# Patient Record
Sex: Female | Born: 1970
Health system: Southern US, Community
[De-identification: ages and names within clinical notes are randomized; demographics above are authoritative.]

## PROBLEM LIST (undated history)

## (undated) DIAGNOSIS — J45909 Unspecified asthma, uncomplicated: Secondary | ICD-10-CM

## (undated) DIAGNOSIS — E782 Mixed hyperlipidemia: Secondary | ICD-10-CM

## (undated) DIAGNOSIS — J309 Allergic rhinitis, unspecified: Secondary | ICD-10-CM

## (undated) DIAGNOSIS — T7840XA Allergy, unspecified, initial encounter: Secondary | ICD-10-CM

## (undated) DIAGNOSIS — R7611 Nonspecific reaction to tuberculin skin test without active tuberculosis: Secondary | ICD-10-CM

## (undated) DIAGNOSIS — R011 Cardiac murmur, unspecified: Secondary | ICD-10-CM

## (undated) DIAGNOSIS — I10 Essential (primary) hypertension: Secondary | ICD-10-CM

## (undated) DIAGNOSIS — M1990 Unspecified osteoarthritis, unspecified site: Secondary | ICD-10-CM

## (undated) DIAGNOSIS — E559 Vitamin D deficiency, unspecified: Secondary | ICD-10-CM

## (undated) HISTORY — DX: Unspecified asthma, uncomplicated: J45.909

## (undated) HISTORY — DX: Vitamin D deficiency, unspecified: E55.9

## (undated) HISTORY — DX: Mixed hyperlipidemia: E78.2

## (undated) HISTORY — DX: Nonspecific reaction to tuberculin skin test without active tuberculosis: R76.11

## (undated) HISTORY — DX: Cardiac murmur, unspecified: R01.1

## (undated) HISTORY — DX: Essential (primary) hypertension: I10

## (undated) HISTORY — DX: Allergic rhinitis, unspecified: J30.9

## (undated) HISTORY — PX: WISDOM TOOTH EXTRACTION: SHX21

## (undated) HISTORY — PX: TUBAL LIGATION: SHX77

## (undated) HISTORY — DX: Allergy, unspecified, initial encounter: T78.40XA

---

## 2003-03-28 ENCOUNTER — Other Ambulatory Visit: Admission: RE | Admit: 2003-03-28 | Discharge: 2003-03-28 | Payer: Self-pay | Admitting: Obstetrics and Gynecology

## 2003-08-30 ENCOUNTER — Encounter: Admission: RE | Admit: 2003-08-30 | Discharge: 2003-08-30 | Payer: Self-pay | Admitting: Family Medicine

## 2004-04-22 ENCOUNTER — Other Ambulatory Visit: Admission: RE | Admit: 2004-04-22 | Discharge: 2004-04-22 | Payer: Self-pay | Admitting: Obstetrics and Gynecology

## 2005-02-16 HISTORY — PX: ENDOMETRIAL ABLATION: SHX621

## 2005-04-21 ENCOUNTER — Ambulatory Visit (HOSPITAL_COMMUNITY): Admission: RE | Admit: 2005-04-21 | Discharge: 2005-04-21 | Payer: Self-pay | Admitting: Obstetrics and Gynecology

## 2005-10-03 ENCOUNTER — Emergency Department (HOSPITAL_COMMUNITY): Admission: EM | Admit: 2005-10-03 | Discharge: 2005-10-03 | Payer: Self-pay | Admitting: Emergency Medicine

## 2007-03-22 ENCOUNTER — Emergency Department (HOSPITAL_COMMUNITY): Admission: EM | Admit: 2007-03-22 | Discharge: 2007-03-22 | Payer: Self-pay | Admitting: Emergency Medicine

## 2007-06-18 ENCOUNTER — Emergency Department (HOSPITAL_COMMUNITY): Admission: EM | Admit: 2007-06-18 | Discharge: 2007-06-18 | Payer: Self-pay | Admitting: Emergency Medicine

## 2008-03-04 ENCOUNTER — Inpatient Hospital Stay (HOSPITAL_COMMUNITY): Admission: EM | Admit: 2008-03-04 | Discharge: 2008-03-06 | Payer: Self-pay | Admitting: Emergency Medicine

## 2008-03-04 ENCOUNTER — Ambulatory Visit: Payer: Self-pay | Admitting: Cardiology

## 2008-03-05 ENCOUNTER — Encounter (INDEPENDENT_AMBULATORY_CARE_PROVIDER_SITE_OTHER): Payer: Self-pay | Admitting: Obstetrics and Gynecology

## 2008-04-05 ENCOUNTER — Emergency Department (HOSPITAL_COMMUNITY): Admission: EM | Admit: 2008-04-05 | Discharge: 2008-04-05 | Payer: Self-pay | Admitting: Emergency Medicine

## 2009-07-23 ENCOUNTER — Emergency Department (HOSPITAL_COMMUNITY): Admission: EM | Admit: 2009-07-23 | Discharge: 2009-07-23 | Payer: Self-pay | Admitting: Emergency Medicine

## 2009-11-04 ENCOUNTER — Emergency Department (HOSPITAL_COMMUNITY)
Admission: EM | Admit: 2009-11-04 | Discharge: 2009-11-04 | Payer: Self-pay | Source: Home / Self Care | Admitting: Emergency Medicine

## 2010-02-20 ENCOUNTER — Ambulatory Visit (HOSPITAL_COMMUNITY)
Admission: RE | Admit: 2010-02-20 | Discharge: 2010-02-20 | Payer: Self-pay | Source: Home / Self Care | Attending: Sports Medicine | Admitting: Sports Medicine

## 2010-03-29 ENCOUNTER — Inpatient Hospital Stay (INDEPENDENT_AMBULATORY_CARE_PROVIDER_SITE_OTHER)
Admission: RE | Admit: 2010-03-29 | Discharge: 2010-03-29 | Disposition: A | Discharge status: Home / Self Care | Payer: 59 | Source: Ambulatory Visit | Attending: Emergency Medicine | Admitting: Emergency Medicine

## 2010-03-29 DIAGNOSIS — J019 Acute sinusitis, unspecified: Secondary | ICD-10-CM

## 2010-03-29 LAB — POCT RAPID STREP A (OFFICE): Streptococcus, Group A Screen (Direct): NEGATIVE

## 2010-05-04 ENCOUNTER — Emergency Department (HOSPITAL_COMMUNITY): Payer: 59

## 2010-05-04 ENCOUNTER — Emergency Department (HOSPITAL_COMMUNITY)
Admission: EM | Admit: 2010-05-04 | Discharge: 2010-05-04 | Disposition: A | Discharge status: Home / Self Care | Payer: 59 | Attending: Emergency Medicine | Admitting: Emergency Medicine

## 2010-05-04 DIAGNOSIS — R110 Nausea: Secondary | ICD-10-CM | POA: Insufficient documentation

## 2010-05-04 DIAGNOSIS — K7689 Other specified diseases of liver: Secondary | ICD-10-CM | POA: Insufficient documentation

## 2010-05-04 DIAGNOSIS — R1032 Left lower quadrant pain: Secondary | ICD-10-CM | POA: Insufficient documentation

## 2010-05-04 DIAGNOSIS — I10 Essential (primary) hypertension: Secondary | ICD-10-CM | POA: Insufficient documentation

## 2010-05-04 LAB — BASIC METABOLIC PANEL
BUN: 13 mg/dL (ref 6–23)
Creatinine, Ser: 0.97 mg/dL (ref 0.4–1.2)
GFR calc Af Amer: >60 mL/min (ref >60)

## 2010-05-04 LAB — URINALYSIS, ROUTINE W REFLEX MICROSCOPIC
Bilirubin Urine: NEGATIVE
Glucose, UA: NEGATIVE mg/dL
Specific Gravity, Urine: 1.016 (ref 1.005–1.030)
Urobilinogen, UA: 0.2 mg/dL (ref 0.0–1.0)

## 2010-05-04 LAB — CBC
Hemoglobin: 12.3 g/dL (ref 12.0–15.0)
MCV: 87.4 fL (ref 78.0–100.0)
RBC: 4.12 MIL/uL (ref 3.87–5.11)
WBC: 5.5 10*3/uL (ref 4.0–10.5)

## 2010-05-04 LAB — DIFFERENTIAL
Eosinophils Absolute: 0 10*3/uL (ref 0.0–0.7)
Lymphocytes Relative: 37 % (ref 12–46)
Lymphs Abs: 2 10*3/uL (ref 0.7–4.0)
Monocytes Absolute: 0.4 10*3/uL (ref 0.1–1.0)
Neutro Abs: 3 10*3/uL (ref 1.7–7.7)

## 2010-05-04 LAB — POCT PREGNANCY, URINE: Preg Test, Ur: NEGATIVE

## 2010-05-05 LAB — POCT PREGNANCY, URINE: Preg Test, Ur: NEGATIVE

## 2010-05-05 LAB — POCT URINALYSIS DIP (DEVICE)
Glucose, UA: NEGATIVE mg/dL
Urobilinogen, UA: 0.2 mg/dL (ref 0.0–1.0)
pH: 5 (ref 5.0–8.0)

## 2010-06-02 LAB — CARDIAC PANEL(CRET KIN+CKTOT+MB+TROPI)
CK, MB: 1.1 ng/mL (ref 0.3–4.0)
Relative Index: 0.8 (ref 0.0–2.5)
Total CK: 134 U/L (ref 7–177)

## 2010-06-02 LAB — BASIC METABOLIC PANEL
Calcium: 8.7 mg/dL (ref 8.4–10.5)
GFR calc non Af Amer: >60 mL/min (ref >60)
Potassium: 3.9 mEq/L (ref 3.5–5.1)
Sodium: 134 mEq/L — ABNORMAL LOW (ref 135–145)

## 2010-06-02 LAB — POCT I-STAT, CHEM 8
BUN: 12 mg/dL (ref 6–23)
HCT: 42 % (ref 36.0–46.0)
Hemoglobin: 14.3 g/dL (ref 12.0–15.0)
Sodium: 139 mEq/L (ref 135–145)
TCO2: 19 mmol/L (ref 0–100)

## 2010-06-02 LAB — CBC
HCT: 38.8 % (ref 36.0–46.0)
Hemoglobin: 12.9 g/dL (ref 12.0–15.0)
Platelets: 131 10*3/uL — ABNORMAL LOW (ref 150–400)
RDW: 12.9 % (ref 11.5–15.5)
WBC: 3.8 10*3/uL — ABNORMAL LOW (ref 4.0–10.5)

## 2010-06-02 LAB — DIFFERENTIAL
Basophils Relative: 0 % (ref 0–1)
Eosinophils Relative: 0 % (ref 0–5)
Lymphs Abs: 1.7 10*3/uL (ref 0.7–4.0)
Monocytes Relative: 6 % (ref 3–12)

## 2010-06-02 LAB — POCT CARDIAC MARKERS
CKMB, poc: 1.2 ng/mL (ref 1.0–8.0)
Myoglobin, poc: 46.9 ng/mL (ref 12–200)
Troponin i, poc: <0.05 ng/mL (ref 0.00–0.09)

## 2010-06-02 LAB — D-DIMER, QUANTITATIVE: D-Dimer, Quant: 0.31 ug/mL-FEU (ref 0.00–0.48)

## 2010-06-02 LAB — TROPONIN I: Troponin I: 0.01 ng/mL (ref 0.00–0.06)

## 2010-06-02 LAB — PREGNANCY, URINE: Preg Test, Ur: NEGATIVE

## 2010-06-03 LAB — COMPREHENSIVE METABOLIC PANEL
AST: 40 U/L — ABNORMAL HIGH (ref 0–37)
Albumin: 3.9 g/dL (ref 3.5–5.2)
BUN: 12 mg/dL (ref 6–23)
Calcium: 8.8 mg/dL (ref 8.4–10.5)
Chloride: 104 mEq/L (ref 96–112)
Creatinine, Ser: 0.93 mg/dL (ref 0.4–1.2)
GFR calc Af Amer: >60 mL/min (ref >60)
Total Bilirubin: 1 mg/dL (ref 0.3–1.2)
Total Protein: 8 g/dL (ref 6.0–8.3)

## 2010-06-03 LAB — CBC
HCT: 41.5 % (ref 36.0–46.0)
MCHC: 33.5 g/dL (ref 30.0–36.0)
MCV: 88.8 fL (ref 78.0–100.0)
Platelets: 131 10*3/uL — ABNORMAL LOW (ref 150–400)
RDW: 13 % (ref 11.5–15.5)
WBC: 3.5 10*3/uL — ABNORMAL LOW (ref 4.0–10.5)

## 2010-06-03 LAB — URINALYSIS, ROUTINE W REFLEX MICROSCOPIC
Bilirubin Urine: NEGATIVE
Nitrite: NEGATIVE
Specific Gravity, Urine: 1.023 (ref 1.005–1.030)
Urobilinogen, UA: 0.2 mg/dL (ref 0.0–1.0)
pH: 6 (ref 5.0–8.0)

## 2010-06-03 LAB — DIFFERENTIAL
Basophils Absolute: 0 10*3/uL (ref 0.0–0.1)
Eosinophils Relative: 0 % (ref 0–5)
Lymphocytes Relative: 41 % (ref 12–46)
Lymphs Abs: 1.4 10*3/uL (ref 0.7–4.0)
Monocytes Absolute: 0.2 10*3/uL (ref 0.1–1.0)
Monocytes Relative: 5 % (ref 3–12)
Neutro Abs: 1.9 10*3/uL (ref 1.7–7.7)

## 2010-06-03 LAB — URINE MICROSCOPIC-ADD ON

## 2010-07-01 NOTE — Discharge Summary (Signed)
Candace Walker, Candace Walker           ACCOUNT NO.:  [id number]   MEDICAL RECORD NO.:  [id number]          PATIENT TYPE:  INP   LOCATION:  2006                         FACILITY:  [facility]   PHYSICIAN:  Altha Harm, MDDATE OF BIRTH:  [birth year]   DATE OF ADMISSION:  03/04/2008  DATE OF DISCHARGE:  03/06/2008                               DISCHARGE SUMMARY   DISCHARGE DISPOSITION:  Home.   FINAL DISCHARGE DIAGNOSES:  1. Chest pain, noncardiac.  2. Osteopenia.   DISCHARGE MEDICATIONS:  Calcium 1 tab p.o. daily, vitamin D 1 tab p.o.  daily.   PROCEDURES:  None.   DIAGNOSTIC STUDIES:  1. Stress Myoview performed by [facility] was negative for any      ischemia.  2. Two-view chest x-ray which shows no acute chest disease.   CONSULTANTS:  [facility].   CODE STATUS:  Full code.   ALLERGIES:  PENICILLIN.   CHIEF COMPLAINT:  Chest pain.   HISTORY OF PRESENT ILLNESS:  Please see the H and P dictated by Dr.  Tamsen Roers for details of the HPI.   HOSPITAL COURSE:  The patient was admitted with complaints of chest  pain.  She was ruled out for resting ischemia with serial cardiac  enzymes which were all negative.  The patient then underwent a stress  test for further risk stratification and was found to be negative.  The  patient had no further chest pain while hospitalized.  The patient is  being discharged home.  She can return to work without restriction  immediately.  The patient is to follow up with her primary care  physician, Dr. Dorothyann Peng, on a p.r.n. basis.   DIETARY RESTRICTIONS:  None.   Total time spent 27 mins      Altha Harm, MD  Electronically Signed     MAM/MEDQ  D:  03/06/2008  T:  03/06/2008  Job:  [id number]   cc:   Candyce Churn. Allyne Gee, M.D.

## 2010-07-01 NOTE — H&P (Signed)
NAMEDOLORAS, TELLADO           ACCOUNT NO.:  [id number]   MEDICAL RECORD NO.:  [id number]          PATIENT TYPE:  EMS   LOCATION:  MAJO                         FACILITY:  [facility]   PHYSICIAN:  Beckey Rutter, MD  DATE OF BIRTH:  05-30-1970   DATE OF ADMISSION:  03/04/2008  DATE OF DISCHARGE:                              HISTORY & PHYSICAL   PRIMARY CARE PHYSICIAN:  Robyn N. Allyne Gee, MD   CHIEF COMPLAINT:  Chest pain.   HISTORY OF PRESENT ILLNESS:  This is a very pleasant 40 year old African  American female with no significant past history who came in today for  chest pain.  The patient is working here in the [facility] and  she came to work at 7 o'clock this morning.  She started working by  doing activity which is involved picking up some linens when she started  to feel chest pain.  The chest pain is all over her chest with radiation  to the neck and to the back.  The chest pain lasted for about 10 minutes  and associated with shortness of breath.  The patient also noticed heart  racing during the episode.  The chest pain is dull to squeezing in  nature and it is 6-7/10.  The patient was approached by the registered  nurse in [id number], who asked her to sit down and took her blood pressure.  After sitting down, she started to feel better and the chest pain  started to ease up, although she continued to feel her heart was racing.  Blood pressure as per the history at that time was found to have  systolic 163.  The patient was advised to come to the emergency room  which she did.  The patient was seen and evaluated in the emergency room  and the hospitalist service was called to further manage the patient's  condition.   PAST MEDICAL HISTORY:  The patient has a past medical history  significant for hypertension managed with diet at this time.   MEDICATION ALLERGIES:  Not known to have medication allergy.   MEDICATIONS:  None.   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   FAMILY HISTORY:  Significant for coronary artery disease on the father's  side.  Her father had myocardial infarction at age of 100 and he is now  status post CABG.  From the mother's side, the family history is  significant for diabetes and hyperlipidemia.   REVIEW OF SYSTEMS:  A 12-point review of systems is as per HPI,  otherwise review of system is unremarkable.   PHYSICAL EXAMINATION:  HEAD:  Atraumatic and normocephalic.  EYES:  PERRL.  MOUTH:  Moist.  No ulcer.  NECK:  Supple.  No JVD.  PRECORDIUM:  First and second heart sounds audible.  No added sound  appreciated.  LUNGS:  Significant for normal breath sounds bilaterally.  No  adventitious sounds, wheezes, or rhonchi appreciated.  ABDOMEN:  Soft and nontender.  Bowel sounds present.  EXTREMITIES:  No lower extremity edema.  NEUROLOGIC:  Alert and  oriented x3 moving all her extremities  spontaneously.  GENITOURINARY:  No CVA tenderness.   LABORATORY DATA:  HCG for pregnancy is negative.  D-dimer is 0.31, which  is normal.  White blood count is 4.7, hemoglobin 13.6, hematocrit is  40.3, and platelet count is 126.  Cardiac markers showing troponin less  than 0.05, myoglobin is 44.6, and CK-MB is 1.1.  Sodium is 139,  potassium 3.4, chloride 109, glucose is 111, BUN is 12, and creatinine  is 1.0.  Chest x-ray is essentially normal.  No evidence of acute  cardiopulmonary disease and heart size is normal.   ASSESSMENT AND PLAN:  A very pleasant 40 year old female with chest  pain.   PLAN:  1. The patient will be admitted for further assessment and management.  2. The patient will have cardiac enzyme every 8 hours and EKG tracings      every 8 hours.  3. I will go ahead and obtain a 2-D echo during this hospital stay.  4. The patient will be given aspirin today and we will put the patient      on Lovenox for DVT prophylaxis.  5. For GI prophylaxis, I will  start the patient on Protonix.  6. The patient will be admitted to telemetry monitor.      Beckey Rutter, MD  Electronically Signed     EME/MEDQ  D:  03/04/2008  T:  03/04/2008  Job:  [id number]   cc:   Candyce Churn. Allyne Gee, M.D.

## 2010-07-04 NOTE — Op Note (Signed)
NAMEMARYLAND, Candace Walker           ACCOUNT NO.:  [id number]   MEDICAL RECORD NO.:  [id number]          PATIENT TYPE:  AMB   LOCATION:  SDC                           FACILITY:  [facility]   PHYSICIAN:  Dineen Kid. Rana Snare, M.D.    DATE OF BIRTH:  07/02/1970   DATE OF PROCEDURE:  04/21/2005  DATE OF DISCHARGE:                                 OPERATIVE REPORT   PREOPERATIVE DIAGNOSES:  1.  Desires sterility.  2.  Abnormal uterine bleeding with menorrhagia and endometrial mass on      saline infusion ultrasound.   POSTOPERATIVE DIAGNOSES:  1.  Desires sterility.  2.  Abnormal uterine bleeding with menorrhagia and endometrial mass on      saline infusion ultrasound.   PROCEDURE:  Laparoscopic bilateral tubal ligation, hysteroscopy D&C with  NovaSure endometrial ablation.   SURGEON:  Dr. Candice Camp   ASSISTANT:  None.   INDICATIONS:  Candace Walker is a 40 year old, G2, P1, A1 with desired  sterility.  She also has been on hormonal contraception for menorrhagia.  She underwent saline infusion ultrasound which shows multiple intramural  fibroids and a small endometrial mass consistent with either a fibroid or  early polyp.  She presents for definitive surgical intervention and desires  stress D&C with NovaSure endometrial ablation, possible resection of  endometrial mass, and laparoscopic bilateral tubal ligation.  The risks and  benefits of these procedures were discussed at length which include but not  limited to risk of infection; bleeding; damage to uterus, tubes, ovaries,  bowel, bladder, possible risk this may not alleviate, the bleeding may recur  or worsen, tubal ligation failure quoted as 5:1000.  She does give her  informed consent and wishes to proceed.Marland Kitchen   FINDINGS AT THE TIME OF SURGERY:  Irregular endometrial surface, no obvious  polyp or submucosal fibroid was noted, normal-appearing ostia.  After the  NovaSure endometrial ablation, D&C, normal-appearing endometrial cavity with  no evidence of submucosal fibroid noted.  On laparoscopy, had normal-  appearing liver.  The appendix was retrocecal, normal-appearing uterus,  tubes, and ovaries.   DESCRIPTION OF PROCEDURE:  After adequate analgesia, the patient was placed  in the dorsal lithotomy position.  She is sterilely prepped and draped, the  bladder sterilely drained.  Weighted speculum was placed, tenaculum placed  on the anterior lip of the cervix.  The uterus was sounded to 9 cm.  The  cervical length sounded to 4 cm.  The cervix was dilated to a #27 Pratt  dilator.  Hysteroscope was inserted; the above findings were noted.  Gentle  curettage was performed, retrieving small fragments of endometrium.  No  submucosal fibroids were noted.  The NovaSure instrument was inserted  according to manufacturer's recommendations.  The cervical width was 3.8 cm  and after proper seating and test, NovaSure was carried out for 65 seconds  with power of 105.  The instrument was removed, and reexamination with  hysteroscope revealed good thermal burn throughout the entire cavity, normal-  appearing ostia.  A Cowen tenaculum was then placed; legs were repositioned.  Infraumbilical skin incision was made; a Veress needle  was inserted, and the  abdomen was insufflated to dullness to percussion.  An 11 mm trocar was then  inserted, and the above findings were noted by laparoscope.  The left  fallopian tube was identified by the fimbriated end, mid portion of the tube  grasped; cauterization was carried out over approximately 2-3 cm section of  the tube with good thermal burn noted visually and loss of resistance by the  Oh meter.  The right fallopian tube was identified by the fimbriated end,  mid portion of the tube grasped; cauterization was carried out over a 2-3 cm  section of the tube with good thermal burn noted and loss of resistance on  the Oh meter after careful examination of the pelvis and fallopian tubes  after good  technique was noted to be applied.  The abdomen was then  desufflated, the trocar removed.  The infraumbilical skin incision was  closed with a 0 Vicryl in an interrupted suture into the fascia and a 3-0  Vicryl Rapide subcuticular suture with good approximation, good hemostasis.  The incision was injected with 0.25% Marcaine, 10 mL total used.  The  tenaculum was removed from the cervix, noted to be hemostatic.  The patient  was stable on transfer to the recovery room.  Sponge, needle, and instrument  count was normal x3.  Estimated blood loss was minimal.  The sorbitol  deficit was 125.   DISPOSITION:  The patient will be discharged home with follow up in the  office in 2-3 weeks, sent home with emergency instruction sheet for [facility] and  laparoscopy, sent home with a prescription for Darvocet #20, told to return  for increased pain, fever, or bleeding.  To continue her current method of  contraception until her next menstrual cycle.      Dineen Kid Rana Snare, M.D.  Electronically Signed     DCL/MEDQ  D:  04/21/2005  T:  04/21/2005  Job:  [id number]

## 2010-10-29 ENCOUNTER — Emergency Department (HOSPITAL_COMMUNITY)
Admission: EM | Admit: 2010-10-29 | Discharge: 2010-10-29 | Disposition: A | Discharge status: Home / Self Care | Payer: 59 | Attending: Emergency Medicine | Admitting: Emergency Medicine

## 2010-10-29 ENCOUNTER — Emergency Department (HOSPITAL_COMMUNITY): Payer: 59

## 2010-10-29 DIAGNOSIS — Z79899 Other long term (current) drug therapy: Secondary | ICD-10-CM | POA: Insufficient documentation

## 2010-10-29 DIAGNOSIS — R002 Palpitations: Secondary | ICD-10-CM | POA: Insufficient documentation

## 2010-10-29 DIAGNOSIS — I10 Essential (primary) hypertension: Secondary | ICD-10-CM | POA: Insufficient documentation

## 2010-10-29 LAB — MAGNESIUM: Magnesium: 2.1 mg/dL (ref 1.5–2.5)

## 2010-10-29 LAB — TSH: TSH: 1.13 u[IU]/mL (ref 0.350–4.500)

## 2010-10-29 LAB — CBC
HCT: 37.9 % (ref 36.0–46.0)
Hemoglobin: 13.5 g/dL (ref 12.0–15.0)
MCH: 30.7 pg (ref 26.0–34.0)
MCHC: 35.6 g/dL (ref 30.0–36.0)
MCV: 86.1 fL (ref 78.0–100.0)
Platelets: 154 10*3/uL (ref 150–400)
RBC: 4.4 MIL/uL (ref 3.87–5.11)
RDW: 12.6 % (ref 11.5–15.5)
WBC: 5.8 10*3/uL (ref 4.0–10.5)

## 2010-10-29 LAB — BASIC METABOLIC PANEL
BUN: 11 mg/dL (ref 6–23)
CO2: 27 mEq/L (ref 19–32)
Calcium: 9.9 mg/dL (ref 8.4–10.5)
Chloride: 99 mEq/L (ref 96–112)
Creatinine, Ser: 0.91 mg/dL (ref 0.50–1.10)
GFR calc Af Amer: >60 mL/min (ref >60)
GFR calc non Af Amer: >60 mL/min (ref >60)
Glucose, Bld: 93 mg/dL (ref 70–99)
Potassium: 3.5 mEq/L (ref 3.5–5.1)
Sodium: 136 mEq/L (ref 135–145)

## 2010-10-29 LAB — DIFFERENTIAL
Basophils Absolute: 0 10*3/uL (ref 0.0–0.1)
Basophils Relative: 0 % (ref 0–1)
Eosinophils Absolute: 0 10*3/uL (ref 0.0–0.7)
Eosinophils Relative: 0 % (ref 0–5)
Lymphocytes Relative: 36 % (ref 12–46)
Lymphs Abs: 2.1 10*3/uL (ref 0.7–4.0)
Monocytes Absolute: 0.3 10*3/uL (ref 0.1–1.0)
Monocytes Relative: 6 % (ref 3–12)
Neutro Abs: 3.4 10*3/uL (ref 1.7–7.7)
Neutrophils Relative %: 58 % (ref 43–77)

## 2010-10-29 LAB — POCT I-STAT TROPONIN I: Troponin i, poc: 0 ng/mL (ref 0.00–0.08)

## 2010-12-01 ENCOUNTER — Encounter: Payer: Self-pay | Admitting: Family Medicine

## 2010-12-01 ENCOUNTER — Ambulatory Visit (INDEPENDENT_AMBULATORY_CARE_PROVIDER_SITE_OTHER): Payer: 59 | Admitting: Family Medicine

## 2010-12-01 DIAGNOSIS — J309 Allergic rhinitis, unspecified: Secondary | ICD-10-CM

## 2010-12-01 DIAGNOSIS — J45909 Unspecified asthma, uncomplicated: Secondary | ICD-10-CM

## 2010-12-01 DIAGNOSIS — Z0000 Encounter for general adult medical examination without abnormal findings: Secondary | ICD-10-CM

## 2010-12-01 DIAGNOSIS — E782 Mixed hyperlipidemia: Secondary | ICD-10-CM

## 2010-12-01 DIAGNOSIS — I10 Essential (primary) hypertension: Secondary | ICD-10-CM | POA: Insufficient documentation

## 2010-12-01 DIAGNOSIS — E559 Vitamin D deficiency, unspecified: Secondary | ICD-10-CM

## 2010-12-01 LAB — POCT URINALYSIS DIPSTICK
Glucose, UA: NEGATIVE
Nitrite, UA: NEGATIVE
Protein, UA: NEGATIVE
Urobilinogen, UA: NEGATIVE

## 2010-12-01 MED ORDER — ALBUTEROL SULFATE HFA 108 (90 BASE) MCG/ACT IN AERS: 2.0000 | INHALATION_SPRAY | Freq: Four times a day (QID) | RESPIRATORY_TRACT | Status: DC | PRN

## 2010-12-01 MED ORDER — LISINOPRIL-HYDROCHLOROTHIAZIDE 20-12.5 MG PO TABS: 1.0000 | ORAL_TABLET | Freq: Every day | ORAL | Status: DC

## 2010-12-01 NOTE — Progress Notes (Signed)
Candace Walker is a 40 y.o. female who presents for a complete physical.  She has appt with her GYN in November. She has the following concerns: Sometimes has allergic reaction to flowers (that are in patient's room at work), sometimes requiring her to go to Urgent Care for nebulizer treatments.  This occurs despite her taking Zyrtec daily, and having an inhaler to use as needed.  Has reactions at least 3 times a year, but not always requiring UC treatment.  She would like new FMLA form on file (has previously gotten).  Follow up on HTN:  Lately, BP's have been running 130-145/mid-80's.  Denies headaches, dizziness, chest pain, edema She had labs done through [facility], and recalls that her cholesterol was high.  She is trying to limit cholesterol and sodium in her diet.  Went on a "diet" last week to try and lose weight.    There is no immunization history on file for this patient. She states immunizations are UTD.  Got flu shot at [facility].  Got tetanus at [facility], but can't recall date or type Last Pap smear: 01/2010 Last mammogram: 01/2010 Last colonoscopy: never Last DEXA: never Ophtho: yearly Dentist: every 6 months Exercise: 3 times/week goes to gym (elliptical, weights, crunches)  Past Medical History  Diagnosis Date  . Allergic rhinitis, cause unspecified   . Asthma with allergic rhinitis   . Essential hypertension, benign   . Mixed hyperlipidemia   . Unspecified vitamin D deficiency   . Positive PPD     h/o BCG vaccine    Past Surgical History  Procedure Date  . Tubal ligation   . Endometrial ablation     Dr. Rana Snare    History   Social History  . Marital Status: Legally Separated    Spouse Name: N/A    Number of Children: 2  . Years of Education: N/A   Occupational History  . Nurse [employer]   Social History Main Topics  . Smoking status: Never Smoker   . Smokeless tobacco: Never Used  . Alcohol Use: Yes     occasional (once a month)  .  Drug Use: No  . Sexually Active: Not Currently   Other Topics Concern  . Not on file   Social History Narrative   Lives with her son; no pets. Separated from her husband.  Daughter is in the army, living in [place]    Family History  Problem Relation Age of Onset  . Diabetes Mother   . Hypertension Mother   . Allergies Mother   . Heart disease Father 82    MI  . Hyperlipidemia Father   . Hypertension Father   . Cancer Paternal Grandmother     breast cancer in 70's   Current outpatient prescriptions:acetaminophen (TYLENOL) 500 MG tablet, Take 500 mg by mouth every 6 (six) hours as needed.  , Disp: , Rfl: ;  cetirizine (ZYRTEC) 10 MG tablet, Take 10 mg by mouth daily.  , Disp: , Rfl: ;  cholecalciferol (VITAMIN D) 1000 UNITS tablet, Take 1,000 Units by mouth daily.  , Disp: , Rfl: ;  fish oil-omega-3 fatty acids 1000 MG capsule, Take 1 g by mouth daily.  , Disp: , Rfl:  Multiple Vitamins-Minerals (MULTIVITAMIN WITH MINERALS) tablet, Take 1 tablet by mouth daily.  , Disp: , Rfl: ;  albuterol (PROVENTIL HFA;VENTOLIN HFA) 108 (90 BASE) MCG/ACT inhaler, Inhale 2 puffs into the lungs every 6 (six) hours as needed for wheezing., Disp: 18  g, Rfl: 1;  lisinopril-hydrochlorothiazide (PRINZIDE,ZESTORETIC) 20-12.5 MG per tablet, Take 1 tablet by mouth daily., Disp: 90 tablet, Rfl: 1  Allergies  Allergen Reactions  . Penicillins Hives  . Percocet (Oxycodone-Acetaminophen) Itching   ROS: The patient denies anorexia, fever, weight changes, headaches,  vision changes, decreased hearing, ear pain, sore throat, breast concerns, chest pain,  dizziness, syncope, dyspnea on exertion, cough, swelling, vomiting, diarrhea, constipation, abdominal pain, melena, hematochezia, indigestion/heartburn, hematuria, incontinence, dysuria, irregular menstrual cycles (they are very light), vaginal discharge, odor or itch, genital lesions, joint pains, numbness, tingling, weakness, tremor, suspicious skin lesions, depression,  anxiety, abnormal bleeding/bruising, or enlarged lymph nodes. +palpitations last month--went to ER and was told everything was fine Recalls slightly low iron last year--chart reviewed:  Had slightly low WBC in 2012, normal CBC in March and September of 2012; Hg always normal. Sometimes feels nauseated with eating (infrequent, sudden onset)  PHYSICAL EXAM: BP 132/98  Pulse 80  Temp(Src) 98.3 F (36.8 C) (Oral)  Ht 5\' 9"  (1.753 m)  Wt 236 lb (107.049 kg)  BMI 34.85 kg/m2  General Appearance:    Alert, cooperative, no distress, appears stated age  Head:    Normocephalic, without obvious abnormality, atraumatic  Eyes:    PERRL, conjunctiva/corneas clear, EOM's intact, fundi    benign  Ears:    Normal TM's and external ear canals  Nose:   Nares normal, mucosa normal, no drainage or sinus   tenderness  Throat:   Lips, mucosa, and tongue normal; teeth and gums normal  Neck:   Supple, no lymphadenopathy;  thyroid:  no   enlargement/tenderness/nodules; no carotid   bruit or JVD  Back:    Spine nontender, no curvature, ROM normal, no CVA     tenderness  Lungs:     Clear to auscultation bilaterally without wheezes, rales or     ronchi; respirations unlabored  Chest Wall:    No tenderness or deformity   Heart:    Regular rate and rhythm, S1 and S2 normal, no murmur, rub   or gallop  Breast Exam:    Deferred to GYN  Abdomen:     Soft, non-tender, nondistended, normoactive bowel sounds,    no masses, no hepatosplenomegaly  Genitalia:    Deferred to GYN     Extremities:   No clubbing, cyanosis or edema  Pulses:   2+ and symmetric all extremities  Skin:   Skin color, texture, turgor normal, no rashes or lesions  Lymph nodes:   Cervical, supraclavicular, and axillary nodes normal  Neurologic:   CNII-XII intact, normal strength, sensation and gait; reflexes 2+ and symmetric throughout          Psych:   Normal mood, affect, hygiene and grooming.     ASSESSMENT/PLAN:  1. Unspecified vitamin D  deficiency  Vitamin D 25 hydroxy  2. Mixed hyperlipidemia  Lipid panel  3. Essential hypertension, benign  Comprehensive metabolic panel, lisinopril-hydrochlorothiazide (PRINZIDE,ZESTORETIC) 20-12.5 MG per tablet  4. Asthma with allergic rhinitis  albuterol (PROVENTIL HFA;VENTOLIN HFA) 108 (90 BASE) MCG/ACT inhaler  5. Allergic rhinitis, cause unspecified    6. Physical exam, routine  POCT Urinalysis Dipstick   HTN--suboptimally controlled.  Increase lisinopril HCTZ to 20-12.5.  Check BP elsewhere, and bring list to f/u Allergies--continue daily Zyrtec Asthma--refill ProAir HFA  Hematuria--microscopic. (trace). Recalls having at GYN in the past (years ago).  If has persistent hematuria noted at GYN next month, may need further workup.  Discussed monthly self breast exams and yearly mammograms  after the age of 1; at least 30 minutes of aerobic activity at least 5 days/week; proper sunscreen use reviewed; healthy diet, including goals of calcium and vitamin D intake and alcohol recommendations (less than or equal to 1 drink/day) reviewed; regular seatbelt use; changing batteries in smoke detectors.  Immunization recommendations discussed--check employee health regarding date/type of tetanus (and we will check [facility] records when they come).  Colonoscopy recommendations reviewed--age 48

## 2010-12-01 NOTE — Patient Instructions (Addendum)
HEALTH MAINTENANCE RECOMMENDATIONS:  It is recommended that you get at least 30 minutes of aerobic exercise at least 5 days/week (for weight loss, you may need as much as 60-90 minutes). This can be any activity that gets your heart rate up. This can be divided in 10-15 minute intervals if needed, but try and build up your endurance at least once a week.  Weight bearing exercise is also recommended twice weekly.  Eat a healthy diet with lots of vegetables, fruits and fiber.  "Colorful" foods have a lot of vitamins (ie green vegetables, tomatoes, red peppers, etc).  Limit sweet tea, regular sodas and alcoholic beverages, all of which has a lot of calories and sugar.  Up to 1 alcoholic drink daily may be beneficial for women (unless trying to lose weight, watch sugars).  Drink a lot of water.  Calcium recommendations are 1200-1500 mg daily (1500 mg for postmenopausal women or women without ovaries), and vitamin D 1000 IU daily.  This should be obtained from diet and/or supplements (vitamins), and calcium should not be taken all at once, but in divided doses.  Monthly self breast exams and yearly mammograms for women over the age of 67 is recommended.  Sunscreen of at least SPF 30 should be used on all sun-exposed parts of the skin when outside between the hours of 10 am and 4 pm (not just when at beach or pool, but even with exercise, golf, tennis, and yard work!)  Use a sunscreen that says "broad spectrum" so it covers both UVA and UVB rays, and make sure to reapply every 1-2 hours.  Remember to change the batteries in your smoke detectors when changing your clock times in the spring and fall.  Use your seat belt every time you are in a car, and please drive safely and not be distracted with cell phones and texting while driving.  You had trace blood in your urine today.  You may need further evaluation for this if it persists--let me know if there is persistent blood in the urine after your visit to  your gynecologist in November.

## 2010-12-02 LAB — COMPREHENSIVE METABOLIC PANEL
ALT: 20 U/L (ref 0–35)
AST: 26 U/L (ref 0–37)
CO2: 23 mEq/L (ref 19–32)
Calcium: 9.4 mg/dL (ref 8.4–10.5)
Chloride: 104 mEq/L (ref 96–112)
Creat: 0.93 mg/dL (ref 0.50–1.10)
Sodium: 136 mEq/L (ref 135–145)
Total Bilirubin: 0.5 mg/dL (ref 0.3–1.2)
Total Protein: 7.4 g/dL (ref 6.0–8.3)

## 2010-12-02 LAB — LIPID PANEL
Total CHOL/HDL Ratio: 5.6 Ratio
VLDL: 31 mg/dL (ref 0–40)

## 2010-12-02 LAB — VITAMIN D 25 HYDROXY (VIT D DEFICIENCY, FRACTURES): Vit D, 25-Hydroxy: 31 ng/mL (ref 30–89)

## 2010-12-03 ENCOUNTER — Encounter: Payer: Self-pay | Admitting: Family Medicine

## 2010-12-03 DIAGNOSIS — E782 Mixed hyperlipidemia: Secondary | ICD-10-CM

## 2010-12-10 ENCOUNTER — Telehealth: Payer: Self-pay | Admitting: *Deleted

## 2010-12-10 NOTE — Telephone Encounter (Signed)
Left message for patient to come and pick up paperwork that Dr.Knapp filled out.

## 2010-12-15 ENCOUNTER — Encounter: Payer: Self-pay | Admitting: *Deleted

## 2010-12-25 ENCOUNTER — Encounter: Payer: Self-pay | Admitting: Family Medicine

## 2010-12-25 ENCOUNTER — Ambulatory Visit (INDEPENDENT_AMBULATORY_CARE_PROVIDER_SITE_OTHER): Payer: 59 | Admitting: Family Medicine

## 2010-12-25 VITALS — BP 130/80 | HR 72 | Temp 99.0°F | Ht 69.0 in | Wt 234.0 lb

## 2010-12-25 DIAGNOSIS — J029 Acute pharyngitis, unspecified: Secondary | ICD-10-CM

## 2010-12-25 DIAGNOSIS — R509 Fever, unspecified: Secondary | ICD-10-CM

## 2010-12-25 DIAGNOSIS — R599 Enlarged lymph nodes, unspecified: Secondary | ICD-10-CM

## 2010-12-25 MED ORDER — AZITHROMYCIN 250 MG PO TABS: ORAL_TABLET | ORAL | Status: AC

## 2010-12-25 NOTE — Patient Instructions (Signed)
Take antibiotics as prescribed--remember they continue to work for 5 days after finishing them.  Continue fever/pain control with ibuprofen Pain should improve over the next 3-4 days, although enlarged glands may take a week or two to completely go down  Follow up if ongoing pain, fevers, persistent swollen glands, or other concerns

## 2010-12-25 NOTE — Progress Notes (Signed)
Patient presents for evaluation of swollen glands on right side.  First noticed 5 days ago, but has gotten more painful over the last few days.  Also having right-sided facial pressure and pain, which started 3-4 days ago.  Ibuprofen temporarily helps reduce the pain.  Has had some low grade fevers, 99.8.  Stuffy nose, some postnasal drip.  Denies cough, shortness of breath.  Having some intermittent hoarseness.  Had sore throat 2 days ago, feeling better now.  She is taking Ibuprofen 800mg  three times a day, and having these symptoms and LG fevers despite meds. Denies sick contacts.  HTN--BP's at work is running 130/80 since dose changed.  Denies headache, chest pain.  Past Medical History  Diagnosis Date  . Allergic rhinitis, cause unspecified   . Asthma with allergic rhinitis   . Essential hypertension, benign   . Mixed hyperlipidemia   . Unspecified vitamin D deficiency   . Positive PPD     h/o BCG vaccine    Past Surgical History  Procedure Date  . Tubal ligation   . Endometrial ablation     Dr. Rana Snare    History   Social History  . Marital Status: Legally Separated    Spouse Name: N/A    Number of Children: 2  . Years of Education: N/A   Occupational History  . Nurse [employer]   Social History Main Topics  . Smoking status: Never Smoker   . Smokeless tobacco: Never Used  . Alcohol Use: Yes     occasional (once a month)  . Drug Use: No  . Sexually Active: Not Currently   Other Topics Concern  . Not on file   Social History Narrative   Lives with her son; no pets. Separated from her husband.  Daughter is in the army, living in [place]    Family History  Problem Relation Age of Onset  . Diabetes Mother   . Hypertension Mother   . Allergies Mother   . Heart disease Father 3    MI  . Hyperlipidemia Father   . Hypertension Father   . Cancer Paternal Grandmother     breast cancer in 14's   Current Outpatient Prescriptions on File Prior to Visit  Medication  Sig Dispense Refill  . albuterol (PROVENTIL HFA;VENTOLIN HFA) 108 (90 BASE) MCG/ACT inhaler Inhale 2 puffs into the lungs every 6 (six) hours as needed for wheezing.  18 g  1  . cetirizine (ZYRTEC) 10 MG tablet Take 10 mg by mouth daily.        . cholecalciferol (VITAMIN D) 1000 UNITS tablet Take 1,000 Units by mouth daily.        . fish oil-omega-3 fatty acids 1000 MG capsule Take 1 g by mouth daily.        Marland Kitchen lisinopril-hydrochlorothiazide (PRINZIDE,ZESTORETIC) 20-12.5 MG per tablet Take 1 tablet by mouth daily.  90 tablet  1  . Multiple Vitamins-Minerals (MULTIVITAMIN WITH MINERALS) tablet Take 1 tablet by mouth daily.          Allergies  Allergen Reactions  . Penicillins Hives  . Percocet (Oxycodone-Acetaminophen) Itching    ROS:  Denies nausea, vomiting, abdominal pain, diarrhea, skin rashes.  See HPI  PHYSICAL EXAM: BP 130/80  Pulse 72  Temp(Src) 99 F (37.2 C) (Oral)  Ht 5\' 9"  (1.753 m)  Wt 234 lb (106.142 kg)  BMI 34.56 kg/m2  LMP 12/15/2010 Well developed, pleasant female, in no acute distress HEENT:  PERRL, EOMI, conjunctiva clear. TM's and  EAC's normal.  OP--tonsils mildly enlarged with some erythema.  Sinuses nontender.  Nasal mucosa mildly edematous, no purulence.  Neck: +tender anterior cervical LN on left.  No overlying erythema, warmth or fluctuance Heart: regular rate and rhythm without murmur Lungs: clear bilaterally  ASSESSMENT/PLAN:  1. Lymph nodes enlarged    2. Pharyngitis  azithromycin (ZITHROMAX Z-PAK) 250 MG tablet  3. Fever     Continue fever/pain control with ibuprofen Pain should improve over the next 3-4 days, although enlarged glands may take a week or two to completely go down  Follow up if ongoing pain, fevers, persistent swollen glands, or other concerns

## 2011-01-01 ENCOUNTER — Ambulatory Visit: Payer: 59 | Admitting: Family Medicine

## 2011-05-04 ENCOUNTER — Other Ambulatory Visit: Payer: Self-pay | Admitting: Family Medicine

## 2011-05-04 ENCOUNTER — Ambulatory Visit (INDEPENDENT_AMBULATORY_CARE_PROVIDER_SITE_OTHER): Payer: 59 | Admitting: Family Medicine

## 2011-05-04 VITALS — BP 120/80 | HR 99 | Wt 238.0 lb

## 2011-05-04 DIAGNOSIS — M546 Pain in thoracic spine: Secondary | ICD-10-CM

## 2011-05-04 DIAGNOSIS — M549 Dorsalgia, unspecified: Secondary | ICD-10-CM

## 2011-05-04 MED ORDER — CARISOPRODOL 350 MG PO TABS: 350.0000 mg | ORAL_TABLET | Freq: Four times a day (QID) | ORAL | Status: DC | PRN

## 2011-05-04 NOTE — Patient Instructions (Signed)
Heat to your back for 20 minutes 3 times a day. 800 mg of ibuprofen 3 times per day the next 10 days and then let me know how you're doing. Get worse let me know sooner. Use a muscle relaxer at night and you can use it during the day but it can make you drowsy

## 2011-05-04 NOTE — Progress Notes (Signed)
  Subjective:    Patient ID: Candace Walker, female    DOB: 1971-01-16, 41 y.o.   MRN: [id number]  HPI She complains of a four-day history of right upper back pain with occasional radiation down to her forearm. Certain motions of her neck does cause upper back pain but does not radiate down her arm. She's had no weakness, numbness or tingling in her arm. She has used a little bit of heat and 800 mg of ibuprofen on occasion.   Review of Systems     Objective:   Physical Exam Full motion of her neck with no pain. No palpable tenderness of her neck. Right upper back area shows a tender area but no true trigger point. Normal motor, sensory and DTRs.      Assessment & Plan:   1. Upper back pain on right side    recommend heat, ibuprofen 800 mg 3 times a day. Will also give soma compound to be used mainly at night. She is to call if the condition worsens or call me in 10 days to let she's doing

## 2011-06-24 ENCOUNTER — Encounter: Payer: Self-pay | Admitting: Family Medicine

## 2011-06-25 ENCOUNTER — Ambulatory Visit: Payer: 59 | Admitting: Family Medicine

## 2011-07-10 ENCOUNTER — Ambulatory Visit: Payer: 59 | Admitting: Family Medicine

## 2011-07-10 ENCOUNTER — Encounter: Payer: Self-pay | Admitting: Family Medicine

## 2011-07-10 ENCOUNTER — Ambulatory Visit (INDEPENDENT_AMBULATORY_CARE_PROVIDER_SITE_OTHER): Payer: 59 | Admitting: Family Medicine

## 2011-07-10 VITALS — BP 122/82 | HR 84 | Temp 98.5°F | Ht 67.25 in | Wt 237.0 lb

## 2011-07-10 DIAGNOSIS — I10 Essential (primary) hypertension: Secondary | ICD-10-CM

## 2011-07-10 DIAGNOSIS — M791 Myalgia, unspecified site: Secondary | ICD-10-CM

## 2011-07-10 DIAGNOSIS — IMO0001 Reserved for inherently not codable concepts without codable children: Secondary | ICD-10-CM

## 2011-07-10 DIAGNOSIS — J309 Allergic rhinitis, unspecified: Secondary | ICD-10-CM

## 2011-07-10 DIAGNOSIS — J45909 Unspecified asthma, uncomplicated: Secondary | ICD-10-CM

## 2011-07-10 DIAGNOSIS — Z79899 Other long term (current) drug therapy: Secondary | ICD-10-CM

## 2011-07-10 LAB — BASIC METABOLIC PANEL
BUN: 14 mg/dL (ref 6–23)
Creat: 0.94 mg/dL (ref 0.50–1.10)
Glucose, Bld: 86 mg/dL (ref 70–99)

## 2011-07-10 LAB — CK: Total CK: 145 U/L (ref 7–177)

## 2011-07-10 MED ORDER — ALBUTEROL SULFATE HFA 108 (90 BASE) MCG/ACT IN AERS: 2.0000 | INHALATION_SPRAY | Freq: Four times a day (QID) | RESPIRATORY_TRACT | Status: DC | PRN

## 2011-07-10 NOTE — Progress Notes (Signed)
Chief Complaint  Patient presents with  . sinuses and allergies    sinus.allergies going on 4 days.muscle pain at night on shoulder, aches in arms for 2 weeks. feet is swelling in the past 2 weeks,    HPI: 4 days ago, woke up with itchy throat, then developed laryngitis, then started getting sinus headaches.  Nose is stuffy.  Denies runny nose or postnasal drainage.  Having some shortness of breath if she talks a lot.  Started coughing this morning, only occasional.  When she blows her nose, mucus is clear/white, mixed with blood today and yesterday.  Cough is dry, not productive.  Had a fever 2 days ago, 101.  Last dose of tylenol was yesterday evening.  Used her inhaler last night and that helped with the shortness of breath.  Also got short of breath walking through parking lot today--didn't have inhaler with her. +itchy throat, no pain with swallowing.  Has been using zyrtec, and it seems to help some at night  No sick household contacts.  Has a sick coworker (hasn't worked with her this week though until today).  Complaining of aching shoulders at night, and biceps aching intermittently, mainly at night.  Pains started about 2 weeks ago, comes and goes, but more persistent at night.  Takes 800mg  of ibuprofen at night, and only gets partial relief of the L>R arm/bicep pain.  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  Only hurting for 2 weeks, has been doing same job for many years.  Past Medical History  Diagnosis Date  . Allergic rhinitis, cause unspecified   . Asthma with allergic rhinitis   . Essential hypertension, benign   . Mixed hyperlipidemia   . Unspecified vitamin D deficiency   . Positive PPD     h/o BCG vaccine   History   Social History  . Marital Status: Legally Separated    Spouse Name: N/A    Number of Children: 2  . Years of Education: N/A   Occupational History  . Nurse [employer]   Social History Main Topics  .  Smoking status: Never Smoker   . Smokeless tobacco: Never Used  . Alcohol Use: Yes     occasional (once a month)  . Drug Use: No  . Sexually Active: Not Currently   Other Topics Concern  . Not on file   Social History Narrative   Lives with her son; no pets. Separated from her husband.  Daughter is in the army, living in [place]   Current Outpatient Prescriptions on File Prior to Visit  Medication Sig Dispense Refill  . albuterol (PROVENTIL HFA;VENTOLIN HFA) 108 (90 BASE) MCG/ACT inhaler Inhale 2 puffs into the lungs every 6 (six) hours as needed for wheezing.  18 g  1  . cetirizine (ZYRTEC) 10 MG tablet Take 10 mg by mouth daily.        . cholecalciferol (VITAMIN D) 1000 UNITS tablet Take 1,000 Units by mouth daily.        . fish oil-omega-3 fatty acids 1000 MG capsule Take 1 g by mouth daily.        Marland Kitchen ibuprofen (ADVIL,MOTRIN) 800 MG tablet Take 800 mg by mouth every 8 (eight) hours as needed.        Marland Kitchen lisinopril-hydrochlorothiazide (PRINZIDE,ZESTORETIC) 20-12.5 MG per tablet TAKE 1 TABLET BY MOUTH ONCE DAILY  90 tablet  0  . Multiple Vitamins-Minerals (MULTIVITAMIN WITH MINERALS) tablet Take 1 tablet by mouth  daily.         Allergies  Allergen Reactions  . Penicillins Hives  . Percocet (Oxycodone-Acetaminophen) Itching   ROS: Denies nausea, vomiting, diarrhea.  Denies chest pain.  +URI/allergy symptoms and SOB per HPI. Denies rash  PHYSICAL EXAM: BP 122/82  Pulse 84  Temp(Src) 98.5 F (36.9 C) (Oral)  Ht 5' 7.25" (1.708 m)  Wt 237 lb (107.502 kg)  BMI 36.84 kg/m2 Well developed, pleasant female in no distress.  Some mild laryngitis (voice squeaky, high pitched).  No coughing during exam. HEENT:  PERRL, EOMI. Small medial pterygium on right, medially. TM's and EAC's normal bilaterally OP clear. Nasal mucosa mildly edematous, no erythema.  Sinuses nontender Neck: no lymphadenopathy Heart: regular rate and rhythm Lungs: clear bilaterally with good air movement.  No wheeze or  cough with forced expiration. Extremities and neuro:  No edema, 2+ pulses. Full strength in upper extremities.  Pain with deltoid testing in deltoid area.  No pain with use/testing of biceps/triceps. Muscles nontender to palpation. 2+ reflexes in upper extremities Abdomen: soft, nontender Skin: no rash  ASSESSMENT/PLAN: 1. Allergic rhinitis, cause unspecified    2. Myalgia  CK  3. Encounter for long-term (current) use of other medications  Basic metabolic panel  4. Essential hypertension, benign  Basic metabolic panel  5. Asthma with allergic rhinitis  albuterol (PROVENTIL HFA;VENTOLIN HFA) 108 (90 BASE) MCG/ACT inhaler   Allergies--underlying, overall stable. URI--viral, acute Continue zyrtec, continue sinus rinses. Consider Mucinex if secretions are very thick Use inhaler if needed for wheezing--make sure to carry it with you.  Muscle aches--Make sure you drink plenty of fluids.  We are checking labs to make sure muscle enzymes and electrolytes are normal.  Try and do stretches, and apply heat as needed for pain, and it is okay to continue with anti-inflammatories such as ibuprofen when needed.  Due for fasting lipid.  Rx given for fasting lipids to be drawn at work

## 2011-07-10 NOTE — Patient Instructions (Signed)
It appears that you have a viral upper respiratory illness.  There is no evidence of bacterial sinus infection, bronchitis or pneumonia. Continue zyrtec, continue sinus rinses. Consider Mucinex if secretions are very thick Use inhaler if needed for wheezing--make sure to carry it with you.  Muscle aches--Make sure you drink plenty of fluids.  We are checking labs to make sure muscle enzymes and electrolytes are normal.  Try and do stretches, and apply heat as needed for pain, and it is okay to continue with anti-inflammatories such as ibuprofen when needed. Due for fasting lipids--you were given a prescription so that you can get it drawn at the hospital.  Make sure they assign it to Dr. Lynelle Doctor in the computer so that I get the results.

## 2011-07-11 ENCOUNTER — Encounter: Payer: Self-pay | Admitting: Family Medicine

## 2011-08-03 ENCOUNTER — Ambulatory Visit: Payer: 59 | Admitting: Family Medicine

## 2011-08-28 ENCOUNTER — Telehealth: Payer: Self-pay | Admitting: Family Medicine

## 2011-08-28 MED ORDER — IBUPROFEN 800 MG PO TABS: 800.0000 mg | ORAL_TABLET | Freq: Three times a day (TID) | ORAL | Status: DC | PRN

## 2011-08-28 NOTE — Telephone Encounter (Signed)
SENT IN MED  

## 2011-09-02 ENCOUNTER — Other Ambulatory Visit: Payer: Self-pay | Admitting: Family Medicine

## 2011-09-22 ENCOUNTER — Other Ambulatory Visit: Payer: 59

## 2011-09-22 DIAGNOSIS — E782 Mixed hyperlipidemia: Secondary | ICD-10-CM

## 2011-09-22 LAB — LIPID PANEL
Cholesterol: 216 mg/dL — ABNORMAL HIGH (ref 0–200)
VLDL: 40 mg/dL (ref 0–40)

## 2011-10-28 ENCOUNTER — Institutional Professional Consult (permissible substitution): Payer: 59 | Admitting: Family Medicine

## 2011-11-17 ENCOUNTER — Other Ambulatory Visit: Payer: Self-pay | Admitting: Family Medicine

## 2011-11-19 ENCOUNTER — Ambulatory Visit (INDEPENDENT_AMBULATORY_CARE_PROVIDER_SITE_OTHER): Payer: 59 | Admitting: Family Medicine

## 2011-11-19 ENCOUNTER — Encounter: Payer: Self-pay | Admitting: Family Medicine

## 2011-11-19 VITALS — BP 110/80 | HR 72 | Ht 67.25 in | Wt 235.0 lb

## 2011-11-19 DIAGNOSIS — E559 Vitamin D deficiency, unspecified: Secondary | ICD-10-CM

## 2011-11-19 DIAGNOSIS — Z23 Encounter for immunization: Secondary | ICD-10-CM

## 2011-11-19 DIAGNOSIS — E782 Mixed hyperlipidemia: Secondary | ICD-10-CM

## 2011-11-19 DIAGNOSIS — I10 Essential (primary) hypertension: Secondary | ICD-10-CM

## 2011-11-19 NOTE — Patient Instructions (Addendum)
   Lab Results  Component Value Date   CHOL 216* 09/22/2011   HDL 39* 09/22/2011   LDLCALC 137* 09/22/2011   TRIG 199* 09/22/2011   CHOLHDL 5.5 09/22/2011    Please try and exercise at least 30 minutes every day. Increase your fish oil to 4000 mg daily (you can take 2 capsules twice daily) Cut back on sweets, sugars, fried foods.  Continue low cholesterol diet.  Return in 6 months for your physical (due on October, but we'll make it in April, when next labs are due)--do your fasting labs prior to your appointment so we can review the labs at visit.  Goal LDL<130; goal HDL>50, if possible Goal chol/HDL ratio is <4

## 2011-11-19 NOTE — Progress Notes (Signed)
Chief Complaint  Patient presents with  . Follow-up    had lipid panel 09/2011-following up.   HPI: Patient presents to follow up on her lipids done in August. She is in [employer] school.  Over the summer, they had to eat what they cooked, so ate differently than normal. She states that cakes are her weakness, and she bakes a lot of bread.  Doesn't eat red meat.  Uses 2% milk.  Tries to follow lowfat, low cholesterol diet.  Past Medical History  Diagnosis Date  . Allergic rhinitis, cause unspecified   . Asthma with allergic rhinitis   . Essential hypertension, benign   . Mixed hyperlipidemia   . Unspecified vitamin D deficiency   . Positive PPD     h/o BCG vaccine   Past Surgical History  Procedure Date  . Tubal ligation   . Endometrial ablation 2007    Dr. Rana Snare   History   Social History  . Marital Status: Legally Separated    Spouse Name: N/A    Number of Children: 2  . Years of Education: N/A   Occupational History  . Nurse [employer]   Social History Main Topics  . Smoking status: Never Smoker   . Smokeless tobacco: Never Used  . Alcohol Use: Yes     occasional (once a month)  . Drug Use: No  . Sexually Active: Not Currently   Other Topics Concern  . Not on file   Social History Narrative   Lives with her son; no pets. Separated from her husband.  Daughter is in the army, living in [place]   Current Outpatient Prescriptions on File Prior to Visit  Medication Sig Dispense Refill  . cetirizine (ZYRTEC) 10 MG tablet Take 10 mg by mouth daily.        . cholecalciferol (VITAMIN D) 1000 UNITS tablet Take 1,000 Units by mouth daily.        . fish oil-omega-3 fatty acids 1000 MG capsule Take 1 g by mouth daily.        Marland Kitchen lisinopril-hydrochlorothiazide (PRINZIDE,ZESTORETIC) 20-12.5 MG per tablet TAKE 1 TABLET BY MOUTH ONCE DAILY  90 tablet  0  . Multiple Vitamins-Minerals (MULTIVITAMIN WITH MINERALS) tablet Take 1 tablet by mouth daily.        Marland Kitchen albuterol (PROVENTIL  HFA;VENTOLIN HFA) 108 (90 BASE) MCG/ACT inhaler Inhale 2 puffs into the lungs every 6 (six) hours as needed for wheezing.  18 g  1  . ibuprofen (ADVIL,MOTRIN) 800 MG tablet Take 1 tablet (800 mg total) by mouth every 8 (eight) hours as needed.  30 tablet  0   Allergies  Allergen Reactions  . Penicillins Hives  . Percocet (Oxycodone-Acetaminophen) Itching   ROS:  Denies fever, URI symptoms, headaches, dizziness, chest pain, shortness of breath, GI complaints, or other concerns  PHYSICAL EXAM: BP 110/80  Pulse 72  Ht 5' 7.25" (1.708 m)  Wt 235 lb (106.595 kg)  BMI 36.53 kg/m2  LMP 11/07/2011 Well developed, pleasant female in no distress. Remainder of visit is limited to counseling/discussion  Lab Results  Component Value Date   CHOL 216* 09/22/2011   CHOL 231* 12/01/2010   Lab Results  Component Value Date   HDL 39* 09/22/2011   HDL 41 16/11/9602   Lab Results  Component Value Date   LDLCALC 137* 09/22/2011   LDLCALC 159* 12/01/2010   Lab Results  Component Value Date   TRIG 199* 09/22/2011   TRIG 156* 12/01/2010   Lab Results  Component Value Date   CHOLHDL 5.5 09/22/2011   CHOLHDL 5.6 12/01/2010   No results found for this basename: LDLDIRECT   ASSESSMENT/PLAN:  1. Need for prophylactic vaccination and inoculation against influenza  Flu vaccine 6-32mo preservative free IM  2. Mixed hyperlipidemia  Lipid panel  3. Essential hypertension, benign  Comprehensive metabolic panel  4. Unspecified vitamin D deficiency  Vitamin D 25 hydroxy   Hyperlipidemia--overall improved, but suboptimally.  Discussed need for daily exercise, trial of 4000mg  of omega-3 fish oil daily, and continued lowfat, low cholesterol diet, as reviewed again today.  HTN--well controlled  F/u in 6 months--CPE with labs prior  (c-met, lipid, Vitamin D0

## 2011-11-23 ENCOUNTER — Telehealth: Payer: Self-pay | Admitting: Internal Medicine

## 2011-11-23 NOTE — Telephone Encounter (Signed)
This is not a typical allergic reaction to a flu shot.  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, so they can determine (and document) whether or not she can get flu shot next year

## 2011-11-23 NOTE — Telephone Encounter (Signed)
Pt advised of Dr.Knapp's recommendations.

## 2011-11-23 NOTE — Telephone Encounter (Signed)
Pt got flu shot on Thursday and woke up Friday with 4 itchy blisters on lips and roof of mouth and it also happen last year when she got the flu shot but she just thought it was just a break out until this year. Pt wants to know if having the flu shot is causing and outbreak of blisters

## 2011-12-17 ENCOUNTER — Ambulatory Visit (INDEPENDENT_AMBULATORY_CARE_PROVIDER_SITE_OTHER): Payer: 59 | Admitting: Family Medicine

## 2011-12-17 ENCOUNTER — Encounter: Payer: Self-pay | Admitting: Family Medicine

## 2011-12-17 VITALS — BP 124/94 | HR 80 | Temp 99.4°F | Ht 69.25 in | Wt 237.0 lb

## 2011-12-17 DIAGNOSIS — M62838 Other muscle spasm: Secondary | ICD-10-CM

## 2011-12-17 DIAGNOSIS — M549 Dorsalgia, unspecified: Secondary | ICD-10-CM

## 2011-12-17 MED ORDER — KETOROLAC TROMETHAMINE 60 MG/2ML IM SOLN
60.0000 mg | Freq: Once | INTRAMUSCULAR | Status: AC
  Administered 2011-12-17: 60 mg via INTRAMUSCULAR

## 2011-12-17 MED ORDER — CARISOPRODOL 350 MG PO TABS: 350.0000 mg | ORAL_TABLET | Freq: Four times a day (QID) | ORAL | Status: AC | PRN

## 2011-12-17 MED ORDER — NAPROXEN 500 MG PO TABS: 500.0000 mg | ORAL_TABLET | Freq: Two times a day (BID) | ORAL | Status: DC

## 2011-12-17 NOTE — Progress Notes (Signed)
Chief Complaint  Patient presents with  . back and arm pain    back pain and pain shooting down arm into hand. laying down at night is worse, and  get up in middle of night  pain in chest and shoulder pain on left side, been going on for months and has gotten worse   HPI:  Patient presents complaining of pain between her shoulder blades x months.  She saw Dr. Susann Givens back in March when it first started.  The soma that was prescribed seemed to help. Pain comes and goes.  Hurts if she sits up for too long, without reclining/relaxing.   She also has pain in both shoulders (tops of the shoulders) and radiates down L>R arms, down to the wrists.  No pain radiating into hands.  Denies any numbness/tingling.  2 nights ago the left arm pain was very severe.  She used a heat rub, and took ibuprofen.  She finds that the pain is worse the next day after taking ibuprofen.    She feels a pain into her sternum if she leans forward, when she gets out of bed to stand up, only at night.  Doesn't ever have chest pain during the day.  No exertional chest pain.  Denies abdominal pain from ibuprofen.  Hasn't been taking ibuprofen regularly, just took it once 2 nights ago, otherwise just takes it for her periods.   Soma didn't cause drowsiness, and she was able to take it during the day.  Past Medical History  Diagnosis Date  . Allergic rhinitis, cause unspecified   . Asthma with allergic rhinitis   . Essential hypertension, benign   . Mixed hyperlipidemia   . Unspecified vitamin D deficiency   . Positive PPD     h/o BCG vaccine   Past Surgical History  Procedure Date  . Tubal ligation   . Endometrial ablation 2007    Dr. Rana Snare   History   Social History  . Marital Status: Legally Separated    Spouse Name: N/A    Number of Children: 2  . Years of Education: N/A   Occupational History  . Nurse [employer]   Social History Main Topics  . Smoking status: Never Smoker   . Smokeless tobacco: Never  Used  . Alcohol Use: Yes     occasional (once a month)  . Drug Use: No  . Sexually Active: Not Currently   Other Topics Concern  . Not on file   Social History Narrative   Lives with her son; no pets. Separated from her husband.  Daughter is in the army, living in [place]    Current outpatient prescriptions:albuterol (PROVENTIL HFA;VENTOLIN HFA) 108 (90 BASE) MCG/ACT inhaler, Inhale 2 puffs into the lungs every 6 (six) hours as needed for wheezing., Disp: 18 g, Rfl: 1;  cetirizine (ZYRTEC) 10 MG tablet, Take 10 mg by mouth daily.  , Disp: , Rfl: ;  cholecalciferol (VITAMIN D) 1000 UNITS tablet, Take 1,000 Units by mouth daily.  , Disp: , Rfl:  fish oil-omega-3 fatty acids 1000 MG capsule, Take 1 g by mouth daily.  , Disp: , Rfl: ;  ibuprofen (ADVIL,MOTRIN) 800 MG tablet, Take 1 tablet (800 mg total) by mouth every 8 (eight) hours as needed., Disp: 30 tablet, Rfl: 0;  lisinopril-hydrochlorothiazide (PRINZIDE,ZESTORETIC) 20-12.5 MG per tablet, TAKE 1 TABLET BY MOUTH ONCE DAILY, Disp: 90 tablet, Rfl: 0 Multiple Vitamins-Minerals (MULTIVITAMIN WITH MINERALS) tablet, Take 1 tablet by mouth daily.  , Disp: ,  Rfl:   Allergies  Allergen Reactions  . Penicillins Hives  . Percocet (Oxycodone-Acetaminophen) Itching   ROS: Denies fevers, URI symptoms, sore throat, cough, abdominal pain, urinary complaints, skin rashes or other complaints except as per HPI.  PHYSICAL EXAM: BP 124/94  Pulse 80  Temp 99.4 F (37.4 C) (Oral)  Ht 5' 9.25" (1.759 m)  Wt 237 lb (107.502 kg)  BMI 34.75 kg/m2  LMP 11/07/2011 Well developed, pleasant female in no distress Neck: no lymphadenopathy.  No c-spine tenderness or muscle tenderness or spasm.  FROM Heart: regular rate and rhythm Lungs: clear bilaterally Abdomen: soft, nontender, no mass Back: no CVA tenderness, no spinal tenderness.  Area of discomfort is at rhomboids--no significant spasm noted, slightly tender on exam, pain is worse with certain positions (head  turned to R) Extremities: no edema, 2+ pulses Neuro: alert and oriented.  5/5 strength in upper extremities.  Normal sensation.  DTR's symmetric.  Negative Phalen, Tinel.  Having hands in position for Phalens made her upper arms feel sore bilaterally, but no other discomfort Skin: no rash  ASSESSMENT/PLAN: 1. Muscle spasm  naproxen (NAPROSYN) 500 MG tablet, carisoprodol (SOMA) 350 MG tablet, ketorolac (TORADOL) injection 60 mg  2. Back pain  naproxen (NAPROSYN) 500 MG tablet, carisoprodol (SOMA) 350 MG tablet, ketorolac (TORADOL) injection 60 mg   toradol 60mg  IM given Naprosyn #30, rf x 1--start tonight with dinner NSAID precautions reviewed Refill soma, side effects reviewed Continue heat. Stretches shown.  F/u if symptoms persist/worsen

## 2011-12-17 NOTE — Patient Instructions (Addendum)
Heat, stretches, massage. Use anti-inflammatory regularly for at least 7-10 days.  Take with food.  Stop or call if it bothers your stomach too much.  Don't take ibuprofen along with the naproxen.  Use the soma as needed for muscle spasm.  May cause drowsiness.  Follow up in 2 weeks if not improved.

## 2011-12-18 ENCOUNTER — Encounter: Payer: Self-pay | Admitting: Family Medicine

## 2011-12-24 ENCOUNTER — Telehealth: Payer: Self-pay | Admitting: *Deleted

## 2011-12-24 NOTE — Telephone Encounter (Signed)
Ensure pt is aware of the charge for forms (since not presented nor discussed at her recent visit--was for a completely different reason), or she can schedule OV to have forms done.  Forms have been done for asthma, but in the last year she only had 1 visit related to asthma, and forms will be filled out based on her visit history of exacerbations/flares as the questions ask (which may be different than in years past).  They should be ready for late tomorrow or Monday

## 2011-12-24 NOTE — Telephone Encounter (Signed)
Patient called and left a message on my voicemail asking if and when her FMLA papers would be ready for pickup, thanks.

## 2011-12-24 NOTE — Telephone Encounter (Signed)
Spoke with patient, she paid $25 charge over phone, I told her I would call her when paperwork was ready-either late tomorrow or Monday. Thanks.

## 2011-12-25 NOTE — Telephone Encounter (Signed)
Let message to let patient know that her form has been filled out and is upfront and available for her to pick up.

## 2011-12-25 NOTE — Telephone Encounter (Signed)
Form filled out

## 2012-01-04 ENCOUNTER — Ambulatory Visit (INDEPENDENT_AMBULATORY_CARE_PROVIDER_SITE_OTHER): Payer: 59 | Admitting: Family Medicine

## 2012-01-04 ENCOUNTER — Encounter: Payer: Self-pay | Admitting: Family Medicine

## 2012-01-04 VITALS — BP 122/74 | HR 76 | Temp 98.7°F | Ht 69.25 in | Wt 230.0 lb

## 2012-01-04 DIAGNOSIS — R05 Cough: Secondary | ICD-10-CM

## 2012-01-04 DIAGNOSIS — J069 Acute upper respiratory infection, unspecified: Secondary | ICD-10-CM

## 2012-01-04 MED ORDER — HYDROCOD POLST-CHLORPHEN POLST 10-8 MG/5ML PO LQCR: 5.0000 mL | Freq: Every evening | ORAL | Status: DC | PRN

## 2012-01-04 MED ORDER — AZITHROMYCIN 250 MG PO TABS: ORAL_TABLET | ORAL | Status: DC

## 2012-01-04 NOTE — Patient Instructions (Signed)
Take the cough syrup that is prescribed at bedtime to help suppress cough and help dry the nasal congestion.  Make sure you have 12 hours--it causes sedation.  I recommend using something with guaifenesin to help keep the mucus loose (ie robitussin or mucinex). I also recommend using sinus rinses or neti-pot as needed for sinus congestion (avoid decongestants due to high blood pressure).  Start antibiotics in 2-5 days if symptoms progressively getting worse--ie ongoing fevers, worsening cough, discolored mucus or phlegm.  Remember that if you start the antibiotic, you must finish the full course.  It continues to work for 5 days after taking the last pill.  If you are clearly worse after the antibiotics, then return for re-evaluation.

## 2012-01-04 NOTE — Progress Notes (Signed)
Chief Complaint  Patient presents with  . Cough    which is worse at night x 2 days. Sore throat since Friday. Fever x 2 days. Took some Robitussin last night.   HPI:  Began with a sore throat with a tickle 3 days ago, then developed nasal congestion and cough.  Nasal mucus is white. Cough is usually dry, but sometimes gets up phlegm, hasn't looked at the color.  Having fevers around midnight, Tmax 100.6 last night.  Took tylenol at midnight, and used robitussin DM.  Cough kept her up last night.  She hasn't used her inhaler.  Only occasionally feels a tightness in her chest, but improves with position change. (feels worse when she lies on her left side).  Only occasional back pain, much improved, no longer radiates down the arms, just occasionally has pain up by shoulder blades.  Past Medical History  Diagnosis Date  . Allergic rhinitis, cause unspecified   . Asthma with allergic rhinitis   . Essential hypertension, benign   . Mixed hyperlipidemia   . Unspecified vitamin D deficiency   . Positive PPD     h/o BCG vaccine   Past Surgical History  Procedure Date  . Tubal ligation   . Endometrial ablation 2007    Dr. Rana Snare   History   Social History  . Marital Status: Legally Separated    Spouse Name: N/A    Number of Children: 2  . Years of Education: N/A   Occupational History  . Nurse [employer]   Social History Main Topics  . Smoking status: Never Smoker   . Smokeless tobacco: Never Used  . Alcohol Use: Yes     Comment: occasional (once a month)  . Drug Use: No  . Sexually Active: Not Currently   Other Topics Concern  . Not on file   Social History Narrative   Lives with her son; no pets. Separated from her husband.  Daughter is in the army, living in [place]   Current Outpatient Prescriptions on File Prior to Visit  Medication Sig Dispense Refill  . cetirizine (ZYRTEC) 10 MG tablet Take 10 mg by mouth daily.        . cholecalciferol (VITAMIN D) 1000 UNITS tablet  Take 1,000 Units by mouth daily.        . fish oil-omega-3 fatty acids 1000 MG capsule Take 1 g by mouth daily.        Marland Kitchen lisinopril-hydrochlorothiazide (PRINZIDE,ZESTORETIC) 20-12.5 MG per tablet TAKE 1 TABLET BY MOUTH ONCE DAILY  90 tablet  0  . Multiple Vitamins-Minerals (MULTIVITAMIN WITH MINERALS) tablet Take 1 tablet by mouth daily.        Marland Kitchen albuterol (PROVENTIL HFA;VENTOLIN HFA) 108 (90 BASE) MCG/ACT inhaler Inhale 2 puffs into the lungs every 6 (six) hours as needed for wheezing.  18 g  1  . ibuprofen (ADVIL,MOTRIN) 800 MG tablet Take 1 tablet (800 mg total) by mouth every 8 (eight) hours as needed.  30 tablet  0  . naproxen (NAPROSYN) 500 MG tablet Take 1 tablet (500 mg total) by mouth 2 (two) times daily with a meal.  30 tablet  1   Allergies  Allergen Reactions  . Penicillins Hives  . Percocet (Oxycodone-Acetaminophen) Itching  Percocet makes her itch. Recalls tolerating vicodin in past.  ROS:  Denies nausea, vomiting, diarrhea, skin rash, urinary complaints.  +cough--See HPI.  Denies chest pain, palpitations. Bleeding/bruising.  Some body aches.  PHYSICAL EXAM: BP 122/74  Pulse 76  Temp 98.7 F (37.1 C) (Oral)  Ht 5' 9.25" (1.759 m)  Wt 230 lb (104.327 kg)  BMI 33.72 kg/m2  LMP 12/06/2011  Well developed pleasant female, mildly congested with occasional deep cough, and some dry cough as well. HEENT:  PERRL, EOMI, conjunctiva clear.  TM's and EAC's normal.  OP clear.  Moist mucus membranes.  Nasal mucosa mildly edematous, no erythema, no purulence.  Sinuses nontender Neck: no lymphadenopathy, thyromegaly or mass Heart: regular rate and rhythm without murmur Lungs: clear bilaterally with good air movement.  No wheezes with forced expiration. Skin: no rash  ASSESSMENT/PLAN: 1. Cough  chlorpheniramine-HYDROcodone (TUSSIONEX PENNKINETIC ER) 10-8 MG/5ML LQCR  2. URI (upper respiratory infection)  azithromycin (ZITHROMAX) 250 MG tablet   Given written rx for z-pak to use  later in the week if symptoms persist or worsen. Understands need to complete course of ABX if started. rx tussionex to use at bedtime--risks and side effects reviewed. Discussed using guaifenesin regularly, as well as sinus rinses

## 2012-02-23 ENCOUNTER — Other Ambulatory Visit: Payer: Self-pay | Admitting: Family Medicine

## 2012-05-05 ENCOUNTER — Encounter: Payer: Self-pay | Admitting: Family Medicine

## 2012-05-05 ENCOUNTER — Ambulatory Visit (INDEPENDENT_AMBULATORY_CARE_PROVIDER_SITE_OTHER): Payer: 59 | Admitting: Family Medicine

## 2012-05-05 VITALS — BP 120/72 | HR 72 | Temp 98.5°F | Ht 69.25 in | Wt 235.0 lb

## 2012-05-05 DIAGNOSIS — J45909 Unspecified asthma, uncomplicated: Secondary | ICD-10-CM

## 2012-05-05 LAB — POCT RAPID STREP A (OFFICE): Rapid Strep A Screen: NEGATIVE

## 2012-05-05 NOTE — Progress Notes (Signed)
Chief Complaint  Patient presents with  . Sore Throat    woke up 2am Tues am with sore throat. Lost her voice Tuesday evening. Has a cough that produces clear funny tasting mucus. Nasal congestion and HA.   HPI:  Awoke 2 mornings ago with a sore throat, then developed headache and nasal congestion.  Tylenol only helped for short while.  She then got an OTC sinus medication.  Has been having some cough and shortness of breath.  Needed to use inhaler twice (this morning and 2 nights ago), with good results.  Nasal mucus is white; phlegm is also white, tastes funny.  Low grade temp (99.1) yesterday. Hoarseness started last night. Denies sick contacts.  Headaches are between her eyes. Denies myalgias. Tried sinus rinse, helped just temporarily.  She monitors her BP's regularly and they have been good, even with the sinus medication.  Past Medical History  Diagnosis Date  . Allergic rhinitis, cause unspecified   . Asthma with allergic rhinitis   . Essential hypertension, benign   . Mixed hyperlipidemia   . Unspecified vitamin D deficiency   . Positive PPD     h/o BCG vaccine   Past Surgical History  Procedure Laterality Date  . Tubal ligation    . Endometrial ablation  2007    Dr. Rana Snare   History   Social History  . Marital Status: Legally Separated    Spouse Name: N/A    Number of Children: 2  . Years of Education: N/A   Occupational History  . Nurse [employer]   Social History Main Topics  . Smoking status: Never Smoker   . Smokeless tobacco: Never Used  . Alcohol Use: Yes     Comment: occasional (once a month)  . Drug Use: No  . Sexually Active: Not Currently   Other Topics Concern  . Not on file   Social History Narrative   Lives with her son; no pets. Separated from her husband.  Daughter is in the army, living in [place]     Current outpatient prescriptions:albuterol (PROVENTIL HFA;VENTOLIN HFA) 108 (90 BASE) MCG/ACT inhaler, Inhale 2 puffs into the lungs every 6  (six) hours as needed for wheezing., Disp: 18 g, Rfl: 1;  cetirizine (ZYRTEC) 10 MG tablet, Take 10 mg by mouth daily.  , Disp: , Rfl: ;  cholecalciferol (VITAMIN D) 1000 UNITS tablet, Take 1,000 Units by mouth daily.  , Disp: , Rfl:  Diphenhydramine-PSE-APAP (ALLERGY SINUS HEADACHE RELIEF PO), Take 2 each by mouth every 4 (four) hours., Disp: , Rfl: ;  fish oil-omega-3 fatty acids 1000 MG capsule, Take 1 g by mouth daily.  , Disp: , Rfl: ;  lisinopril-hydrochlorothiazide (PRINZIDE,ZESTORETIC) 20-12.5 MG per tablet, TAKE 1 TABLET BY MOUTH ONCE DAILY, Disp: 90 tablet, Rfl: 0 Multiple Vitamins-Minerals (MULTIVITAMIN WITH MINERALS) tablet, Take 1 tablet by mouth daily.  , Disp: , Rfl: ;  ibuprofen (ADVIL,MOTRIN) 800 MG tablet, Take 1 tablet (800 mg total) by mouth every 8 (eight) hours as needed., Disp: 30 tablet, Rfl: 0  Allergies  Allergen Reactions  . Penicillins Hives  . Percocet (Oxycodone-Acetaminophen) Itching   ROS:  Denies nausea, vomiting, diarrhea, skin rashes, dizziness, chest pain, urinary complaints.  PHYSICAL EXAM: BP 120/72  Pulse 72  Temp(Src) 98.5 F (36.9 C) (Oral)  Ht 5' 9.25" (1.759 m)  Wt 235 lb (106.595 kg)  BMI 34.45 kg/m2  LMP 04/07/2012 Well developed, pleasant female, with high/hoarse voice, otherwise in no distress HEENT:  PERRL, conjunctiva  clear except for pterygium medially on right.  Nasal mucosa moderately edematous, no erythema or purulence.  Mildly tender over frontal sinuses and on either side of nose (proximal maxillary sinuses).  OP clear Neck: no lymphadenopathy or mass Heart: regular rate and rhythm without murmur Lungs: clear bilaterally, no wheezes, good air movement Skin: no rash Extremities: no edema  ASSESSMENT/PLAN:   Acute upper respiratory infections of unspecified site - no evidence of bacterial infection.  reviewed s/sx of bacterial infections; call or return if develops.add guaifenesin (Mucinex)  Sore throat - Plan: Rapid Strep  A  Essential hypertension, benign - well controlled.  continue to monitor if using decongestants  Asthma with allergic rhinitis - some RAD with this illness.  continue albuterol prn

## 2012-05-05 NOTE — Patient Instructions (Signed)
Drink plenty of fluids. Continue decongestants (and monitor BP--back off on decongestants if BP raises >140/90) Add guaifenesin (ie Mucinex--use plain, maximum strength, or you can use the DM version if cough is significant) Continue albuterol as needed  Call next week if persistent/worsening symptoms--fevers, discolored mucus/phlegm or other concerns

## 2012-05-09 ENCOUNTER — Other Ambulatory Visit: Payer: 59

## 2012-05-09 ENCOUNTER — Telehealth: Payer: Self-pay | Admitting: Family Medicine

## 2012-05-09 MED ORDER — BENZONATATE 200 MG PO CAPS: ORAL_CAPSULE | ORAL | Status: DC

## 2012-05-09 MED ORDER — AZITHROMYCIN 250 MG PO TABS: ORAL_TABLET | ORAL | Status: DC

## 2012-05-09 NOTE — Telephone Encounter (Signed)
Advise pt to continue with decongestant (ie phenylephrine or pseudoephedrine as active ingredient, to help dry up mucus), guaifenesin (ie Mucinex), +/- dextromethorphan (this is cough suppressant).  Can add tessalon 200mg  every 8 hrs as needed for cough--this is a cough medication that can be taken during day, doesn't cause sedation, #30.  Is the color of mucus getting worse, staying same or improving?  If getting thicker/darker/worsening, then also send in rx for z-pak. We talked about it taking up to a week before she starts turning the corner, and she isn't quite at a week yet.  If she is clearly worse (re: color, phlegm), then can start abx now.  Please send rx's after discussing with pt, to see which are appropriate

## 2012-05-09 NOTE — Telephone Encounter (Signed)
Pt states that she has spent over $30 dollars on decongestants and other stuff to help her get rid this sickness over the past month and nothing has worked and and pt has gotten worse, she states that the mucous is thicker and yellow and pt barely has a voice. i have sent in zpak and tessalon perles for pt to [facility] as pt did not want to continue trying over the counter meds.

## 2012-05-19 ENCOUNTER — Encounter: Payer: 59 | Admitting: Family Medicine

## 2012-05-31 ENCOUNTER — Telehealth: Payer: Self-pay | Admitting: Family Medicine

## 2012-05-31 ENCOUNTER — Telehealth: Payer: Self-pay | Admitting: Internal Medicine

## 2012-05-31 DIAGNOSIS — J45909 Unspecified asthma, uncomplicated: Secondary | ICD-10-CM

## 2012-05-31 DIAGNOSIS — I10 Essential (primary) hypertension: Secondary | ICD-10-CM

## 2012-05-31 MED ORDER — LISINOPRIL-HYDROCHLOROTHIAZIDE 20-12.5 MG PO TABS: ORAL_TABLET | ORAL | Status: DC

## 2012-05-31 MED ORDER — ALBUTEROL SULFATE HFA 108 (90 BASE) MCG/ACT IN AERS: 2.0000 | INHALATION_SPRAY | Freq: Four times a day (QID) | RESPIRATORY_TRACT | Status: DC | PRN

## 2012-05-31 NOTE — Telephone Encounter (Signed)
Refill request for lisinopril-hctz 20-12.5 #90 to [facility]

## 2012-05-31 NOTE — Telephone Encounter (Signed)
Pt called and stated she needs refills on bp meds and albuterol. She states that she was sick a while back and used all of her inhaler and needs another one. Pt uses [facility].

## 2012-05-31 NOTE — Telephone Encounter (Signed)
Pt states that her FMLA needs to be worded properly that the condition was written that she has 2 episodes a year for her allergies flare up and she is out 1-2 days of work and  PT STATES she has allergies year round and she is out more than 1-2 days. She wants it to say to say intermittent for the days shes out and for it not to say how many episodes she has a year cause she doesn't never know. She is to be faxing over the fmla forms to [place] again to correct

## 2012-05-31 NOTE — Telephone Encounter (Signed)
done

## 2012-06-01 NOTE — Telephone Encounter (Signed)
FMLA only discusses a particular condition and amount of time missed from work due to condition.  Her condition is for allergic reaction at work triggering allergies.  Days missed from work covered by [employer] are for this condition, not related to all illnesses.  She obviously may miss work for other illness.  The forms are very particular in requesting a specific number of frequencies and amount of work missed, despite the fact that this is completely an estimate and can vary from what is written.  I need to leave it as stated.  If she has concerns, then she needs OV to discuss in detail. It is especially hard for me to fill out the forms very specifically when she isn't necessarily evaluated for each illness/allergic reaction that she gets (OV/UC/ER visit), so I have nothing to go by to estimate number of times.  We can discuss this at visit.

## 2012-06-02 NOTE — Telephone Encounter (Signed)
Patient will be here Monday 06/06/12 @ 11:45am.

## 2012-06-06 ENCOUNTER — Encounter: Payer: Self-pay | Admitting: Family Medicine

## 2012-06-06 ENCOUNTER — Ambulatory Visit (INDEPENDENT_AMBULATORY_CARE_PROVIDER_SITE_OTHER): Payer: 59 | Admitting: Family Medicine

## 2012-06-06 VITALS — BP 118/72 | HR 68 | Ht 69.25 in | Wt 232.0 lb

## 2012-06-06 DIAGNOSIS — J309 Allergic rhinitis, unspecified: Secondary | ICD-10-CM

## 2012-06-06 DIAGNOSIS — J45909 Unspecified asthma, uncomplicated: Secondary | ICD-10-CM

## 2012-06-06 MED ORDER — MONTELUKAST SODIUM 10 MG PO TABS: 10.0000 mg | ORAL_TABLET | Freq: Every day | ORAL | Status: DC

## 2012-06-06 NOTE — Patient Instructions (Addendum)
Start taking Singulair daily. Overlap with the Allegra for at least 2 weeks, then try taking Allegra every other day for a week. If allergy symptoms do not return on days allegra isn't taken, you may be able to stop the Allegra, and use only as needed.  If you have recurrent allergy symptoms, you might need to take both medications daily.  If the amended FMLA forms do not please your HR, please let me know the name and number of person I should speak with.

## 2012-06-06 NOTE — Progress Notes (Signed)
Chief Complaint  Patient presents with  . Advice Only    FMLA paperwork revision.   She presents to discuss her FMLA paperwork, as recommended by her HR department.  Colognes and flowers in the patients rooms cause her to start coughing and wheezing.  Sometimes, related to these exposures, she coughs so much, loses her voice, lasts for about 4 hours and she needs to leave work.  Asthma flared after recent illness (see last OV).  Due to ongoing cough, laryngitis, she missed 4 days of work.  She reports that FMLA only covers her for 1-2 days at a time, so she was asked to return and have her papers amended.  Her symptoms have all resolved, and she is back to work.  She changed from zyrtec to Allegra, and this is working very well for her.  Has symptoms triggered at work a couple of times/month. Usually she can switch with other people, to avoid going into the rooms with the strong perfumes and flowers.  She has questions about referral to allergist.  Past Medical History  Diagnosis Date  . Allergic rhinitis, cause unspecified   . Asthma with allergic rhinitis   . Essential hypertension, benign   . Mixed hyperlipidemia   . Unspecified vitamin D deficiency   . Positive PPD     h/o BCG vaccine   Past Surgical History  Procedure Laterality Date  . Tubal ligation    . Endometrial ablation  2007    Dr. Rana Snare   History   Social History  . Marital Status: Legally Separated    Spouse Name: N/A    Number of Children: 2  . Years of Education: N/A   Occupational History  . Nurse [employer]   Social History Main Topics  . Smoking status: Never Smoker   . Smokeless tobacco: Never Used  . Alcohol Use: Yes     Comment: occasional (once a month)  . Drug Use: No  . Sexually Active: Not Currently   Other Topics Concern  . Not on file   Social History Narrative   Lives with her son; no pets. Separated from her husband.  Daughter is in the army, living in [place]   Current  Outpatient Prescriptions on File Prior to Visit  Medication Sig Dispense Refill  . cholecalciferol (VITAMIN D) 1000 UNITS tablet Take 1,000 Units by mouth daily.        . fish oil-omega-3 fatty acids 1000 MG capsule Take 3 g by mouth daily.       Marland Kitchen lisinopril-hydrochlorothiazide (PRINZIDE,ZESTORETIC) 20-12.5 MG per tablet TAKE 1 TABLET BY MOUTH ONCE DAILY  90 tablet  1  . Multiple Vitamins-Minerals (MULTIVITAMIN WITH MINERALS) tablet Take 1 tablet by mouth daily.        Marland Kitchen albuterol (PROVENTIL HFA;VENTOLIN HFA) 108 (90 BASE) MCG/ACT inhaler Inhale 2 puffs into the lungs every 6 (six) hours as needed for wheezing.  18 g  1  . ibuprofen (ADVIL,MOTRIN) 800 MG tablet Take 1 tablet (800 mg total) by mouth every 8 (eight) hours as needed.  30 tablet  0   No current facility-administered medications on file prior to visit.   Allergies  Allergen Reactions  . Penicillins Hives  . Percocet (Oxycodone-Acetaminophen) Itching   ROS:  Denies fevers, URI symptoms, cough, shortness of breath, sore throat, headaches, GI complaints, skin rashes or other concerns. See HPI. Hoarseness has resolved.  PHYSICAL EXAM: BP 118/72  Pulse 68  Ht 5' 9.25" (1.759 m)  Wt 232  lb (105.235 kg)  BMI 34.01 kg/m2  LMP 05/07/2012 Well developed, pleasant female in no distress.  She does not have hoarse voice, no coughing, runny nose, denies any symptoms currently.  Exam therefore limited to discussion.  ASSESSMENT/PLAN:  Asthma with allergic rhinitis - Plan: montelukast (SINGULAIR) 10 MG tablet  Allergic rhinitis, cause unspecified  We reviewed her FMLA forms, and the trouble in estimating frequency and duration of illness.  Her prior forms only referred to allergic reactions, and not to RAD related to viral or other URI illnesses.  Therefore, forms were amended to include this as a possibility as well.  We discussed what allergists would do/recommended. Encouraged maximizing her medical therapy at this point, and refer  if potentially interested in immune therapy, or having worsening symptoms despite treatment.  Encouraged starting singulair to give prevention against asthma/RAD, whereas currently she is only on allergy prevention with daily Allegra.  She can overlap these, and then taper off to just prn use of Allegra if allergy symptoms are improved on the Singulair.  Might need to use both daily if having frequent symptoms.  If that is the case, then referral to allergist would be warranted.  25 minute visit, all counseling.

## 2012-07-26 LAB — HM PAP SMEAR: HM PAP: NORMAL

## 2012-07-28 LAB — HM MAMMOGRAPHY

## 2012-07-29 ENCOUNTER — Ambulatory Visit (INDEPENDENT_AMBULATORY_CARE_PROVIDER_SITE_OTHER): Payer: 59 | Admitting: Medical

## 2012-07-29 ENCOUNTER — Encounter: Payer: Self-pay | Admitting: Medical

## 2012-07-29 VITALS — BP 120/80 | HR 78 | Temp 98.9°F | Resp 16 | Wt 232.0 lb

## 2012-07-29 DIAGNOSIS — B009 Herpesviral infection, unspecified: Secondary | ICD-10-CM

## 2012-07-29 DIAGNOSIS — B001 Herpesviral vesicular dermatitis: Secondary | ICD-10-CM

## 2012-07-29 MED ORDER — VALACYCLOVIR HCL 1 G PO TABS: 1000.0000 mg | ORAL_TABLET | Freq: Two times a day (BID) | ORAL | Status: DC

## 2012-07-29 NOTE — Patient Instructions (Addendum)
In general,when you have an outbreak, take 2 tablets twice daily for 1 day.  For suppression, you would only use 1/2 tablet daily.  Cold Sore A cold sore (fever blister) is a skin infection caused by the herpes simplex virus (HSV-1). HSV-1 is closely related to the virus that causes gential herpes (HSV-2), but they are not the same even though both viruses can cause oral and genital infections. Cold sores are small, fluid-filled sores inside of the mouth or on the lips, gums, nose, chin, cheeks, or fingers.  The herpes simplex virus can be easily passed (contagious) to other people through close personal contact, such as kissing or sharing personal items. The virus can also spread to other parts of the body, such as the eyes or genitals. Cold sores are contagious until the sores crust over completely. They often heal within 2 weeks.  Once a person is infected, the herpes simplex virus remains permanently in the body. Therefore, there is no cure for cold sores, and they often recur when a person is tired, stressed, sick, or gets too much sun. Additional factors that can cause a recurrence include hormone changes in menstruation or pregnancy, certain drugs, and cold weather.  CAUSES  Cold sores are caused by the herpes simplex virus. The virus is spread from person to person through close contact, such as through kissing, touching the affected area, or sharing personal items such as lip balm, razors, or eating utensils.  SYMPTOMS  The first infection may not cause symptoms. If symptoms develop, the symptoms often go through different stages. Here is how a cold sore develops:   Tingling, itching, or burning is felt 1 2 days before the outbreak.   Fluid-filled blisters appear on the lips, inside the mouth, nose, or on the cheeks.   The blisters start to ooze clear fluid.   The blisters dry up and a yellow crust appears in its place.   The crust falls off.  Symptoms depend on whether it is the  initial outbreak or a recurrence. Some other symptoms with the first outbreak may include:   Fever.   Sore throat.   Headache.   Muscle aches.   Swollen neck glands.  DIAGNOSIS  A diagnosis is often made based on your symptoms and looking at the sores. Sometimes, a sore may be swabbed and then examined in the lab to make a final diagnosis. If the sores are not present, blood tests can find the herpes simplex virus.  TREATMENT  There is no cure for cold sores and no vaccine for the herpes simplex virus. Within 2 weeks, most cold sores go away on their own without treatment. Medicines cannot make the infection go away, but medicine can help relieve some of the pain associated with the sores, can work to stop the virus from multiplying, and can also shorten healing time. Medicine may be in the form of creams, gels, pills, or a shot.  HOME CARE INSTRUCTIONS   Only take over-the-counter or prescription medicines for pain, discomfort, or fever as directed by your caregiver. Do not use aspirin.   Use a cotton-tip swab to apply creams or gels to your sores.   Do not touch the sores or pick the scabs. Wash your hands often. Do not touch your eyes without washing your hands first.   Avoid kissing, oral sex, and sharing personal items until sores heal.   Apply an ice pack on your sores for 10 15 minutes to ease any discomfort.  Avoid hot, cold, or salty foods because they may hurt your mouth. Eat a soft, bland diet to avoid irritating the sores. Use a straw to drink if you have pain when drinking out of a glass.   Keep sores clean and dry to prevent an infection of other tissues.   Avoid the sun and limit stress if these things trigger outbreaks. If sun causes cold sores, apply sunscreen on the lips before being out in the sun.  SEEK MEDICAL CARE IF:   You have a fever or persistent symptoms for more than 2 3 days.   You have a fever and your symptoms suddenly get worse.    You have pus, not clear fluid, coming from the sores.   You have redness that is spreading.   You have pain or irritation in your eye.   You get sores on your genitals.   Your sores do not heal within 2 weeks.   You have a weakened immune system.   You have frequent recurrences of cold sores.  MAKE SURE YOU:   Understand these instructions.  Will watch your condition.  Will get help right away if you are not doing well or get worse. Document Released: 01/31/2000 Document Revised: 10/28/2011 Document Reviewed: 06/17/2011 [facility] Patient Information 2014 [place], [place].

## 2012-07-29 NOTE — Progress Notes (Signed)
Subjective: Here for c/o recurrent cold sores.  Started getting in her 56s, gets periodically of either side of lips when stressed.  Usually starts with tingling then clear fluid blisters and redness, and then eventually scabs up and goes away.  Takes about 2 weeks or so for this to heal.  Has gotten about 2 episodes in last 43mo.   Wants medication to help with this.    Past Medical History  Diagnosis Date  . Allergic rhinitis, cause unspecified   . Asthma with allergic rhinitis   . Essential hypertension, benign   . Mixed hyperlipidemia   . Unspecified vitamin D deficiency   . Positive PPD     h/o BCG vaccine   ROS as in subjective  Objective: Gen: wd, wn, nad HEENT: conjunctiva normal, nares patent, TMs pearly, pharynx normal Oral: left upper lip lateral portion with faint oval healing area from last outbreak.  Otherwise no lesions, MMM Neck: supple, nontender, no mass or lymphadenopathy  Assessment: Encounter Diagnosis  Name Primary?  . Recurrent cold sores Yes   Plan: discussed diagnosis, symptoms, means of transmission, prevention, treatment options and when to use the treatment for either suppression vs outbreak.  She will currently use medication for outbreaks given the lack of frequency currently.  Script for Valtrex generic.  Recheck prn or if outbreaks increase in frequency.

## 2012-08-17 ENCOUNTER — Telehealth: Payer: Self-pay | Admitting: Family Medicine

## 2012-08-17 NOTE — Telephone Encounter (Signed)
Spoke with patient and she is not currently taking the ibuprofen 800mg  at this time, she will resume. Also she is having tightening and spasm, I offered to call in #10 on soma and she stated that she was boarding a plane to [place] and would like to know if she can call tomorrow with the name and number of a pharmacy there, if we can still call in for her. Please advise. Thanks.

## 2012-08-17 NOTE — Telephone Encounter (Signed)
Okay to call in to another pharmacy if it is needed.  She likely will improve just from the ibuprofen, and use of moist heat

## 2012-08-17 NOTE — Telephone Encounter (Signed)
She was last seen for this in 11/2011, from what I can tell upon review of chart.  At that time she was rx'd naprosyn and soma.  She subsequently has been on 800mg  of ibuprofen.  Has she been taking the ibuprofen?  Candace Walker is a muscle relaxant, only helpful for muscle spasm (not a pain reliever).  Is she having muscle spasm?  I recommend resuming ibuprofen 800mg  TID with food, heat, stretches, massage.  If truly having muscle spasm, okay for #10 only (since not being evaluated, should last her until she can come in, if symptoms don't resolve).

## 2012-08-18 ENCOUNTER — Telehealth: Payer: Self-pay | Admitting: Family Medicine

## 2012-08-18 DIAGNOSIS — M791 Myalgia, unspecified site: Secondary | ICD-10-CM

## 2012-08-18 DIAGNOSIS — M62838 Other muscle spasm: Secondary | ICD-10-CM

## 2012-08-18 MED ORDER — IBUPROFEN 800 MG PO TABS: 800.0000 mg | ORAL_TABLET | Freq: Three times a day (TID) | ORAL | Status: DC | PRN

## 2012-08-18 MED ORDER — CARISOPRODOL 350 MG PO TABS: 350.0000 mg | ORAL_TABLET | Freq: Three times a day (TID) | ORAL | Status: DC | PRN

## 2012-08-18 NOTE — Telephone Encounter (Signed)
Done

## 2012-08-18 NOTE — Telephone Encounter (Signed)
Pt called and stated that [place] is in [place], she wants her refills sent to [facility]. She will have her son pick them up.

## 2012-09-12 ENCOUNTER — Other Ambulatory Visit: Payer: Self-pay

## 2012-09-14 ENCOUNTER — Encounter: Payer: Self-pay | Admitting: Family Medicine

## 2012-09-28 ENCOUNTER — Ambulatory Visit (HOSPITAL_COMMUNITY)
Admission: RE | Admit: 2012-09-28 | Discharge: 2012-09-28 | Disposition: A | Discharge status: Home / Self Care | Payer: 59 | Source: Ambulatory Visit | Attending: Obstetrics and Gynecology | Admitting: Obstetrics and Gynecology

## 2012-09-28 ENCOUNTER — Other Ambulatory Visit (HOSPITAL_COMMUNITY): Payer: Self-pay | Admitting: Obstetrics and Gynecology

## 2012-09-28 DIAGNOSIS — R112 Nausea with vomiting, unspecified: Secondary | ICD-10-CM | POA: Insufficient documentation

## 2012-09-28 DIAGNOSIS — K37 Unspecified appendicitis: Secondary | ICD-10-CM

## 2012-09-28 DIAGNOSIS — K7689 Other specified diseases of liver: Secondary | ICD-10-CM | POA: Insufficient documentation

## 2012-09-28 DIAGNOSIS — R1031 Right lower quadrant pain: Secondary | ICD-10-CM | POA: Insufficient documentation

## 2012-09-28 DIAGNOSIS — Z9851 Tubal ligation status: Secondary | ICD-10-CM | POA: Insufficient documentation

## 2012-09-28 DIAGNOSIS — N854 Malposition of uterus: Secondary | ICD-10-CM | POA: Insufficient documentation

## 2012-09-28 DIAGNOSIS — K449 Diaphragmatic hernia without obstruction or gangrene: Secondary | ICD-10-CM | POA: Insufficient documentation

## 2012-09-28 DIAGNOSIS — R509 Fever, unspecified: Secondary | ICD-10-CM | POA: Insufficient documentation

## 2012-09-28 MED ORDER — IOHEXOL 300 MG/ML  SOLN: 50.0000 mL | INTRAMUSCULAR | Status: AC

## 2012-09-28 MED ORDER — IOHEXOL 300 MG/ML  SOLN
100.0000 mL | Freq: Once | INTRAMUSCULAR | Status: AC | PRN
  Administered 2012-09-28: 100 mL via INTRAVENOUS

## 2012-10-02 ENCOUNTER — Emergency Department (HOSPITAL_COMMUNITY)
Admission: EM | Admit: 2012-10-02 | Discharge: 2012-10-02 | Disposition: A | Discharge status: Home / Self Care | Payer: 59 | Attending: Emergency Medicine | Admitting: Emergency Medicine

## 2012-10-02 ENCOUNTER — Encounter (HOSPITAL_COMMUNITY): Payer: Self-pay | Admitting: Emergency Medicine

## 2012-10-02 DIAGNOSIS — R509 Fever, unspecified: Secondary | ICD-10-CM | POA: Insufficient documentation

## 2012-10-02 DIAGNOSIS — Z862 Personal history of diseases of the blood and blood-forming organs and certain disorders involving the immune mechanism: Secondary | ICD-10-CM | POA: Insufficient documentation

## 2012-10-02 DIAGNOSIS — R112 Nausea with vomiting, unspecified: Secondary | ICD-10-CM | POA: Insufficient documentation

## 2012-10-02 DIAGNOSIS — Z880 Allergy status to penicillin: Secondary | ICD-10-CM | POA: Insufficient documentation

## 2012-10-02 DIAGNOSIS — J45909 Unspecified asthma, uncomplicated: Secondary | ICD-10-CM | POA: Insufficient documentation

## 2012-10-02 DIAGNOSIS — Z79899 Other long term (current) drug therapy: Secondary | ICD-10-CM | POA: Insufficient documentation

## 2012-10-02 DIAGNOSIS — R42 Dizziness and giddiness: Secondary | ICD-10-CM | POA: Insufficient documentation

## 2012-10-02 DIAGNOSIS — E559 Vitamin D deficiency, unspecified: Secondary | ICD-10-CM | POA: Insufficient documentation

## 2012-10-02 DIAGNOSIS — R109 Unspecified abdominal pain: Secondary | ICD-10-CM | POA: Insufficient documentation

## 2012-10-02 DIAGNOSIS — Z8639 Personal history of other endocrine, nutritional and metabolic disease: Secondary | ICD-10-CM | POA: Insufficient documentation

## 2012-10-02 DIAGNOSIS — I10 Essential (primary) hypertension: Secondary | ICD-10-CM | POA: Insufficient documentation

## 2012-10-02 LAB — COMPREHENSIVE METABOLIC PANEL
ALT: 33 U/L (ref 0–35)
Alkaline Phosphatase: 49 U/L (ref 39–117)
Chloride: 100 mEq/L (ref 96–112)
GFR calc Af Amer: 88 mL/min — ABNORMAL LOW (ref >90)
Glucose, Bld: 105 mg/dL — ABNORMAL HIGH (ref 70–99)
Potassium: 3.7 mEq/L (ref 3.5–5.1)
Sodium: 134 mEq/L — ABNORMAL LOW (ref 135–145)
Total Protein: 7.6 g/dL (ref 6.0–8.3)

## 2012-10-02 LAB — URINALYSIS, ROUTINE W REFLEX MICROSCOPIC
Nitrite: NEGATIVE
Specific Gravity, Urine: 1.021 (ref 1.005–1.030)
Urobilinogen, UA: 0.2 mg/dL (ref 0.0–1.0)

## 2012-10-02 LAB — URINE MICROSCOPIC-ADD ON

## 2012-10-02 LAB — CBC WITH DIFFERENTIAL/PLATELET
Lymphocytes Relative: 32 % (ref 12–46)
Lymphs Abs: 0.9 10*3/uL (ref 0.7–4.0)
MCV: 86.1 fL (ref 78.0–100.0)
Neutrophils Relative %: 60 % (ref 43–77)
Platelets: 147 10*3/uL — ABNORMAL LOW (ref 150–400)
RBC: 4.75 MIL/uL (ref 3.87–5.11)
WBC: 2.8 10*3/uL — ABNORMAL LOW (ref 4.0–10.5)

## 2012-10-02 MED ORDER — SODIUM CHLORIDE 0.9 % IV BOLUS (SEPSIS)
1000.0000 mL | Freq: Once | INTRAVENOUS | Status: AC
  Administered 2012-10-02: 1000 mL via INTRAVENOUS

## 2012-10-02 MED ORDER — KETOROLAC TROMETHAMINE 30 MG/ML IJ SOLN
30.0000 mg | Freq: Once | INTRAMUSCULAR | Status: AC
  Administered 2012-10-02: 30 mg via INTRAVENOUS
  Filled 2012-10-02: qty 1

## 2012-10-02 MED ORDER — ONDANSETRON HCL 4 MG/2ML IJ SOLN
4.0000 mg | Freq: Once | INTRAMUSCULAR | Status: AC
  Administered 2012-10-02: 4 mg via INTRAVENOUS
  Filled 2012-10-02: qty 2

## 2012-10-02 NOTE — ED Provider Notes (Signed)
CSN: [id number]     Arrival date & time 10/02/12  1124 History     First MD Initiated Contact with Patient 10/02/12 1305     Chief Complaint  Patient presents with  . Groin Pain   (Consider location/radiation/quality/duration/timing/severity/associated sxs/prior Treatment) HPI Candace Walker is a 42 year old female who presents to [facility] for right lower abdominal and groin pain.  She reports this pain started last Tuesday and she saw her GYN on Wednesday.  She reports her GYN did a U/S which was negative, obtained a U/A which was positive for RBC, and she obtained an Abd CT to rule out appendicitis.  She was told that besides the U/A everything was negative and was prescribed Cipro, Tramadol and Promethizine and instructed to follow up on Monday.  She reports that since that time her groin pain has continued.  She does note that she had a fever on wed and Thursday but resolved on Friday.  She notes that she stopped taking Cipro on Friday and did not take her Saturday or Sunday doses.  She reports she has continued to be nauseous, had 3 episodes of NBNB emesis on wed, and had 3 more this morning which prompted her to come to the ED.   Past Medical History  Diagnosis Date  . Allergic rhinitis, cause unspecified   . Asthma with allergic rhinitis   . Essential hypertension, benign   . Mixed hyperlipidemia   . Unspecified vitamin D deficiency   . Positive PPD     h/o BCG vaccine   Past Surgical History  Procedure Laterality Date  . Tubal ligation    . Endometrial ablation  2007    Dr. Rana Snare   Family History  Problem Relation Age of Onset  . Diabetes Mother   . Hypertension Mother   . Allergies Mother   . Heart disease Father 78    MI  . Hyperlipidemia Father   . Hypertension Father   . Cancer Paternal Grandmother     breast cancer in 74's   History  Substance Use Topics  . Smoking status: Never Smoker   . Smokeless tobacco: Never Used  . Alcohol Use: Yes     Comment:  occasional (once a month)   OB History   Grav Para Term Preterm Abortions TAB SAB Ect Mult Living   22 22             Review of Systems  Constitutional: Positive for fever and appetite change. Negative for chills and fatigue.  HENT: Negative for congestion and trouble swallowing.   Eyes: Negative for visual disturbance.  Respiratory: Negative for chest tightness and shortness of breath.   Cardiovascular: Negative for chest pain and palpitations.  Gastrointestinal: Positive for nausea, vomiting and abdominal pain. Negative for diarrhea, constipation and abdominal distention.  Endocrine: Negative for polyuria.  Genitourinary: Negative for dysuria, hematuria, flank pain, difficulty urinating and menstrual problem.  Musculoskeletal: Negative for myalgias and arthralgias.  Skin: Negative for wound.  Neurological: Positive for light-headedness. Negative for dizziness, syncope and weakness.  Psychiatric/Behavioral: Negative for confusion.    Allergies  Penicillins and Percocet  Home Medications   Current Outpatient Rx  Name  Route  Sig  Dispense  Refill  . lisinopril-hydrochlorothiazide (PRINZIDE,ZESTORETIC) 20-12.5 MG per tablet   Oral   Take 1 tablet by mouth daily.         Marland Kitchen albuterol (PROVENTIL HFA;VENTOLIN HFA) 108 (90 BASE) MCG/ACT inhaler   Inhalation   Inhale 2 puffs  into the lungs every 6 (six) hours as needed for wheezing.   18 g   1   . carisoprodol (SOMA) 350 MG tablet   Oral   Take 1 tablet (350 mg total) by mouth 3 (three) times daily as needed for muscle spasms.   10 tablet   0   . cholecalciferol (VITAMIN D) 1000 UNITS tablet   Oral   Take 1,000 Units by mouth daily.           . fexofenadine (ALLEGRA) 180 MG tablet   Oral   Take 180 mg by mouth daily.         . fish oil-omega-3 fatty acids 1000 MG capsule   Oral   Take 3 g by mouth daily.          Marland Kitchen ibuprofen (ADVIL,MOTRIN) 800 MG tablet   Oral   Take 1 tablet (800 mg total) by mouth every 8  (eight) hours as needed.   30 tablet   0   . montelukast (SINGULAIR) 10 MG tablet   Oral   Take 1 tablet (10 mg total) by mouth at bedtime.   30 tablet   3   . Multiple Vitamins-Minerals (MULTIVITAMIN WITH MINERALS) tablet   Oral   Take 1 tablet by mouth daily.           . valACYclovir (VALTREX) 1000 MG tablet   Oral   Take 1 tablet (1,000 mg total) by mouth 2 (two) times daily.   30 tablet   1    BP 123/86  Pulse 68  Temp(Src) 98.7 F (37.1 C) (Oral)  Resp 18  Ht 5' 8.5" (1.74 m)  Wt 232 lb (105.235 kg)  BMI 34.76 kg/m2  SpO2 100% Physical Exam  Nursing note and vitals reviewed. Constitutional: She is oriented to person, place, and time. She appears well-developed and well-nourished. No distress.  HENT:  Head: Normocephalic and atraumatic.  Eyes: Conjunctivae and EOM are normal. No scleral icterus.  Cardiovascular: Normal rate and regular rhythm.   Pulmonary/Chest: Effort normal and breath sounds normal. No respiratory distress. She has no wheezes. She has no rales.  Abdominal: Soft. Bowel sounds are normal. She exhibits no distension. There is tenderness (mild RLQ). There is no rebound.  Musculoskeletal: She exhibits no edema.  Neurological: She is alert and oriented to person, place, and time.  Skin: Skin is warm and dry. She is not diaphoretic.  Psychiatric: She has a normal mood and affect.    ED Course   Procedures (including critical care time)  Labs Reviewed  CBC WITH DIFFERENTIAL - Abnormal; Notable for the following:    WBC 2.8 (*)    Platelets 147 (*)    All other components within normal limits  COMPREHENSIVE METABOLIC PANEL - Abnormal; Notable for the following:    Sodium 134 (*)    Glucose, Bld 105 (*)    AST 38 (*)    Total Bilirubin 0.2 (*)    GFR calc non Af Amer 76 (*)    GFR calc Af Amer 88 (*)    All other components within normal limits  URINALYSIS, ROUTINE W REFLEX MICROSCOPIC   No results found. 1. Abdominal pain     MDM   42 year old female who presents with complaints of right groin pain with nausea and vomiting for 5-6 days.  Patient has been seen by GYN and worked up with pelvic U/S, CT scan, and U/A, has been treated for UTI.  Patient's abdominal exam  was benign and only revealed mild RLQ tenderness. CT scan showed no signs of appendicitis, hydronephrosis or kidney stone.  Patients CBC showed a WBC count of 2.8 with normal differential.  Her CMP was unremarkable.  U/A showed only trace Hgb but micro showed 0-2 RBC.  Patient's pain was greatly reduced with 30mg  IV toradol, nausea subsided with 4mg  IV zofran, and patient was given 1L of NS bolus.  She will be discharged with follow up tomorrow by GYN and has an appointment with her PCP on wed.  She was instructed to continue her current medications and take 800mg  Ibuprofen every 8 hours PRN.  She was given return precautions.  Carlynn Purl, DO 10/02/12 1521

## 2012-10-02 NOTE — ED Provider Notes (Signed)
Medical screening examination/treatment/procedure(s) were conducted as a shared visit with non-physician practitioner(s) and myself.  I personally evaluated the patient during the encounter   Her abdomen is benign. Her exam and studies are reassuring. 6-7 days of symptoms. Normal urine. CT without appendicitis or stone, does show some mesenteric lymphadenopathy. Plan simple anti-inflammatories expectant management.  She has a benign abdomen here: no guarding rebound or peritoneal irritation  Claudean Kinds, MD 10/02/12 1507

## 2012-10-02 NOTE — ED Notes (Signed)
Pt reports onset Wednesday with right groin pain that radiates down right leg. Pt seen by her PMD for same and had ultrasound, CT scan and urine test. Pt was to return Monday for follow up. Pt reports pain worse and now having nausea and dizziness.

## 2012-10-04 NOTE — ED Provider Notes (Signed)
Medical screening examination/treatment/procedure(s) were performed by non-physician practitioner and as supervising physician I was immediately available for consultation/collaboration.   Claudean Kinds, MD 10/04/12 [phone]

## 2012-10-05 ENCOUNTER — Ambulatory Visit: Payer: 59 | Admitting: Family Medicine

## 2012-10-06 ENCOUNTER — Ambulatory Visit (INDEPENDENT_AMBULATORY_CARE_PROVIDER_SITE_OTHER): Payer: 59 | Admitting: Family Medicine

## 2012-10-06 ENCOUNTER — Encounter: Payer: Self-pay | Admitting: Family Medicine

## 2012-10-06 VITALS — BP 124/70 | HR 64 | Temp 98.3°F | Ht 69.0 in | Wt 228.0 lb

## 2012-10-06 DIAGNOSIS — M7061 Trochanteric bursitis, right hip: Secondary | ICD-10-CM

## 2012-10-06 DIAGNOSIS — M76899 Other specified enthesopathies of unspecified lower limb, excluding foot: Secondary | ICD-10-CM

## 2012-10-06 DIAGNOSIS — M25551 Pain in right hip: Secondary | ICD-10-CM

## 2012-10-06 DIAGNOSIS — M25559 Pain in unspecified hip: Secondary | ICD-10-CM

## 2012-10-06 MED ORDER — IBUPROFEN 800 MG PO TABS: 800.0000 mg | ORAL_TABLET | Freq: Three times a day (TID) | ORAL | Status: DC | PRN

## 2012-10-06 NOTE — Progress Notes (Addendum)
Chief Complaint  Candace Walker presents with  . Abdominal Pain    follow up o abdominal pain, saw Julio Sicks, NP @ [facility] and also went to ER this past Sunday. Only thing that is still bothering her is her right hip. She states that there was some right hip arthritis found on CT.    Candace Walker presents for ER follow-up.  She was seen 8/17 with complaint of right lower abdominal and groin pain. Symptoms started last Tuesday and she saw her GYN on Wednesday. She had seen her GYN, at time of visit she had fever 101.5.  GYN did U/Sshowing cystic area in myometrium, intramural fibroid.  U/A  was positive for 2+ RBC. She was treated with cipro. Abd CT was done to rule out appendicitis. She was told that besides the U/A everything was negative and was prescribed Cipro, Tramadol and Promethazine and instructed to follow up on Monday.  See CT results below. Her pain worsened, fever persisted through that week but improved to lowgrade. She notes that she stopped taking Cipro on Friday and did not take her Saturday or Sunday doses. She had worsening nausea, and started vomiting, as well as having significantly worsening pain.  She also had 3 loose stools the day of the ER visit.  No other diarrhea prior to that.  CT results: There is some subchondral sclerosis anteriorly in the right femoral  head surrounding subchondral lucency, favored to be degenerative in  origin. This could reflect atypical avascular necrosis without  subchondral collapse.  IMPRESSION:  1. No evidence of appendicitis or other acute process.  2. Central low density within the uterus, similar to prior MRI and  again suggesting adenomyosis.  3. Stable hepatic cysts.  4. Subchondral sclerosis anteriorly in the right femoral head,  likely degenerative.   In ER she was treated with zofran, IV fluids and toradol.  She continued with 800mg  ibuprofen and pain meds as needed, and didn't take any further cipro.  She feels that the cipro  exacerbated her abdominal pain.  Repeat labs in ER showed WBC down to 2.8 (was 4.3 at GYN) and U/A in ER only showed trace blood (was 2+ at GYN).  She currently is complaining of some hip pain, no further fevers, abdominal pain, vomiting.  She apparently has had right hip pain for a while, which she attributed to exercise routine that she was doing for weight loss.  Has pain if she sits too long, has discomfort when she gets up, but it works itself out.  She also has pain if she lies on her right side in bed. Pain got worse about 2 weeks prior to everything else.  She is no longer taking the ibuprofen, 2 days ago, and pain is tolerable.  Pain is mainly when she just gets up, but pain will subside while still weight-bearing  Past Medical History  Diagnosis Date  . Allergic rhinitis, cause unspecified   . Asthma with allergic rhinitis   . Essential hypertension, benign   . Mixed hyperlipidemia   . Unspecified vitamin D deficiency   . Positive PPD     h/o BCG vaccine   Past Surgical History  Procedure Laterality Date  . Tubal ligation    . Endometrial ablation  2007    Dr. Rana Snare   History   Social History  . Marital Status: Divorced    Spouse Name: N/A    Number of Children: 2  . Years of Education: N/A   Occupational History  .  Nurse [employer]   Social History Main Topics  . Smoking status: Never Smoker   . Smokeless tobacco: Never Used  . Alcohol Use: Yes     Comment: occasional (once a month)  . Drug Use: No  . Sexual Activity: Not Currently   Other Topics Concern  . Not on file   Social History Narrative   Lives with her son; no pets. Separated from her husband.  Daughter is in the army, living in [place]   Current outpatient prescriptions:cholecalciferol (VITAMIN D) 1000 UNITS tablet, Take 1,000 Units by mouth daily.  , Disp: , Rfl: ;  fexofenadine (ALLEGRA) 180 MG tablet, Take 180 mg by mouth at bedtime. , Disp: , Rfl: ;  fish oil-omega-3 fatty acids 1000 MG capsule,  Take 3 g by mouth daily. , Disp: , Rfl: ;  lisinopril-hydrochlorothiazide (PRINZIDE,ZESTORETIC) 20-12.5 MG per tablet, Take 1 tablet by mouth daily., Disp: , Rfl:  Multiple Vitamins-Minerals (MULTIVITAMIN WITH MINERALS) tablet, Take 1 tablet by mouth daily.  , Disp: , Rfl: ;  albuterol (PROVENTIL HFA;VENTOLIN HFA) 108 (90 BASE) MCG/ACT inhaler, Inhale 2 puffs into the lungs every 6 (six) hours as needed for wheezing., Disp: 18 g, Rfl: 1;  carisoprodol (SOMA) 350 MG tablet, Take 1 tablet (350 mg total) by mouth 3 (three) times daily as needed for muscle spasms., Disp: 10 tablet, Rfl: 0 ibuprofen (ADVIL,MOTRIN) 800 MG tablet, Take 1 tablet (800 mg total) by mouth every 8 (eight) hours as needed. Use for 1-2 weeks as needed for hip bursitis/pain, Disp: 60 tablet, Rfl: 0;  traMADol (ULTRAM) 50 MG tablet, Take 50 mg by mouth every 6 (six) hours as needed for pain., Disp: , Rfl:   Allergies  Allergen Reactions  . Penicillins Hives  . Percocet [Oxycodone-Acetaminophen] Itching    ROS:  Denies any recurrent fever.  No further nausea, vomiting.  Denies urgency, frequency, dysuria.  Denies bleeding/bruising/rash.  See HPI.  No URI symptoms, cough, shortness of breath, chest pain or other concerns.   PHYSICAL EXAM: BP 124/70  Pulse 64  Temp(Src) 98.3 F (36.8 C) (Oral)  Ht 5\' 9"  (1.753 m)  Wt 228 lb (103.42 kg)  BMI 33.65 kg/m2  Well developed, pleasant female in no distress Heart: regular rate and rhythm with 2/6 SEM.  No rub/gallop Lungs: clear bilaterally Abdomen: soft, nontender, no mass Back: no spine or CVA tenderness Right hip:  Tender over trochanteric bursa.  nontender in groin, no lymphadenopathy. She has some pain (lateral hip, at bursa) with internal rotation of hip, no pain with external rotation, FROM. Extremities: no edema Psych: normal mood, affect Neuro: normal strength, sensation, gait  Assessment/plan: Hip pain, right - Plan: ibuprofen (ADVIL,MOTRIN) 800 MG  tablet  Trochanteric bursitis of right hip - Plan: ibuprofen (ADVIL,MOTRIN) 800 MG tablet    Given possibility of atypical avascular sclerosis per CT result, and h/o steroids in past, we discussed that if pain is worsening (groin pain, pain with weight-bearing) and not back to her usual baseline discomfort, that we should do MRI for further evaluation  Hip pain--component of degenerative changes +/- poss atypical avascular sclerosis, but also has component of R trochanteric bursitis.  Therefore, recommended use of 800mg  TID ibuprofen regularly for at least 7-10 days. Handout given.  All questions answered.  NSAID risks/precautions reviewed.

## 2012-10-06 NOTE — Patient Instructions (Addendum)
if pain is worsening and not back to her usual baseline discomfort, that we should do MRI--ie if having more pain with weight-bearing (not working itself out as it is now).  Use the ibuprofen regularly three times daily with food for up to 2 weeks to treat the bursitis (that is the lateral hip pain, NOT the groin pain).  Once hip bursitis resolves, you can go back to as needed use of tylenol or ibuprofen for the groin hip pain.  Hip Bursitis Bursitis is a swelling and soreness (inflammation) of a fluid-filled sac (bursa). This sac overlies and protects the joints.  CAUSES   Injury.  Overuse of the muscles surrounding the joint.  Arthritis.  Gout.  Infection.  Cold weather.  Inadequate warm-up and conditioning prior to activities. The cause may not be known.  SYMPTOMS   Mild to severe irritation.  Tenderness and swelling over the outside of the hip.  Pain with motion of the hip.  If the bursa becomes infected, a fever may be present. Redness, tenderness, and warmth will develop over the hip. Symptoms usually lessen in 3 to 4 weeks with treatment, but can come back. TREATMENT If conservative treatment does not work, your caregiver may advise draining the bursa and injecting cortisone into the area. This may speed up the healing process. This may also be used as an initial treatment of choice. HOME CARE INSTRUCTIONS   Apply ice to the affected area for 15-20 minutes every 3 to 4 hours while awake for the first 2 days. Put the ice in a plastic bag and place a towel between the bag of ice and your skin.  Rest the painful joint as much as possible, but continue to put the joint through a normal range of motion at least 4 times per day. When the pain lessens, begin normal, slow movements and usual activities to help prevent stiffness of the hip.  Only take over-the-counter or prescription medicines for pain, discomfort, or fever as directed by your caregiver.  Use crutches to  limit weight bearing on the hip joint, if advised.  Elevate your painful hip to reduce swelling. Use pillows for propping and cushioning your legs and hips.  Gentle massage may provide comfort and decrease swelling. SEEK IMMEDIATE MEDICAL CARE IF:   Your pain increases even during treatment, or you are not improving.  You have a fever.  You have heat and inflammation over the involved bursa.  You have any other questions or concerns. MAKE SURE YOU:   Understand these instructions.  Will watch your condition.  Will get help right away if you are not doing well or get worse. Document Released: 07/25/2001 Document Revised: 04/27/2011 Document Reviewed: 02/22/2008 [facility] Patient Information 2014 [place], [place].

## 2012-10-20 ENCOUNTER — Encounter: Payer: Self-pay | Admitting: *Deleted

## 2012-11-03 ENCOUNTER — Other Ambulatory Visit: Payer: Self-pay | Admitting: *Deleted

## 2012-11-03 ENCOUNTER — Telehealth: Payer: Self-pay | Admitting: Internal Medicine

## 2012-11-03 ENCOUNTER — Encounter: Payer: Self-pay | Admitting: Family Medicine

## 2012-11-03 ENCOUNTER — Other Ambulatory Visit: Payer: Self-pay | Admitting: Family Medicine

## 2012-11-03 DIAGNOSIS — M25551 Pain in right hip: Secondary | ICD-10-CM

## 2012-11-03 MED ORDER — TRAMADOL HCL 50 MG PO TABS: 50.0000 mg | ORAL_TABLET | Freq: Four times a day (QID) | ORAL | Status: DC | PRN

## 2012-11-03 NOTE — Telephone Encounter (Signed)
Patient informed that she is to have R hip MRI only.

## 2012-11-03 NOTE — Telephone Encounter (Signed)
Ok for refill of tramadol, and for MRI of hip (see last OV note)

## 2012-11-03 NOTE — Telephone Encounter (Signed)
No--only MRI of the hip with the abnormality on CT scan. We had discussed other causes of hip pain at last visit, including bursitis, and MRI NOT indicated at this time on other hip.  Leave just the right hip

## 2012-11-03 NOTE — Telephone Encounter (Signed)
Called patient to let her know that I called in tramadol. Called her to ask what facility she wanted to have MRI done at. When I called her back after I has scheduled the R hip MRI for 11/14/12 @ 8:00 at [facility] patient stated that she didn't want just R hip MRI done she wanted both. Last OV stated right hip pain. Patient states that the left hip hurts from time to time. What would you like me to do? Leave as is or try to schedule L hip also?

## 2012-11-03 NOTE — Telephone Encounter (Signed)
Pt states she is still in a lot of pain and wants to get an MRI done and wants a refill on her tramadol that the ED to [facility].

## 2012-11-14 ENCOUNTER — Other Ambulatory Visit: Payer: Self-pay | Admitting: *Deleted

## 2012-11-14 ENCOUNTER — Telehealth: Payer: Self-pay | Admitting: Family Medicine

## 2012-11-14 ENCOUNTER — Ambulatory Visit (HOSPITAL_COMMUNITY)
Admission: RE | Admit: 2012-11-14 | Discharge: 2012-11-14 | Disposition: A | Discharge status: Home / Self Care | Payer: 59 | Source: Ambulatory Visit | Attending: Family Medicine | Admitting: Family Medicine

## 2012-11-14 DIAGNOSIS — M25551 Pain in right hip: Secondary | ICD-10-CM

## 2012-11-14 NOTE — Telephone Encounter (Signed)
Likely needs "open" MRI, not closed as message states.  Please arrange.  If needed we can give xanax prior to procedure, but then she would need someone else to drive her.  Please arrange

## 2012-11-16 NOTE — Telephone Encounter (Signed)
Done

## 2012-11-18 ENCOUNTER — Telehealth: Payer: Self-pay | Admitting: Family Medicine

## 2012-11-18 NOTE — Telephone Encounter (Signed)
See her previous request for this in phone call dated 9/18.  She was seen in August for pain, and the MRI was to f/u on an abnormality previously seen in right hip. She needs OV/eval for evaluation of left hip pain (there are lots of other causes, that do NOT need eval with MRI).  Schedule OV, but have her go ahead and get the R MRI

## 2012-11-18 NOTE — Telephone Encounter (Signed)
Informed patient of Dr. Delford Field message and scheduled pt to be seen for L hip pain

## 2012-11-18 NOTE — Telephone Encounter (Signed)
Please call patient scheduled for MRI of R hip but now having pain in Left hip also . Would like to have MRI of both hips when she goes

## 2012-11-20 ENCOUNTER — Other Ambulatory Visit: Payer: 59

## 2012-11-21 ENCOUNTER — Telehealth: Payer: Self-pay | Admitting: Family Medicine

## 2012-11-21 NOTE — Telephone Encounter (Signed)
Pt called and stated that she had to cancel mri because she could not afford copay. She will reschedule at beginning of the year. Dr. Lynelle Doctor was informed and noted.

## 2012-11-22 ENCOUNTER — Other Ambulatory Visit: Payer: 59

## 2012-11-23 ENCOUNTER — Encounter: Payer: Self-pay | Admitting: Family Medicine

## 2012-11-23 ENCOUNTER — Ambulatory Visit (INDEPENDENT_AMBULATORY_CARE_PROVIDER_SITE_OTHER): Payer: 59 | Admitting: Family Medicine

## 2012-11-23 VITALS — BP 130/70 | HR 76 | Temp 98.2°F | Ht 69.0 in | Wt 224.0 lb

## 2012-11-23 DIAGNOSIS — M76899 Other specified enthesopathies of unspecified lower limb, excluding foot: Secondary | ICD-10-CM

## 2012-11-23 DIAGNOSIS — M2420 Disorder of ligament, unspecified site: Secondary | ICD-10-CM

## 2012-11-23 DIAGNOSIS — M629 Disorder of muscle, unspecified: Secondary | ICD-10-CM

## 2012-11-23 DIAGNOSIS — M7632 Iliotibial band syndrome, left leg: Secondary | ICD-10-CM

## 2012-11-23 DIAGNOSIS — M7072 Other bursitis of hip, left hip: Secondary | ICD-10-CM

## 2012-11-23 DIAGNOSIS — J069 Acute upper respiratory infection, unspecified: Secondary | ICD-10-CM

## 2012-11-23 NOTE — Progress Notes (Signed)
Chief Complaint  Patient presents with  . Cough    sinus congestion that started Monday, progressed to a cough yesterday. She would like to be seen for this today as her hip is feeling better.   Left hip pain: She reports that after speaking to a coworker who is a PT, she started icing the bottom of her foot, and using heat to her hip. She states that her pain has improved since doing this.  She is mainly having a lot of pain in the evenings after work, being on her feet all day.  Pain is left lateral hip, all the way down her leg, going to the heel, on the bottom of her foot.  She denies numbness or tingling, no weakness.  She had abdominal pain from the ibuprofen.  She was given protonix at work, which helped ease the abdominal pain. Instead, she has just been using tramadol prn severe pain.  She has been having sinus headaches for 2 days.  Having congestion that is worse at night. Nasal mucus is yellow, bad-tasting.  She has some sore throat and cough, cough started yesterday.  Phlegm is discolored.  Denies any fever--99.7 two nights ago.  Denies sick contacts. She was achey on Monday, and that has resolved. She hasn't taken anything for these symtpoms.  Past Medical History  Diagnosis Date  . Allergic rhinitis, cause unspecified   . Asthma with allergic rhinitis   . Essential hypertension, benign   . Mixed hyperlipidemia   . Unspecified vitamin D deficiency   . Positive PPD     h/o BCG vaccine   Past Surgical History  Procedure Laterality Date  . Tubal ligation    . Endometrial ablation  2007    Dr. Rana Snare   History   Social History  . Marital Status: Divorced    Spouse Name: N/A    Number of Children: 2  . Years of Education: N/A   Occupational History  . Nurse [employer]   Social History Main Topics  . Smoking status: Never Smoker   . Smokeless tobacco: Never Used  . Alcohol Use: Yes     Comment: occasional (once a month)  . Drug Use: No  . Sexual Activity: Not  Currently   Other Topics Concern  . Not on file   Social History Narrative   Lives with her son; no pets. Separated from her husband.  Daughter is in the army, living in [place]   Current outpatient prescriptions:cholecalciferol (VITAMIN D) 1000 UNITS tablet, Take 1,000 Units by mouth daily.  , Disp: , Rfl: ;  fexofenadine (ALLEGRA) 180 MG tablet, Take 180 mg by mouth at bedtime. , Disp: , Rfl: ;  fish oil-omega-3 fatty acids 1000 MG capsule, Take 3 g by mouth daily. , Disp: , Rfl: ;  lisinopril-hydrochlorothiazide (PRINZIDE,ZESTORETIC) 20-12.5 MG per tablet, Take 1 tablet by mouth daily., Disp: , Rfl:  Multiple Vitamins-Minerals (MULTIVITAMIN WITH MINERALS) tablet, Take 1 tablet by mouth daily.  , Disp: , Rfl: ;  traMADol (ULTRAM) 50 MG tablet, Take 1 tablet (50 mg total) by mouth every 6 (six) hours as needed for pain., Disp: 30 tablet, Rfl: 0;  albuterol (PROVENTIL HFA;VENTOLIN HFA) 108 (90 BASE) MCG/ACT inhaler, Inhale 2 puffs into the lungs every 6 (six) hours as needed for wheezing., Disp: 18 g, Rfl: 1 carisoprodol (SOMA) 350 MG tablet, Take 1 tablet (350 mg total) by mouth 3 (three) times daily as needed for muscle spasms., Disp: 10 tablet, Rfl: 0;  ibuprofen (ADVIL,MOTRIN) 800 MG tablet, Take 1 tablet (800 mg total) by mouth every 8 (eight) hours as needed. Use for 1-2 weeks as needed for hip bursitis/pain, Disp: 60 tablet, Rfl: 0  Allergies  Allergen Reactions  . Penicillins Hives  . Percocet [Oxycodone-Acetaminophen] Itching   ROS:  Denies high fevers, chills, nausea, vomiting, diarrhea, abdominal pain, bleeding, bruising rash.  +left hip pain.  Denies wheezing or shortness of breath.  Denies chest pain, palpitations, edema.  PHYSICAL EXAM: BP 130/70  Pulse 76  Temp(Src) 98.2 F (36.8 C) (Oral)  Ht 5\' 9"  (1.753 m)  Wt 224 lb (101.606 kg)  BMI 33.06 kg/m2  LMP 11/07/2012 Pleasant female in no distress HEENT:  PERRL, EOMI.  TM's and EAC's normal.  OP clear.  Nasal mucosa mildly  edematous, no purulence or erythema. Sinuses nontender Neck: no lymphadenopathy or mass Heart: regular rate and rhythm without murmur Lungs clear bilaterally without wheezes, rales, ronchi Skin: no rash Extremities:  TTP over L trochanteric bursa, and pain with IT band stretches  ASSESSMENT/PLAN: Acute upper respiratory infections of unspecified site  Hip bursitis, left  IT band syndrome, left  URI-- likely viral.  Sinus rinses as needed. Mucinex to loosen phlegm.  Continue zyrtec daily.  Call for prescription for sinus infection if symptoms last >10 days, or are worsening after a week, rather than improving. Drink a lot of water.  Hip pain--due to bursitis and ITB syndrome.  Continue stretches, heat and ice as per PT friend at work.  Didn't tolerate ibuprofen due to GI side effects.  Could try a different NSAID in future (ie Celebrex, or other med easier on stomach).  Declines rx now, prefers to continue with conservative measures. Shown IT Band stretches. Reminded that ultram is simply masking pain, not helping with inflammation.  If IT band improves, but has persistent pain from bursitis, return for cortisone injection. Declines rx for celebrex or other NSAID today.

## 2012-11-23 NOTE — Patient Instructions (Signed)
Sinus rinses as needed. Mucinex to loosen phlegm.  Continue zyrtec daily.  Call for prescription for sinus infection if symptoms last >10 days, or are worsening after a week, rather than improving. Drink a lot of water.  Do the IT band stretches as shown. Consider cortisone shot for hip bursitis (this will NOT help the IT band--if IT band still hurting, you may want to try a different anti-inflammatory medication, rather than getting an injection, which only treats one of the two problems).  Iliotibial Band Syndrome Iliotibial band syndrome is pain in the outer, upper thigh. The pain is due to an inflammation (soreness) of the iliotibial band. This is a band of thick fibrous tissue that runs down the outside of the thigh. The iliotibial band begins at the hip. It extends to the outer side of the shin bone (tibia) just below the knee joint. The band works with the thigh muscles. Together they provide stability to the outside of the knee joint. Iliotibial band syndrome (ITBS) occurs when there is inflammation to this band of tissue. The irritation usually occurs over the outside of the knee joint, at the lateral epicondyle (the end of the femur bone.) The iliotibial band crosses bone and muscle at this point. Between these structures is a bursa (a cushioning sac). The bursa should make possible a smooth gliding motion. However, when inflamed, the iliotibial band does not glide easily. When inflamed, there is pain with motion of the knee. Usually the pain worsens with continued movement and the pain goes away with rest. This problem usually arises when there is a sudden increase in sports activities involving the lower extremities (your legs). Running, and playing soccer or basketball are examples of activities causing this. Others who are prone to ITBS include individuals with mechanical problems such as leg length differences, abnormality of walking, bowed legs etc. This diagnosis (learning what is wrong)  is made by examination. X-rays are usually normal if only soft tissue inflammation is present. Treatment of ITBS begins with proper footwear, icing the area of pain, stretching, and resting for a period of time. Incorporating low-impact cross-training activities may also help. Your caregiver may prescribe anti-inflammatory medications as well. HOME CARE INSTRUCTIONS   Apply ice to the sore area for 15-20 minutes, 3-4 times per day. Put the ice in a plastic bag and place a towel between the bag of ice and your skin.  Limit excessive training or eliminate training until pain goes away.  While pain is present, you may use gentle range of motion. Do not resume regular use until instructed by your caregiver. Begin use gradually. Do not increase use to the point of pain. If pain does develop, decrease use and continue the above measures. Gradually increase activities that do not cause discomfort. Do this until you finally achieve normal use.  Perform low impact activities while pain is present. Wear proper footwear.  Only take over-the-counter or prescription medicines for pain, discomfort, or fever as directed by your caregiver. SEEK MEDICAL CARE IF:   Your pain increases or pain is not controlled with medications.  You develop new, unexplained symptoms (problems), or an increase of the symptoms that brought you to your caregiver.  Your pain and symptoms are not improving or are getting worse. Document Released: 07/25/2001 Document Revised: 04/27/2011 Document Reviewed: 02/02/2005 [facility] Patient Information 2014 [place], [place]. Hip Bursitis Bursitis is a swelling and soreness (inflammation) of a fluid-filled sac (bursa). This sac overlies and protects the joints.  CAUSES  Injury.  Overuse of the muscles surrounding the joint.  Arthritis.  Gout.  Infection.  Cold weather.  Inadequate warm-up and conditioning prior to activities. The cause may not be known.  SYMPTOMS   Mild to  severe irritation.  Tenderness and swelling over the outside of the hip.  Pain with motion of the hip.  If the bursa becomes infected, a fever may be present. Redness, tenderness, and warmth will develop over the hip. Symptoms usually lessen in 3 to 4 weeks with treatment, but can come back. TREATMENT If conservative treatment does not work, your caregiver may advise draining the bursa and injecting cortisone into the area. This may speed up the healing process. This may also be used as an initial treatment of choice. HOME CARE INSTRUCTIONS   Apply ice to the affected area for 15-20 minutes every 3 to 4 hours while awake for the first 2 days. Put the ice in a plastic bag and place a towel between the bag of ice and your skin.  Rest the painful joint as much as possible, but continue to put the joint through a normal range of motion at least 4 times per day. When the pain lessens, begin normal, slow movements and usual activities to help prevent stiffness of the hip.  Only take over-the-counter or prescription medicines for pain, discomfort, or fever as directed by your caregiver.  Use crutches to limit weight bearing on the hip joint, if advised.  Elevate your painful hip to reduce swelling. Use pillows for propping and cushioning your legs and hips.  Gentle massage may provide comfort and decrease swelling. SEEK IMMEDIATE MEDICAL CARE IF:   Your pain increases even during treatment, or you are not improving.  You have a fever.  You have heat and inflammation over the involved bursa.  You have any other questions or concerns. MAKE SURE YOU:   Understand these instructions.  Will watch your condition.  Will get help right away if you are not doing well or get worse. Document Released: 07/25/2001 Document Revised: 04/27/2011 Document Reviewed: 02/22/2008 [facility] Patient Information 2014 [place], [place].

## 2012-11-30 ENCOUNTER — Other Ambulatory Visit: Payer: Self-pay | Admitting: Family Medicine

## 2012-12-26 ENCOUNTER — Institutional Professional Consult (permissible substitution): Payer: 59 | Admitting: Family Medicine

## 2012-12-29 ENCOUNTER — Encounter: Payer: Self-pay | Admitting: Family Medicine

## 2012-12-29 ENCOUNTER — Ambulatory Visit (INDEPENDENT_AMBULATORY_CARE_PROVIDER_SITE_OTHER): Payer: 59 | Admitting: Family Medicine

## 2012-12-29 VITALS — BP 118/76 | HR 72 | Ht 69.0 in | Wt 224.0 lb

## 2012-12-29 DIAGNOSIS — J45909 Unspecified asthma, uncomplicated: Secondary | ICD-10-CM

## 2012-12-29 DIAGNOSIS — J309 Allergic rhinitis, unspecified: Secondary | ICD-10-CM

## 2012-12-29 MED ORDER — ALBUTEROL SULFATE HFA 108 (90 BASE) MCG/ACT IN AERS: 2.0000 | INHALATION_SPRAY | Freq: Four times a day (QID) | RESPIRATORY_TRACT | Status: DC | PRN

## 2012-12-29 MED ORDER — MONTELUKAST SODIUM 10 MG PO TABS: 10.0000 mg | ORAL_TABLET | Freq: Every day | ORAL | Status: DC

## 2012-12-29 NOTE — Progress Notes (Signed)
Chief Complaint  Patient presents with  . Asthma    needs new FMLA forms filled out.    Patient presents with FMLA forms to be filled out.  Apparently the last forms were originally done in 12/2011, but when she missed 4 days of work in April due to illness, required an amendment.  But apparently they need new forms now, even though they were last changed in 05/2012.  Original forms stated she would be out for just one day (at most) related to asthma flare at work, from exposure to perfumes/flowers.  She was told that after her illness with 4 days missed work that forms needed to be changed to cover for that.  Unfortunately, copies of the amended forms from April are not in her paper chart, nor scanned into the computer, so unavailable for my review today.  Per my notes from that visit, I stated that I would not only be using the diagnosis of asthma/allergies, but also using RAD related to illnesses, which could last for longer than those symptoms related to allergy flare.  Patient states she believes she has copies of the last/amended form, and she will fax them to [place] this weekend.    She states overall she has been feeling good.  Asthma/allergies are well controlled on her current regimen.  She is requesting refills. She has been using a drink concoction of turmuric, cinnamon, clove, and many other items which she reports all has anti-inflammatory effects, and she no longer is having any hip pain or other pain.  Past Medical History  Diagnosis Date  . Allergic rhinitis, cause unspecified   . Asthma with allergic rhinitis   . Essential hypertension, benign   . Mixed hyperlipidemia   . Unspecified vitamin D deficiency   . Positive PPD     h/o BCG vaccine   Past Surgical History  Procedure Laterality Date  . Tubal ligation    . Endometrial ablation  2007    Dr. Rana Snare   History   Social History  . Marital Status: Divorced    Spouse Name: N/A    Number of Children: 2  . Years of Education:  N/A   Occupational History  . Nurse [employer]   Social History Main Topics  . Smoking status: Never Smoker   . Smokeless tobacco: Never Used  . Alcohol Use: Yes     Comment: occasional (once a month)  . Drug Use: No  . Sexual Activity: Not Currently   Other Topics Concern  . Not on file   Social History Narrative   Lives with her son; no pets. Separated from her husband.  Daughter is in the army, living in [place]   Current outpatient prescriptions:carisoprodol (SOMA) 350 MG tablet, Take 1 tablet (350 mg total) by mouth 3 (three) times daily as needed for muscle spasms., Disp: 10 tablet, Rfl: 0;  cholecalciferol (VITAMIN D) 1000 UNITS tablet, Take 1,000 Units by mouth daily.  , Disp: , Rfl: ;  fexofenadine (ALLEGRA) 180 MG tablet, Take 180 mg by mouth at bedtime. , Disp: , Rfl:  fish oil-omega-3 fatty acids 1000 MG capsule, Take 3 g by mouth daily. , Disp: , Rfl: ;  lisinopril-hydrochlorothiazide (PRINZIDE,ZESTORETIC) 20-12.5 MG per tablet, Take 1 tablet by mouth daily., Disp: , Rfl: ;  Multiple Vitamins-Minerals (MULTIVITAMIN WITH MINERALS) tablet, Take 1 tablet by mouth daily.  , Disp: , Rfl:  albuterol (PROVENTIL HFA;VENTOLIN HFA) 108 (90 BASE) MCG/ACT inhaler, Inhale 2 puffs into the lungs every  6 (six) hours as needed for wheezing., Disp: 18 g, Rfl: 1;  montelukast (SINGULAIR) 10 MG tablet, Take 1 tablet (10 mg total) by mouth at bedtime., Disp: 90 tablet, Rfl: 3;  traMADol (ULTRAM) 50 MG tablet, Take 1 tablet (50 mg total) by mouth every 6 (six) hours as needed for pain., Disp: 30 tablet, Rfl: 0  Allergies  Allergen Reactions  . Penicillins Hives  . Percocet [Oxycodone-Acetaminophen] Itching   ROS:  Denies fevers, URI symptoms, cough, shortness of breath, GI, GU complaints, bleeding, bruising, joint pains, depression/anxiety or other complaints.  PHYSICAL EXAM: BP 118/76  Pulse 72  Ht 5\' 9"  (1.753 m)  Wt 224 lb (101.606 kg)  BMI 33.06 kg/m2 Very pleasant, overweight female  in no distress Neck: no lymphadenopathy, or thyromegaly Heart: regular rate and rhythm without murmur Lungs: clear bilaterally with fair air movement.  No wheezes, rales, ronchi Extremities: no edema Skin: no rashes Psych: normal mood, affect, hygiene and grooming  Peak flow 380/400 (goal around 450)  ASSESSMENT/PLAN:  Allergic rhinitis, cause unspecified  Asthma with allergic rhinitis - Plan: albuterol (PROVENTIL HFA;VENTOLIN HFA) 108 (90 BASE) MCG/ACT inhaler, montelukast (SINGULAIR) 10 MG tablet  Pt will be sending copies of most updated FMLA form (done 05/2012), and then I will complete the new forms that she provided.

## 2013-01-02 ENCOUNTER — Telehealth: Payer: Self-pay | Admitting: *Deleted

## 2013-01-02 NOTE — Telephone Encounter (Signed)
FFO. Please scan into chart.  Fax to job as requested, and mail copy to pt.

## 2013-01-02 NOTE — Telephone Encounter (Signed)
Patient called to let you know that she does NOT have a copy of the FMLA paperwork as she thought. Once paperwork is filled out she is asking for it to be faxed to [phone] attn:Brenda Jones and then drop in the mail please.

## 2013-01-30 ENCOUNTER — Telehealth: Payer: Self-pay | Admitting: Family Medicine

## 2013-01-30 DIAGNOSIS — Z0000 Encounter for general adult medical examination without abnormal findings: Secondary | ICD-10-CM

## 2013-01-30 DIAGNOSIS — I10 Essential (primary) hypertension: Secondary | ICD-10-CM

## 2013-01-30 DIAGNOSIS — E782 Mixed hyperlipidemia: Secondary | ICD-10-CM

## 2013-01-30 DIAGNOSIS — Z79899 Other long term (current) drug therapy: Secondary | ICD-10-CM

## 2013-01-30 DIAGNOSIS — E559 Vitamin D deficiency, unspecified: Secondary | ICD-10-CM

## 2013-01-31 NOTE — Telephone Encounter (Signed)
Future orders entered.  Ok to schedule

## 2013-02-03 ENCOUNTER — Encounter (INDEPENDENT_AMBULATORY_CARE_PROVIDER_SITE_OTHER): Payer: Self-pay

## 2013-02-03 ENCOUNTER — Other Ambulatory Visit: Payer: 59

## 2013-02-03 DIAGNOSIS — I10 Essential (primary) hypertension: Secondary | ICD-10-CM

## 2013-02-03 DIAGNOSIS — E559 Vitamin D deficiency, unspecified: Secondary | ICD-10-CM

## 2013-02-03 DIAGNOSIS — E782 Mixed hyperlipidemia: Secondary | ICD-10-CM

## 2013-02-03 DIAGNOSIS — Z0000 Encounter for general adult medical examination without abnormal findings: Secondary | ICD-10-CM

## 2013-02-03 DIAGNOSIS — Z79899 Other long term (current) drug therapy: Secondary | ICD-10-CM

## 2013-02-03 LAB — CBC WITH DIFFERENTIAL/PLATELET
Hemoglobin: 12.7 g/dL (ref 12.0–15.0)
Lymphs Abs: 1.5 10*3/uL (ref 0.7–4.0)
MCH: 29.6 pg (ref 26.0–34.0)
Monocytes Relative: 7 % (ref 3–12)
Neutro Abs: 1.7 10*3/uL (ref 1.7–7.7)
Neutrophils Relative %: 50 % (ref 43–77)
RBC: 4.29 MIL/uL (ref 3.87–5.11)

## 2013-02-03 LAB — LIPID PANEL
Cholesterol: 208 mg/dL — ABNORMAL HIGH (ref 0–200)
HDL: 42 mg/dL (ref >39)
Total CHOL/HDL Ratio: 5 Ratio
Triglycerides: 87 mg/dL (ref <150)
VLDL: 17 mg/dL (ref 0–40)

## 2013-02-03 LAB — COMPREHENSIVE METABOLIC PANEL
CO2: 23 mEq/L (ref 19–32)
Creat: 0.97 mg/dL (ref 0.50–1.10)
Glucose, Bld: 80 mg/dL (ref 70–99)
Total Bilirubin: 0.5 mg/dL (ref 0.3–1.2)
Total Protein: 7.1 g/dL (ref 6.0–8.3)

## 2013-02-06 ENCOUNTER — Encounter: Payer: Self-pay | Admitting: Family Medicine

## 2013-02-24 ENCOUNTER — Other Ambulatory Visit: Payer: Self-pay | Admitting: Family Medicine

## 2013-02-24 NOTE — Telephone Encounter (Signed)
Done--refilled ibuprofen, denied tramadol (not seen for hip pain since October, recently canceled her CPE)

## 2013-02-24 NOTE — Telephone Encounter (Signed)
Is these 2 medications okay to refill?

## 2013-03-19 ENCOUNTER — Emergency Department (HOSPITAL_COMMUNITY): Payer: 59

## 2013-03-19 ENCOUNTER — Emergency Department (HOSPITAL_COMMUNITY)
Admission: EM | Admit: 2013-03-19 | Discharge: 2013-03-19 | Disposition: A | Discharge status: Home / Self Care | Payer: 59 | Attending: Emergency Medicine | Admitting: Emergency Medicine

## 2013-03-19 ENCOUNTER — Encounter (HOSPITAL_COMMUNITY): Payer: Self-pay | Admitting: Emergency Medicine

## 2013-03-19 DIAGNOSIS — E876 Hypokalemia: Secondary | ICD-10-CM | POA: Insufficient documentation

## 2013-03-19 DIAGNOSIS — J45909 Unspecified asthma, uncomplicated: Secondary | ICD-10-CM | POA: Insufficient documentation

## 2013-03-19 DIAGNOSIS — I10 Essential (primary) hypertension: Secondary | ICD-10-CM | POA: Insufficient documentation

## 2013-03-19 DIAGNOSIS — R112 Nausea with vomiting, unspecified: Secondary | ICD-10-CM | POA: Insufficient documentation

## 2013-03-19 DIAGNOSIS — M25559 Pain in unspecified hip: Secondary | ICD-10-CM | POA: Insufficient documentation

## 2013-03-19 DIAGNOSIS — Z79899 Other long term (current) drug therapy: Secondary | ICD-10-CM | POA: Insufficient documentation

## 2013-03-19 DIAGNOSIS — M25551 Pain in right hip: Secondary | ICD-10-CM

## 2013-03-19 DIAGNOSIS — Z3202 Encounter for pregnancy test, result negative: Secondary | ICD-10-CM | POA: Insufficient documentation

## 2013-03-19 DIAGNOSIS — Z880 Allergy status to penicillin: Secondary | ICD-10-CM | POA: Insufficient documentation

## 2013-03-19 DIAGNOSIS — R1031 Right lower quadrant pain: Secondary | ICD-10-CM | POA: Insufficient documentation

## 2013-03-19 DIAGNOSIS — E559 Vitamin D deficiency, unspecified: Secondary | ICD-10-CM | POA: Insufficient documentation

## 2013-03-19 DIAGNOSIS — D259 Leiomyoma of uterus, unspecified: Secondary | ICD-10-CM | POA: Insufficient documentation

## 2013-03-19 DIAGNOSIS — D219 Benign neoplasm of connective and other soft tissue, unspecified: Secondary | ICD-10-CM

## 2013-03-19 LAB — CBC WITH DIFFERENTIAL/PLATELET
BASOS ABS: 0 10*3/uL (ref 0.0–0.1)
BASOS PCT: 0 % (ref 0–1)
EOS ABS: 0 10*3/uL (ref 0.0–0.7)
Eosinophils Relative: 0 % (ref 0–5)
HCT: 40.3 % (ref 36.0–46.0)
HEMOGLOBIN: 13.9 g/dL (ref 12.0–15.0)
Lymphocytes Relative: 14 % (ref 12–46)
Lymphs Abs: 1.2 10*3/uL (ref 0.7–4.0)
MCH: 30.4 pg (ref 26.0–34.0)
MCHC: 34.5 g/dL (ref 30.0–36.0)
MCV: 88.2 fL (ref 78.0–100.0)
MONO ABS: 0.2 10*3/uL (ref 0.1–1.0)
MONOS PCT: 2 % — AB (ref 3–12)
NEUTROS PCT: 84 % — AB (ref 43–77)
Neutro Abs: 7.2 10*3/uL (ref 1.7–7.7)
Platelets: 181 10*3/uL (ref 150–400)
RBC: 4.57 MIL/uL (ref 3.87–5.11)
RDW: 12.8 % (ref 11.5–15.5)
WBC: 8.6 10*3/uL (ref 4.0–10.5)

## 2013-03-19 LAB — POCT I-STAT, CHEM 8
BUN: 13 mg/dL (ref 6–23)
CALCIUM ION: 1.19 mmol/L (ref 1.12–1.23)
Chloride: 101 mEq/L (ref 96–112)
Creatinine, Ser: 1.1 mg/dL (ref 0.50–1.10)
GLUCOSE: 103 mg/dL — AB (ref 70–99)
HEMATOCRIT: 47 % — AB (ref 36.0–46.0)
HEMOGLOBIN: 16 g/dL — AB (ref 12.0–15.0)
POTASSIUM: 3.3 meq/L — AB (ref 3.7–5.3)
Sodium: 140 mEq/L (ref 137–147)
TCO2: 26 mmol/L (ref 0–100)

## 2013-03-19 LAB — WET PREP, GENITAL
Clue Cells Wet Prep HPF POC: NONE SEEN
Trich, Wet Prep: NONE SEEN
Yeast Wet Prep HPF POC: NONE SEEN

## 2013-03-19 MED ORDER — POTASSIUM CHLORIDE CRYS ER 20 MEQ PO TBCR: 20.0000 meq | EXTENDED_RELEASE_TABLET | Freq: Every day | ORAL | Status: DC

## 2013-03-19 MED ORDER — ONDANSETRON HCL 4 MG/2ML IJ SOLN
4.0000 mg | Freq: Once | INTRAMUSCULAR | Status: AC
  Administered 2013-03-19: 4 mg via INTRAVENOUS
  Filled 2013-03-19: qty 2

## 2013-03-19 MED ORDER — SODIUM CHLORIDE 0.9 % IV BOLUS (SEPSIS)
1000.0000 mL | Freq: Once | INTRAVENOUS | Status: AC
  Administered 2013-03-19: 1000 mL via INTRAVENOUS

## 2013-03-19 MED ORDER — IOHEXOL 300 MG/ML  SOLN
100.0000 mL | Freq: Once | INTRAMUSCULAR | Status: AC | PRN
  Administered 2013-03-19: 100 mL via INTRAVENOUS

## 2013-03-19 MED ORDER — POTASSIUM CHLORIDE CRYS ER 20 MEQ PO TBCR
40.0000 meq | EXTENDED_RELEASE_TABLET | Freq: Once | ORAL | Status: AC
Start: 2013-03-19 — End: 2013-03-19
  Administered 2013-03-19: 40 meq via ORAL
  Filled 2013-03-19: qty 2

## 2013-03-19 MED ORDER — IOHEXOL 300 MG/ML  SOLN
25.0000 mL | INTRAMUSCULAR | Status: AC
  Administered 2013-03-19: 25 mL via ORAL

## 2013-03-19 MED ORDER — KETOROLAC TROMETHAMINE 30 MG/ML IJ SOLN
30.0000 mg | Freq: Once | INTRAMUSCULAR | Status: AC
Start: 2013-03-19 — End: 2013-03-19
  Administered 2013-03-19: 30 mg via INTRAVENOUS
  Filled 2013-03-19: qty 1

## 2013-03-19 NOTE — ED Notes (Addendum)
To ED for eval of nausea, vomiting, and rlq abd pain since last night. Unknown fever. Pt took 800mg  ibuprofen this am at 8:45. Tearful. Pain increases with walking. Normal bm this and normal. Pt states she doesn't have a menses anymore since tubal and ablation.

## 2013-03-19 NOTE — ED Notes (Signed)
Called CT to inform pt finished with contrast and ready for scan. Was told pt 5th in line.

## 2013-03-19 NOTE — ED Provider Notes (Signed)
CSN: [id number]     Arrival date & time 03/19/13  1041 History   First MD Initiated Contact with Patient 03/19/13 1129     Chief Complaint  Patient presents with  . Abdominal Pain   (Consider location/radiation/quality/duration/timing/severity/associated sxs/prior Treatment) The history is provided by the patient. No language interpreter was used.  Candace Walker is a 43 year old female with past medical history of essential hypertension, unspecified vitamin D deficiency, asthma with allergic rhinitis, mixed hyperlipidemia, positive PPD presenting to emergency department with right lower pelvic/abdominal pain that has been ongoing since yesterday-with sudden onset. Patient reported that the pain is an aching, throbbing sensation that is constant with radiation down her right leg all the way to her toes. Stated that the radiation of this pain is intermittent. Patient reported that she's been feeling nauseous since the event started with at least 2 episodes of emesis this morning mainly of food contents-NB/NB. Patient reports she is unable to keep anything down at the moment. Stated that her last menstrual period she is unaware of secondary to tubal ligation and uterine ablation performed in 2007. Denied chest pain, shortness of breath, difficulty breathing, fever, chills, diarrhea, melena, hematochezia, dizziness, weakness, dysuria, urinary symptoms, vaginal bleeding, vaginal discharge, vaginal pain. Patient reported that she's not sexually active. PCP Dr. Tomi Bamberger OB/GYN Dr. Corinna Capra  Past Medical History  Diagnosis Date  . Allergic rhinitis, cause unspecified   . Asthma with allergic rhinitis   . Essential hypertension, benign   . Mixed hyperlipidemia   . Unspecified vitamin D deficiency   . Positive PPD     h/o BCG vaccine   Past Surgical History  Procedure Laterality Date  . Tubal ligation    . Endometrial ablation  2007    Dr. Corinna Capra   Family History  Problem Relation Age of Onset  .  Diabetes Mother   . Hypertension Mother   . Allergies Mother   . Heart disease Father 3    MI  . Hyperlipidemia Father   . Hypertension Father   . Cancer Paternal Grandmother     breast cancer in 78's   History  Substance Use Topics  . Smoking status: Never Smoker   . Smokeless tobacco: Never Used  . Alcohol Use: Yes     Comment: occasional (once a month)   OB History   Grav Para Term Preterm Abortions TAB SAB Ect Mult Living   22 22             Review of Systems  Constitutional: Negative for fever and chills.  Respiratory: Negative for chest tightness and shortness of breath.   Cardiovascular: Negative for chest pain.  Gastrointestinal: Positive for nausea, vomiting and abdominal pain (Right lower quadrant). Negative for diarrhea, constipation, blood in stool and anal bleeding.  Genitourinary: Positive for pelvic pain (right). Negative for dysuria, decreased urine volume, vaginal bleeding, vaginal discharge and vaginal pain.  Musculoskeletal: Negative for back pain and neck pain.  All other systems reviewed and are negative.    Allergies  Penicillins and Percocet  Home Medications   Current Outpatient Rx  Name  Route  Sig  Dispense  Refill  . albuterol (PROVENTIL HFA;VENTOLIN HFA) 108 (90 BASE) MCG/ACT inhaler   Inhalation   Inhale 2 puffs into the lungs every 6 (six) hours as needed for wheezing.   18 g   1   . cholecalciferol (VITAMIN D) 1000 UNITS tablet   Oral   Take 1,000 Units by mouth daily.           Marland Kitchen  fexofenadine (ALLEGRA) 180 MG tablet   Oral   Take 180 mg by mouth at bedtime.          . fish oil-omega-3 fatty acids 1000 MG capsule   Oral   Take 3 g by mouth daily.          Marland Kitchen lisinopril-hydrochlorothiazide (PRINZIDE,ZESTORETIC) 20-12.5 MG per tablet   Oral   Take 1 tablet by mouth daily.         . montelukast (SINGULAIR) 10 MG tablet   Oral   Take 1 tablet (10 mg total) by mouth at bedtime.   90 tablet   3   . Multiple  Vitamins-Minerals (MULTIVITAMIN WITH MINERALS) tablet   Oral   Take 1 tablet by mouth daily.           . potassium chloride SA (K-DUR,KLOR-CON) 20 MEQ tablet   Oral   Take 1 tablet (20 mEq total) by mouth daily.   3 tablet   0    BP 112/64  Pulse 69  Temp(Src) 98.1 F (36.7 C) (Oral)  Resp 16  SpO2 100% Physical Exam  Nursing note and vitals reviewed. Constitutional: She is oriented to person, place, and time. She appears well-developed and well-nourished. No distress.  HENT:  Head: Normocephalic and atraumatic.  Mouth/Throat: Oropharynx is clear and moist. No oropharyngeal exudate.  Eyes: Conjunctivae and EOM are normal. Pupils are equal, round, and reactive to light. Right eye exhibits no discharge. Left eye exhibits no discharge.  Neck: Normal range of motion. Neck supple. No tracheal deviation present.  Cardiovascular: Normal rate, regular rhythm and normal heart sounds.  Exam reveals no friction rub.   No murmur heard. Pulmonary/Chest: Effort normal and breath sounds normal. No respiratory distress. She has no wheezes. She has no rales.  Abdominal: Soft. Normal appearance and bowel sounds are normal. There is tenderness in the right lower quadrant. There is no guarding.    Bowel sounds normoactive in all 4 quadrants Soft upon palpation Discomfort upon palpation to the right lower quadrant  Genitourinary:  Pelvic exam: Negative swelling, erythema, inflammation, lesions, sores noted to the external genitalia. Negative erythema, inflammation, lesions, sores, masses or lesions noted vaginal canal. Cervical os identified with negative friability, erythema, inflammation or abnormalities noted. Negative active drainage or bleeding noted. Negative blood in the vaginal vault. Negative discharge noted. Negative CMT. Negative bilateral adnexal tenderness. Exam chaperoned  Musculoskeletal: Normal range of motion. She exhibits no tenderness.  Negative deformities noted to the right  hip and pelvic region. Negative pain upon palpation to the right ischium or acetabulum. Full range of motion noted to the right hip without difficulty.  Full ROM to upper and lower extremities without difficulty noted, negative ataxia noted.  Lymphadenopathy:    She has no cervical adenopathy.  Neurological: She is alert and oriented to person, place, and time. No cranial nerve deficit. She exhibits normal muscle tone. Coordination normal.  Cranial nerves III-XII grossly intact Strength 5+/5+ to upper and lower extremities bilaterally with resistance applied, equal distribution noted Strength intact to the digits of bilateral feet Equal grip strength Gait proper, proper balance - negative sway, negative drift, negative step-offs  Skin: Skin is warm and dry. No rash noted. She is not diaphoretic. No erythema.  Psychiatric: She has a normal mood and affect. Her behavior is normal. Thought content normal.    ED Course  Procedures (including critical care time)  Results for orders placed during the hospital encounter of 03/19/13  WET PREP, GENITAL  Result Value Range   Yeast Wet Prep HPF POC NONE SEEN  NONE SEEN   Trich, Wet Prep NONE SEEN  NONE SEEN   Clue Cells Wet Prep HPF POC NONE SEEN  NONE SEEN   WBC, Wet Prep HPF POC FEW (*) NONE SEEN  CBC WITH DIFFERENTIAL      Result Value Range   WBC 8.6  4.0 - 10.5 K/uL   RBC 4.57  3.87 - 5.11 MIL/uL   Hemoglobin 13.9  12.0 - 15.0 g/dL   HCT 40.3  36.0 - 46.0 %   MCV 88.2  78.0 - 100.0 fL   MCH 30.4  26.0 - 34.0 pg   MCHC 34.5  30.0 - 36.0 g/dL   RDW 12.8  11.5 - 15.5 %   Platelets 181  150 - 400 K/uL   Neutrophils Relative % 84 (*) 43 - 77 %   Neutro Abs 7.2  1.7 - 7.7 K/uL   Lymphocytes Relative 14  12 - 46 %   Lymphs Abs 1.2  0.7 - 4.0 K/uL   Monocytes Relative 2 (*) 3 - 12 %   Monocytes Absolute 0.2  0.1 - 1.0 K/uL   Eosinophils Relative 0  0 - 5 %   Eosinophils Absolute 0.0  0.0 - 0.7 K/uL   Basophils Relative 0  0 - 1 %    Basophils Absolute 0.0  0.0 - 0.1 K/uL  POCT I-STAT, CHEM 8      Result Value Range   Sodium 140  137 - 147 mEq/L   Potassium 3.3 (*) 3.7 - 5.3 mEq/L   Chloride 101  96 - 112 mEq/L   BUN 13  6 - 23 mg/dL   Creatinine, Ser 1.10  0.50 - 1.10 mg/dL   Glucose, Bld 103 (*) 70 - 99 mg/dL   Calcium, Ion 1.19  1.12 - 1.23 mmol/L   TCO2 26  0 - 100 mmol/L   Hemoglobin 16.0 (*) 12.0 - 15.0 g/dL   HCT 47.0 (*) 36.0 - 46.0 %  POCT PREGNANCY, URINE      Result Value Range   Preg Test, Ur NEGATIVE  NEGATIVE   Dg Hip Bilateral W/pelvis  03/19/2013   CLINICAL DATA:  Hip pain  EXAM: BILATERAL HIP WITH PELVIS - 4+ VIEW  COMPARISON:  None.  FINDINGS: There is no evidence of hip fracture or dislocation. There is no evidence of arthropathy or other focal bone abnormality.  IMPRESSION: Negative.   Electronically Signed   By: Kathreen Devoid   On: 03/19/2013 15:35   US Transvaginal Non-ob  03/19/2013   CLINICAL DATA:  History of uterine ablation, pelvic pain concern for torsion  EXAM: TRANSABDOMINAL AND TRANSVAGINAL ULTRASOUND OF PELVIS  DOPPLER ULTRASOUND OF OVARIES  TECHNIQUE: Both transabdominal and transvaginal ultrasound examinations of the pelvis were performed. Transabdominal technique was performed for global imaging of the pelvis including uterus, ovaries, adnexal regions, and pelvic cul-de-sac.  It was necessary to proceed with endovaginal exam following the transabdominal exam to visualize the ovaries and endometrium. Color and duplex Doppler ultrasound was utilized to evaluate blood flow to the ovaries.  COMPARISON:  None.  FINDINGS: Uterus  Measurements: 7.8 x 4.1 x 5.5 cm. The uterus is anteverted and anteflexed. There is a 24 x 21 x 17 mm mass in the right fundus and a 27 x 19 x 23 mm mass in the left fundus. These lesions are essentially isoechoic and mildly heterogeneous.  Endometrium  Thickness: Endometrium shows heterogeneous echotexture  and contour with color Doppler vascularity. The endometrium  measures about 10 mm in diameter.  Right ovary  Measurements: 29 x 15 x 16 mm. Normal appearance/no adnexal mass.  Left ovary  Measurements: 23 x 10 x 15 mm. Normal appearance/no adnexal mass.  Pulsed Doppler evaluation of both ovaries demonstrates normal low-resistance arterial and venous waveforms.  Other findings  There are a few cervical nabothian cysts.  IMPRESSION: 1. Findings consistent with 2 fibroids with in the uterus. 2. Endometrium is heterogeneous, mildly hypervascular, and prominent. Differential diagnostic possibilities include endometrial hyperplasia, polyp, or carcinoma. Consider further evaluation with sonohysterography. 3. No evidence of ovarian torsion   Electronically Signed   By: Esperanza Heir M.D.   On: 03/19/2013 14:34   US Pelvis Complete  03/19/2013   CLINICAL DATA:  History of uterine ablation, pelvic pain concern for torsion  EXAM: TRANSABDOMINAL AND TRANSVAGINAL ULTRASOUND OF PELVIS  DOPPLER ULTRASOUND OF OVARIES  TECHNIQUE: Both transabdominal and transvaginal ultrasound examinations of the pelvis were performed. Transabdominal technique was performed for global imaging of the pelvis including uterus, ovaries, adnexal regions, and pelvic cul-de-sac.  It was necessary to proceed with endovaginal exam following the transabdominal exam to visualize the ovaries and endometrium. Color and duplex Doppler ultrasound was utilized to evaluate blood flow to the ovaries.  COMPARISON:  None.  FINDINGS: Uterus  Measurements: 7.8 x 4.1 x 5.5 cm. The uterus is anteverted and anteflexed. There is a 24 x 21 x 17 mm mass in the right fundus and a 27 x 19 x 23 mm mass in the left fundus. These lesions are essentially isoechoic and mildly heterogeneous.  Endometrium  Thickness: Endometrium shows heterogeneous echotexture and contour with color Doppler vascularity. The endometrium measures about 10 mm in diameter.  Right ovary  Measurements: 29 x 15 x 16 mm. Normal appearance/no adnexal mass.  Left  ovary  Measurements: 23 x 10 x 15 mm. Normal appearance/no adnexal mass.  Pulsed Doppler evaluation of both ovaries demonstrates normal low-resistance arterial and venous waveforms.  Other findings  There are a few cervical nabothian cysts.  IMPRESSION: 1. Findings consistent with 2 fibroids with in the uterus. 2. Endometrium is heterogeneous, mildly hypervascular, and prominent. Differential diagnostic possibilities include endometrial hyperplasia, polyp, or carcinoma. Consider further evaluation with sonohysterography. 3. No evidence of ovarian torsion   Electronically Signed   By: Esperanza Heir M.D.   On: 03/19/2013 14:34   Ct Abdomen Pelvis W Contrast  03/19/2013   CLINICAL DATA:  Nausea and vomiting. Right lower quadrant abdominal pain and fever.  EXAM: CT ABDOMEN AND PELVIS WITH CONTRAST  TECHNIQUE: Multidetector CT imaging of the abdomen and pelvis was performed using the standard protocol following bolus administration of intravenous contrast.  CONTRAST:  OMNIPAQUE IOHEXOL 300 MG/ML  SOLN  COMPARISON:  DG HIP BILATERAL W/PELVIS dated 03/19/2013; CT ABD/PELVIS W CM dated 09/28/2012; US PELVIS COMPLETE dated 03/19/2013; CT ABD/PELV WO CM dated 05/04/2010  FINDINGS: Scattered hepatic cysts similar in size in distribution to prior exam. Some of these are bilobed or have thin internal septation.  Spleen, pancreas, adrenal glands, and gallbladder unremarkable. Small hiatal hernia.  Small peripancreatic lymph nodes are similar to prior. No biliary dilatation.  No renal calculi or observed abnormal enhancement along the urothelium. Small retroperitoneal lymph nodes are noted but are not pathologically enlarged by size criteria.  No pathologic pelvic adenopathy is observed. Appendix normal. Terminal ileum unremarkable. No right lower quadrant inflammatory process observed. No significantly dilated small bowel.  Orally administered contrast makes its way through to the distal colon. No significant adnexal contour  abnormality.  There is sclerosis anteriorly in the right femoral head with a small central lucency as shown on image 26 of series 80425, similar to the 09/28/2012 exam.  IMPRESSION: 1. Appendix normal. No acute findings in the right lower quadrant explain patient's current symptoms. 2. Stable anterior sclerosis of the right femoral head with a small central lucency, possibly from avascular necrosis or degenerative arthropathy. 3. Stable polycystic liver disease. 4. Small hiatal hernia.   Electronically Signed   By: Sherryl Barters M.D.   On: 03/19/2013 18:50   [place] Art/ven Flow Abd Pelv Doppler  03/19/2013   CLINICAL DATA:  History of uterine ablation, pelvic pain concern for torsion  EXAM: TRANSABDOMINAL AND TRANSVAGINAL ULTRASOUND OF PELVIS  DOPPLER ULTRASOUND OF OVARIES  TECHNIQUE: Both transabdominal and transvaginal ultrasound examinations of the pelvis were performed. Transabdominal technique was performed for global imaging of the pelvis including uterus, ovaries, adnexal regions, and pelvic cul-de-sac.  It was necessary to proceed with endovaginal exam following the transabdominal exam to visualize the ovaries and endometrium. Color and duplex Doppler ultrasound was utilized to evaluate blood flow to the ovaries.  COMPARISON:  None.  FINDINGS: Uterus  Measurements: 7.8 x 4.1 x 5.5 cm. The uterus is anteverted and anteflexed. There is a 24 x 21 x 17 mm mass in the right fundus and a 27 x 19 x 23 mm mass in the left fundus. These lesions are essentially isoechoic and mildly heterogeneous.  Endometrium  Thickness: Endometrium shows heterogeneous echotexture and contour with color Doppler vascularity. The endometrium measures about 10 mm in diameter.  Right ovary  Measurements: 29 x 15 x 16 mm. Normal appearance/no adnexal mass.  Left ovary  Measurements: 23 x 10 x 15 mm. Normal appearance/no adnexal mass.  Pulsed Doppler evaluation of both ovaries demonstrates normal low-resistance arterial and venous  waveforms.  Other findings  There are a few cervical nabothian cysts.  IMPRESSION: 1. Findings consistent with 2 fibroids with in the uterus. 2. Endometrium is heterogeneous, mildly hypervascular, and prominent. Differential diagnostic possibilities include endometrial hyperplasia, polyp, or carcinoma. Consider further evaluation with sonohysterography. 3. No evidence of ovarian torsion   Electronically Signed   By: Skipper Cliche M.D.   On: 03/19/2013 14:34   Labs Review Labs Reviewed  WET PREP, GENITAL - Abnormal; Notable for the following:    WBC, Wet Prep HPF POC FEW (*)    All other components within normal limits  CBC WITH DIFFERENTIAL - Abnormal; Notable for the following:    Neutrophils Relative % 84 (*)    Monocytes Relative 2 (*)    All other components within normal limits  POCT I-STAT, CHEM 8 - Abnormal; Notable for the following:    Potassium 3.3 (*)    Glucose, Bld 103 (*)    Hemoglobin 16.0 (*)    HCT 47.0 (*)    All other components within normal limits  GC/CHLAMYDIA PROBE AMP  URINALYSIS, ROUTINE W REFLEX MICROSCOPIC   Imaging Review Dg Hip Bilateral W/pelvis  03/19/2013   CLINICAL DATA:  Hip pain  EXAM: BILATERAL HIP WITH PELVIS - 4+ VIEW  COMPARISON:  None.  FINDINGS: There is no evidence of hip fracture or dislocation. There is no evidence of arthropathy or other focal bone abnormality.  IMPRESSION: Negative.   Electronically Signed   By: Kathreen Devoid   On: 03/19/2013 15:35   US Transvaginal Non-ob  03/19/2013  CLINICAL DATA:  History of uterine ablation, pelvic pain concern for torsion  EXAM: TRANSABDOMINAL AND TRANSVAGINAL ULTRASOUND OF PELVIS  DOPPLER ULTRASOUND OF OVARIES  TECHNIQUE: Both transabdominal and transvaginal ultrasound examinations of the pelvis were performed. Transabdominal technique was performed for global imaging of the pelvis including uterus, ovaries, adnexal regions, and pelvic cul-de-sac.  It was necessary to proceed with endovaginal exam  following the transabdominal exam to visualize the ovaries and endometrium. Color and duplex Doppler ultrasound was utilized to evaluate blood flow to the ovaries.  COMPARISON:  None.  FINDINGS: Uterus  Measurements: 7.8 x 4.1 x 5.5 cm. The uterus is anteverted and anteflexed. There is a 24 x 21 x 17 mm mass in the right fundus and a 27 x 19 x 23 mm mass in the left fundus. These lesions are essentially isoechoic and mildly heterogeneous.  Endometrium  Thickness: Endometrium shows heterogeneous echotexture and contour with color Doppler vascularity. The endometrium measures about 10 mm in diameter.  Right ovary  Measurements: 29 x 15 x 16 mm. Normal appearance/no adnexal mass.  Left ovary  Measurements: 23 x 10 x 15 mm. Normal appearance/no adnexal mass.  Pulsed Doppler evaluation of both ovaries demonstrates normal low-resistance arterial and venous waveforms.  Other findings  There are a few cervical nabothian cysts.  IMPRESSION: 1. Findings consistent with 2 fibroids with in the uterus. 2. Endometrium is heterogeneous, mildly hypervascular, and prominent. Differential diagnostic possibilities include endometrial hyperplasia, polyp, or carcinoma. Consider further evaluation with sonohysterography. 3. No evidence of ovarian torsion   Electronically Signed   By: Skipper Cliche M.D.   On: 03/19/2013 14:34   US Pelvis Complete  03/19/2013   CLINICAL DATA:  History of uterine ablation, pelvic pain concern for torsion  EXAM: TRANSABDOMINAL AND TRANSVAGINAL ULTRASOUND OF PELVIS  DOPPLER ULTRASOUND OF OVARIES  TECHNIQUE: Both transabdominal and transvaginal ultrasound examinations of the pelvis were performed. Transabdominal technique was performed for global imaging of the pelvis including uterus, ovaries, adnexal regions, and pelvic cul-de-sac.  It was necessary to proceed with endovaginal exam following the transabdominal exam to visualize the ovaries and endometrium. Color and duplex Doppler ultrasound was utilized  to evaluate blood flow to the ovaries.  COMPARISON:  None.  FINDINGS: Uterus  Measurements: 7.8 x 4.1 x 5.5 cm. The uterus is anteverted and anteflexed. There is a 24 x 21 x 17 mm mass in the right fundus and a 27 x 19 x 23 mm mass in the left fundus. These lesions are essentially isoechoic and mildly heterogeneous.  Endometrium  Thickness: Endometrium shows heterogeneous echotexture and contour with color Doppler vascularity. The endometrium measures about 10 mm in diameter.  Right ovary  Measurements: 29 x 15 x 16 mm. Normal appearance/no adnexal mass.  Left ovary  Measurements: 23 x 10 x 15 mm. Normal appearance/no adnexal mass.  Pulsed Doppler evaluation of both ovaries demonstrates normal low-resistance arterial and venous waveforms.  Other findings  There are a few cervical nabothian cysts.  IMPRESSION: 1. Findings consistent with 2 fibroids with in the uterus. 2. Endometrium is heterogeneous, mildly hypervascular, and prominent. Differential diagnostic possibilities include endometrial hyperplasia, polyp, or carcinoma. Consider further evaluation with sonohysterography. 3. No evidence of ovarian torsion   Electronically Signed   By: Skipper Cliche M.D.   On: 03/19/2013 14:34   Ct Abdomen Pelvis W Contrast  03/19/2013   CLINICAL DATA:  Nausea and vomiting. Right lower quadrant abdominal pain and fever.  EXAM: CT ABDOMEN AND PELVIS WITH CONTRAST  TECHNIQUE: Multidetector CT imaging of the abdomen and pelvis was performed using the standard protocol following bolus administration of intravenous contrast.  CONTRAST:  186mL OMNIPAQUE IOHEXOL 300 MG/ML  SOLN  COMPARISON:  DG HIP BILATERAL W/PELVIS dated 03/19/2013; CT ABD/PELVIS W CM dated 09/28/2012; US PELVIS COMPLETE dated 03/19/2013; CT ABD/PELV WO CM dated 05/04/2010  FINDINGS: Scattered hepatic cysts similar in size in distribution to prior exam. Some of these are bilobed or have thin internal septation.  Spleen, pancreas, adrenal glands, and gallbladder  unremarkable. Small hiatal hernia.  Small peripancreatic lymph nodes are similar to prior. No biliary dilatation.  No renal calculi or observed abnormal enhancement along the urothelium. Small retroperitoneal lymph nodes are noted but are not pathologically enlarged by size criteria.  No pathologic pelvic adenopathy is observed. Appendix normal. Terminal ileum unremarkable. No right lower quadrant inflammatory process observed. No significantly dilated small bowel.  Orally administered contrast makes its way through to the distal colon. No significant adnexal contour abnormality.  There is sclerosis anteriorly in the right femoral head with a small central lucency as shown on image 26 of series 80425, similar to the 09/28/2012 exam.  IMPRESSION: 1. Appendix normal. No acute findings in the right lower quadrant explain patient's current symptoms. 2. Stable anterior sclerosis of the right femoral head with a small central lucency, possibly from avascular necrosis or degenerative arthropathy. 3. Stable polycystic liver disease. 4. Small hiatal hernia.   Electronically Signed   By: Sherryl Barters M.D.   On: 03/19/2013 18:50   [place] Art/ven Flow Abd Pelv Doppler  03/19/2013   CLINICAL DATA:  History of uterine ablation, pelvic pain concern for torsion  EXAM: TRANSABDOMINAL AND TRANSVAGINAL ULTRASOUND OF PELVIS  DOPPLER ULTRASOUND OF OVARIES  TECHNIQUE: Both transabdominal and transvaginal ultrasound examinations of the pelvis were performed. Transabdominal technique was performed for global imaging of the pelvis including uterus, ovaries, adnexal regions, and pelvic cul-de-sac.  It was necessary to proceed with endovaginal exam following the transabdominal exam to visualize the ovaries and endometrium. Color and duplex Doppler ultrasound was utilized to evaluate blood flow to the ovaries.  COMPARISON:  None.  FINDINGS: Uterus  Measurements: 7.8 x 4.1 x 5.5 cm. The uterus is anteverted and anteflexed. There is a 24 x 21  x 17 mm mass in the right fundus and a 27 x 19 x 23 mm mass in the left fundus. These lesions are essentially isoechoic and mildly heterogeneous.  Endometrium  Thickness: Endometrium shows heterogeneous echotexture and contour with color Doppler vascularity. The endometrium measures about 10 mm in diameter.  Right ovary  Measurements: 29 x 15 x 16 mm. Normal appearance/no adnexal mass.  Left ovary  Measurements: 23 x 10 x 15 mm. Normal appearance/no adnexal mass.  Pulsed Doppler evaluation of both ovaries demonstrates normal low-resistance arterial and venous waveforms.  Other findings  There are a few cervical nabothian cysts.  IMPRESSION: 1. Findings consistent with 2 fibroids with in the uterus. 2. Endometrium is heterogeneous, mildly hypervascular, and prominent. Differential diagnostic possibilities include endometrial hyperplasia, polyp, or carcinoma. Consider further evaluation with sonohysterography. 3. No evidence of ovarian torsion   Electronically Signed   By: Skipper Cliche M.D.   On: 03/19/2013 14:34    EKG Interpretation   None       MDM   1. Right lower quadrant pain   2. Right hip pain   3. Fibroids   4. Hypokalemia     Medications  iohexol (OMNIPAQUE) 300 MG/ML solution 25  mL (25 mLs Oral Contrast Given 03/19/13 1610)  sodium chloride 0.9 % bolus 1,000 mL (0 mLs Intravenous Stopped 03/19/13 1800)  ondansetron (ZOFRAN) injection 4 mg (4 mg Intravenous Given 03/19/13 1311)  ketorolac (TORADOL) 30 MG/ML injection 30 mg (30 mg Intravenous Given 03/19/13 1311)  potassium chloride SA (K-DUR,KLOR-CON) CR tablet 40 mEq (40 mEq Oral Given 03/19/13 1844)  iohexol (OMNIPAQUE) 300 MG/ML solution 100 mL (100 mLs Intravenous Contrast Given 03/19/13 1810)   Filed Vitals:   03/19/13 1541 03/19/13 1600 03/19/13 1700 03/19/13 1730  BP: 105/55 101/53 115/66 112/64  Pulse: 76 74 66 69  Temp:      TempSrc:      Resp: 16     SpO2: 100% 100% 100% 100%    Patient presenting to emergency department  with right pelvic pain/appears to be close to the groin has been ongoing since yesterday with sudden onset described as an aching throbbing sensation with radiation down the right leg intermittently. Patient reported that she had similar episodes approximately one year ago with negative findings on her abdomen and pelvic exam. Reported that she was due to get a MRI performed-reported that this was canceled in September 2014 secondary to patient having an anxiety attack. This provider reviewed patient's chart. Patient is been seen multiple times by her primary care provider regarding right hip pain. Patient was diagnosed with right hip bursitis. Patient was to have a MRI of the right hip performed on 11/09/2012-according to patient's report, reported that this did not have been secondary to anxiety attack during the procedure. Alert and oriented. GCS 15. Heart rate and rhythm normal. Lungs clear to auscultation. Radial and DP pulses 2+ bilaterally. Bowel sounds normoactive in all 4 quadrants, soft upon palpation. Discomfort upon palpation to right lower quadrant-positive McBurney's point. Negative Rovsing's. Negative Murphy's sign. Discomfort upon palpation to right groin region and right ischium. Negative pain upon palpation to iliac crest or right acetabulum. Full range of motion to lower extremities without difficulty or ataxia. Strength intact with equal distribution. Pelvic ultrasound unremarkable-negative blood or discharge noted. Negative discomfort upon palpation to suprapubic and bilateral adnexal regions. CBC negative findings. Chem-8 negative findings. Wet prep negative for infection. GC Chlamydia probe pending. Pelvic ultrasound noted 2 fibroids within the uterus. Endometrium is heterogeneous, mild hypervascular and prominent-differentials consists of endometrial hyperplasia, polyp or carcinoma-sonohysterography recommended. Negative findings of ovarian torsion. Plain film of bilateral hip with pelvis  negative acute abnormalities noted. CT abdomen and pelvis with contrast negative acute abdominal abnormalities noted. Stable anterior sclerosis of the right femoral head with a small central lucency-possibly from avascular necrosis or degenerative arthropathy-the similar finding was noted on the CT abdomen and pelvis performed on 09/28/2012. Small hiatal hernia noted. Stable polycystic liver disease. Negative findings her ovarian torsion. Doubt PID. Doubt ectopic pregnancy. Negative ovarian masses noted. Negative findings of acute abdominal processes. Nonsurgical abdomen. 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 Pain controlled in ED setting. Discussed case and findings with attending physician who cleared patient for discharge. Patient stable, afebrile. Discharged patient. Referred patient to orthopedics regarding right hip pain that has been continuous-discussed with patient that she needs to followup with orthopedics regarding her degenerative changes and right hip/possible avascular necrosis in her right hip. Highly recommended MRI to be performed as outpatient. Referred patient to OB/GYN regarding uterine fibroids and thickening of the uterine wall-recommended that patient most likely need sonohysterography performed to rule out possible carcinoma. Discharged patient with potassium  due to low levels reported that they need to be re-checked within one week with PCP. Recommended Tylenol/Ibuprofen for pain control. Discussed with patient to rest and stay hydrated. Discussed with patient to continue to use ibuprofen as needed. Discussed with patient to closely monitor symptoms and if symptoms are to worsen or change to report back to the ED - strict return instructions given.  Patient agreed to plan of care, understood, all questions answered.   Jamse Mead, PA-C 03/20/13 1140

## 2013-03-19 NOTE — ED Notes (Signed)
Patient transported to CT 

## 2013-03-19 NOTE — Discharge Instructions (Signed)
Please call your doctor for a followup appointment within 24-48 hours. When you talk to your doctor please let them know that you were seen in the emergency department and have them acquire all of your records so that they can discuss the findings with you and formulate a treatment plan to fully care for your new and ongoing problems. Please call and set-up an appointment with Orthopedics regarding hip discomfort Please call and setup an appointment with women's outpatient clinic regarding uterine fibroids and thickening of the uterine wall-will need a sonohysterography to be performed Please take potassium as prescribed due to potassium being mildly low. Please have potassium re-checked within one week.  Please rest and stay hydrated Please continue to take Iburpofen 800 mg as needed for pain relief Please continue to monitor symptoms and if symptoms are to worsen or change (fever greater than 101, chills, neck pain, chest pain, shortness of breath, difficulty breathing, worsening abdominal pain, worsening right hip pain, vaginal bleeding, vaginal discharge, diarrhea, blood in stools, black tarry stools, weakness, numbness, tingling, fall, injury) please report back to emergency department immediately  Uterine Fibroid A uterine fibroid is a growth (tumor) that occurs in your uterus. This type of tumor is not cancerous and does not spread out of the uterus. You can have one or many fibroids. Fibroids can vary in size, weight, and where they grow in the uterus. Some can become quite large. Most fibroids do not require medical treatment, but some can cause pain or heavy bleeding during and between periods. CAUSES  A fibroid is the result of a single uterine cell that keeps growing (unregulated), which is different than most cells in the human body. Most cells have a control mechanism that keeps them from reproducing without control.  SIGNS AND SYMPTOMS   Bleeding.  Pelvic pain and pressure.  Bladder  problems due to the size of the fibroid.  Infertility and miscarriages depending on the size and location of the fibroid. DIAGNOSIS  Uterine fibroids are diagnosed through a physical exam. Your health care provider may feel the lumpy tumors during a pelvic exam. Ultrasonography may be done to get information regarding size, location, and number of tumors.  TREATMENT   Your health care provider may recommend watchful waiting. This involves getting the fibroid checked by your health care provider to see if it grows or shrinks.   Hormone treatment or an intrauterine device (IUD) may be prescribed.   Surgery may be needed to remove the fibroids (myomectomy) or the uterus (hysterectomy). This depends on your situation. When fibroids interfere with fertility and a woman wants to become pregnant, a health care provider may recommend having the fibroids removed.  Ward care depends on how you were treated. In general:   Keep all follow-up appointments with your health care provider.   Only take over-the-counter or prescription medicines as directed by your health care provider. If you were prescribed a hormone treatment, take the hormone medicines exactly as directed. Do not take aspirin. It can cause bleeding.   Talk to your health care provider about taking iron pills.  If your periods are troublesome but not so heavy, lie down with your feet raised slightly above your heart. Place cold packs on your lower abdomen.   If your periods are heavy, write down the number of pads or tampons you use per month. Bring this information to your health care provider.   Include green vegetables in your diet.  [place]  CARE IF:  You have pelvic pain or cramps not controlled with medicines.   You have a sudden increase in pelvic pain.   You have an increase in bleeding between and during periods.   You have excessive periods and soak tampons or pads in a  half hour or less.  You feel lightheaded or have fainting episodes. Document Released: 01/31/2000 Document Revised: 11/23/2012 Document Reviewed: 09/01/2012 [facility] Patient Information 2014 [place], [place].  Avascular Necrosis Avascular necrosis is a disease resulting from the temporary or permanent loss of the blood supply to the bones. Without blood, the bone tissue dies and causes the bone to become soft. If the process involves the bone near a joint, it may lead to collapse of the joint surface. This disease is also known as:  Osteonecrosis.  Aseptic necrosis.  Ischemic bone necrosis. Avascular necrosis most commonly affects the ends (epiphysis) of long bones. The femur, the bone extending from the knee joint to the hip joint, is the bone most commonly involved. The disease may affect 1 bone, more than 1 bone at the same time, more than 1 bone at different times. It affects men and women equally. Avascular necrosis occurs at any age. But it is more common between the ages of 81 and 28 years. SYMPTOMS  In early stages patients may not have any symptoms. But as the disease progresses, joint pain generally develops. At first there is pain when putting weight on the affected joint, and then when resting. Pain usually develops gradually. It may be mild or severe. As the disease progresses and the bone and surrounding joint surface collapses, pain may develop or increase dramatically. Pain may be severe enough to limit range of motion in the affected joint. The period of time between the first symptoms and loss of joint function is different for each patient. This can range from several months to more than a year. Disability depends on:  What part of the bone is affected.  How large an area is involved.  How effectively the bone repairs itself.  If other illnesses are present.  If you are being treated for cancer with medications (chemotherapy).  Radiation.  The cause of the avascular  necrosis. DIAGNOSIS  The diagnosis of aseptic necrosis is usually made by:  Taking a history.  Doing an exam.  Taking X-rays. (If X-rays are normal, an MRI may be required.)  Sometimes further blood work and specialized studies may be necessary. TREATMENT  Treatment for this disease is necessary to maintain joint function. If untreated, most patients will suffer severe pain and limitation in movement within 2 years. Several treatments are available that help prevent further bone and joint damage. They can also reduce pain. To determine the most appropriate treatment, the caregiver considers the following aspects of a patient's disease:  The age of the patient.  The stage of the disease (early or late).  The location and amount of bone affected. It may be a small or large area.  The underlying cause of avascular necrosis. The goals in treatment are to:  Improve the patient's use of the affected joint.  Stop further damage to the bone.  Improve bone and joint survival. Your caregiver may use one or more of the following treatments:  Reduced weight bearing. If avascular necrosis is diagnosed early, the caregiver may begin treatment by having the patient limit weight on the affected joint. The caregiver may recommend limiting activities or using crutches. In some cases, reduced weight bearing can slow  the damage caused by the disease and permit natural healing. When combined with medication to reduce pain, reduced weight bearing can be an effective way to avoid or delay surgery for some patients. Most patients eventually will need surgery to reconstruct the joint.  Core decompression. Core decompression works best in people who are in the earliest stages of avascular necrosis, before the collapse of the joint. This procedure often can reduce pain and slow the progression of bone and joint destruction in these patients. This surgical procedure removes the inner layer of bone,  which:  Reduces pressure within the bone.  Increases blood flow to the bone.  Allows more blood vessels to form.  Reduces pain.  Osteotomy. This surgical procedure re-shapes the bone to reduce stress on the affected area of the joint. There is a lengthy recovery period. The patient's activities are very limited for 3 to 12 months after an osteotomy. This procedure is most effective for younger patients with advanced avascular necrosis, and those with a large area of affected bone.  Bone Graft. A bone graft may be used to support a joint after core decompression. Bone grafting is surgery that transplants healthy bone from one part of the patient, such as the leg, to the diseased area. Sometimes the bone is taken with it's blood vessels which are attached to local blood vessels near the area of bone collapse. This is called a vascularized bone graft. There is a lengthy recovery period after a bone graft, usually from 6 to 12 months. This procedure is technically complex.  Arthroplasty. Arthroplasty is also known as total joint replacement. Total joint replacement is used in late-stage avascular necrosis, and when the joint is deformed. In this surgery, the diseased joint is replaced with artificial parts. It may be recommended for people who are not good candidates for other treatments, such as patients who may not do well with repeated attempts to preserve the joint. Various types of replacements are available, and patients should discuss specific needs with their caregiver. New treatments being tried include:  The use of medications.  Electrical stimulation.  Combination therapies to increase the growth of new bone and blood vessels. Document Released: 07/25/2001 Document Revised: 04/27/2011 Document Reviewed: 09/25/2008 [facility] Patient Information 2014 [place], [place].   Emergency Department Resource Guide 1) Find a Doctor and Pay Out of Pocket Although you won't have to find out who is  covered by your insurance plan, it is a good idea to ask around and get recommendations. You will then need to call the office and see if the doctor you have chosen will accept you as a new patient and what types of options they offer for patients who are self-pay. Some doctors offer discounts or will set up payment plans for their patients who do not have insurance, but you will need to ask so you aren't surprised when you get to your appointment.  2) Contact Your Local Health Department Not all health departments have doctors that can see patients for sick visits, but many do, so it is worth a call to see if yours does. If you don't know where your local health department is, you can check in your phone book. The CDC also has a tool to help you locate your state's health department, and many state websites also have listings of all of their local health departments.  3) Find a Walk-in Clinic If your illness is not likely to be very severe or complicated, you may want to try a  walk in clinic. These are popping up all over the country in pharmacies, drugstores, and shopping centers. They're usually staffed by nurse practitioners or physician assistants that have been trained to treat common illnesses and complaints. They're usually fairly quick and inexpensive. However, if you have serious medical issues or chronic medical problems, these are probably not your best option.  No Primary Care Doctor: - Call Health Connect at  [phone] - they can help you locate a primary care doctor that  accepts your insurance, provides certain services, etc. - Physician Referral Service- [phone]  Chronic Pain Problems: Organization         Address  Phone   Notes  [facility]  [phone] Patients need to be referred by their primary care doctor.   Medication Assistance: Organization         Address  Phone   Notes  [facility] Medication [facility] [address] [place], [place] [address] [phone]) [phone] --Must be a resident of [facility] [place] -- Must have NO insurance coverage whatsoever (no Medicaid/ Medicare, etc.) -- The pt. MUST have a primary care doctor that directs their care regularly and follows them in the community   MedAssist  [phone]) [phone]   [employer]  [phone]    Agencies that provide inexpensive medical care: Organization         Address  Phone   Notes  [facility]  [phone]) [phone]   [facility]    [phone]   [facility] [address] [place], [place] [address] [phone]) [phone]   [facility] [address], [place] [phone]) [phone]   Planned Parenthood    [phone]   [facility]    [phone]   [facility]  [address], [place] Phone:  [phone]) [phone], Fax:  [phone]) [phone] Hours of Operation:  9 am - 6 pm, M-F.  Also accepts Medicaid/Medicare and self-pay.  [facility]  [address], [place] Phone: [phone], Fax: [phone]) [phone]. Hours of Operation:  8:30 am - 5:30 pm, M-F.  Also accepts Medicaid and self-pay.  [facility] [place] [address], [place] Phone: [phone]) [phone]   [facility] [address], [place], [place] [phone], Ext. [phone] Mondays & Thursdays: 7-9 AM.  First 15 patients are seen on a first come, first serve basis.    Medicaid-accepting [facility] Providers:  Organization         Address  Phone   Notes  [facility] [address], [place] [phone]) [phone] Also accepts self-pay patients.  [facility] [address], [place]  [phone]) [phone]   [facility] [address], [place] [phone]   [facility] [address] [place] [address], [place] [phone]   Renaye RakersVeita Bland [address], [place]   [phone] Only accepts [place] Access [place] patients after they have their name applied to their card.   Self-Pay (no insurance) in [facility] [place]:  Organization         Address  Phone   Notes  Sickle Cell Patients, [facility] [address], [place] [phone]   [facility] [address], [place] [phone]) [phone]   [facility] [place]  [address],  [phone]   [facility]/Dr. Osei-Bonsu  [address], [place] or [address], [place] [phone] Phone number for both [place] and [place] locations is the same.  [facility]gent [facility] and [facility] [address] [place] [phone]   [facility] [address], [place] or [address] [phone]   [facility] [address] [place] [phone], phone; [phone], fax Sees patients 1st and 3rd Saturday of every month.  Must not qualify for public or private insurance (i.e. Medicaid, Medicare, [place] Health Choice, Veterans' Benefits)  Household income should be no more than 200% of the poverty level The clinic cannot treat you if you are pregnant or think you are pregnant  Sexually transmitted diseases are not treated at the clinic.    Dental Care: Organization         Address  Phone  Notes  [facility] of [facility] [address] [place], [place] [phone] Accepts children up to age 86 who are enrolled in [place] or [place] Health Choice; pregnant women with a Medicaid card; and children who have applied for Medicaid or [place] Health Choice, but were declined, whose parents can pay a reduced fee at time of service.  [facility] Department of [facility]  [address], [place] [phone] Accepts children up to age 35 who are enrolled  in [place] or [place] Health Choice; pregnant women with a Medicaid card; and children who have applied for Medicaid or  Health Choice, but were declined, whose parents can pay a reduced fee at time of service.  [facility]  [address] [place], [place] [phone] Patients are seen by appointment only. Walk-ins are not accepted. [facility] will see patients 72 years of age and older. Monday - Tuesday (8am-5pm) Most Wednesdays (8:30-5pm) $30 per visit, cash only  [facility] Adult [facility] Access PROGRAM  [address], [facility] [phone] Patients are seen by appointment only. Walk-ins are not accepted. [facility] will see patients 61 years of age and older. One Wednesday Evening (Monthly: Volunteer Based).  $30 per visit, cash only  [employer]  [phone] for adults; Children under age 31, call Graduate Pediatric Dentistry at [phone]. Children aged 46-14, please call [phone] to request a pediatric application.  Dental services are provided in all areas of dental care including fillings, crowns and bridges, complete and partial dentures, implants, gum treatment, root canals, and extractions. Preventive care is also provided. Treatment is provided to both adults and children. Patients are selected via a lottery and there is often a waiting list.   [facility] [address] [place]  [phone] [email or web address]   [facility] [address] [place], [place] [phone], Ext. [phone] Second and Fourth Thursday of each month, opens at 6:30 AM; Clinic ends at 9 AM.  Patients are seen on a first-come first-served basis, and a limited number are seen during each clinic.   [facility]  [address] [place], [place] [phone]   Eligibility Requirements You must have lived in [place], [place], or [place] counties for at least the last three months.   You cannot be  eligible for state or federal sponsored healthcare  insurance, including CIGNAVeterans Administration, [place]icaid, or [employer]Medicare.   You generally cannot be eligible for healthcare insurance through your employer.    How to apply: Eligibility screenings are held every Tuesday and Wednesday afternoon from 1:00 pm until 4:00 pm. You do not need an appointment for the interview!  [facility] [address], [place], [place] [phone]   [facility]  [phone]   [facility]  [phone]   [facility]  [phone]    Behavioral Health Resources in the Community: Intensive Outpatient Programs Organization         Address  Phone  Notes  [facility] [address], [place], [place] [phone]   [facility] [address], [place], [place] [phone]   [facility] [address], [place], [place]  [phone]   [facility] [address],  [place], [place] [phone] or [phone]   Substance Abuse Resources Organization         Address  Phone  Notes  [facility] Drug [facility]  [phone]   [facility]  [phone]   [facility]  [phone]   [facility]  [phone]   Residential & Outpatient Substance Abuse Program  [phone]   Psychological Services Organization         Address  Phone  Notes  [facility]  [phone]- [phone]   [facility]  [phone]- [phone]   [facility] [address], [place] [phone] or [phone]    Mobile Crisis Teams Organization         Address  Phone  Notes  [facility], Mobile Crisis Care Unit  [phone]   [facility]  [address] [place], [place] [phone]   Doristine LocksSharon DeEsch [address] [place] [phone]    Self-Help/Support Groups Organization          Address  Phone             Notes  [facility] [place] - variety of support groups  [phone] Call for more information  Narcotics Anonymous (NA), [facility]aring [facility] [address], [place]  2 meetings at this location   Statisticianesidential Treatment Programs Organization         Address  Phone  Notes  [facility] [address],    [place]  [phone]   [facility]  [address], [place] [phone], [place], [place] [phone]   [facility] [address], [place] [phone] Admissions: 8am-3pm M-F  [facility] [address]    [place], [place] [phone]   [facility] [address], [place], [place] [phone]   [facility] [address]  [place], [place] [phone]   [facility] - Intensive Outpatient [address], [place], [place] [phone]   [facility] ([facility] [address]  [place], [place] [phone] or [phone]   [facility] ([facility]) [address] [place], [place] [phone] Accepts Medicaid  [facility] [address]  [place] [phone] Substance Abuse/Addiction Treatment   [facility] Behavioral Health Resources Organization         Address  Phone  Notes  [facility]  [phone]   Angie FavaJulie Brannon, PhD [address] [place], [place]   [phone] or [phone]   [facility]   [address] [place], [place] [phone]   [facility] [address] [place], [place] [phone] Insurance/Medicaid/sponsorship through [facility] [address]                                    [place], [place] [phone] Therapy/tele-psych/case  [facility] [address]   [place], [place] [phone]    Dr. Lolly Mustache  [phone]   [facility]  [employer]  [facility] Dept. 1) [address], [place] 2) [address], [place] 3)  [address], [place] [phone]  [phone]   [facility] Child Abuse Hotline [phone] or [phone] (After Hours)

## 2013-03-19 NOTE — ED Notes (Signed)
Patient transported to X-ray 

## 2013-03-20 LAB — GC/CHLAMYDIA PROBE AMP
CT PROBE, AMP APTIMA: NEGATIVE
GC Probe RNA: NEGATIVE

## 2013-03-20 LAB — POCT PREGNANCY, URINE: Preg Test, Ur: NEGATIVE

## 2013-03-20 NOTE — ED Provider Notes (Signed)
Medical screening examination/treatment/procedure(s) were performed by non-physician practitioner and as supervising physician I was immediately available for consultation/collaboration.  EKG Interpretation   None         Ephraim Hamburger, MD 03/20/13 [phone]

## 2013-03-23 ENCOUNTER — Other Ambulatory Visit (HOSPITAL_COMMUNITY): Payer: Self-pay | Admitting: Orthopaedic Surgery

## 2013-03-23 ENCOUNTER — Other Ambulatory Visit: Payer: Self-pay | Admitting: Orthopaedic Surgery

## 2013-03-23 DIAGNOSIS — M25551 Pain in right hip: Secondary | ICD-10-CM

## 2013-03-30 ENCOUNTER — Ambulatory Visit
Admission: RE | Admit: 2013-03-30 | Discharge: 2013-03-30 | Disposition: A | Discharge status: Home / Self Care | Payer: 59 | Source: Ambulatory Visit | Attending: Orthopaedic Surgery | Admitting: Orthopaedic Surgery

## 2013-03-30 DIAGNOSIS — M25551 Pain in right hip: Secondary | ICD-10-CM

## 2013-04-03 ENCOUNTER — Ambulatory Visit (HOSPITAL_COMMUNITY): Payer: 59

## 2013-04-24 ENCOUNTER — Ambulatory Visit (INDEPENDENT_AMBULATORY_CARE_PROVIDER_SITE_OTHER): Payer: 59 | Admitting: Family Medicine

## 2013-04-24 ENCOUNTER — Encounter: Payer: Self-pay | Admitting: Family Medicine

## 2013-04-24 VITALS — BP 110/74 | HR 76 | Ht 69.0 in | Wt 235.0 lb

## 2013-04-24 DIAGNOSIS — E669 Obesity, unspecified: Secondary | ICD-10-CM

## 2013-04-24 DIAGNOSIS — J309 Allergic rhinitis, unspecified: Secondary | ICD-10-CM

## 2013-04-24 DIAGNOSIS — M25559 Pain in unspecified hip: Secondary | ICD-10-CM

## 2013-04-24 DIAGNOSIS — E782 Mixed hyperlipidemia: Secondary | ICD-10-CM

## 2013-04-24 DIAGNOSIS — Z23 Encounter for immunization: Secondary | ICD-10-CM

## 2013-04-24 DIAGNOSIS — I10 Essential (primary) hypertension: Secondary | ICD-10-CM

## 2013-04-24 DIAGNOSIS — J45909 Unspecified asthma, uncomplicated: Secondary | ICD-10-CM

## 2013-04-24 DIAGNOSIS — Z0000 Encounter for general adult medical examination without abnormal findings: Secondary | ICD-10-CM

## 2013-04-24 LAB — POCT URINALYSIS DIPSTICK
Bilirubin, UA: NEGATIVE
GLUCOSE UA: NEGATIVE
Ketones, UA: NEGATIVE
Leukocytes, UA: NEGATIVE
NITRITE UA: NEGATIVE
PH UA: 6
Protein, UA: NEGATIVE
Spec Grav, UA: 1.01
Urobilinogen, UA: NEGATIVE

## 2013-04-24 NOTE — Patient Instructions (Signed)

## 2013-04-24 NOTE — Progress Notes (Signed)
Chief Complaint  Patient presents with  . Annual Exam    non fasting annual exam, no pap-sees Dr.Lowe and is UTD. UA showed trace blood, patient is asymptomatic. Did not do eye exam as she just saw Dr.Koop last month.  Would like to discuss her hip pain with you-has seen Dr.Xu at [facility] [place] and he wants her to go see a specialist at [place] is a 43 y.o. female who presents for a complete physical.  She has the following concerns:  Hyperlipidemia:  Has never been on medications, and doesn't want to take any.   Labs were reviewed (done prior to appt for CPE in December, which was rescheduled to today). Lab Results  Component Value Date   CHOL 208* 02/03/2013   HDL 42 02/03/2013   LDLCALC 149* 02/03/2013   TRIG 87 02/03/2013   CHOLHDL 5.0 02/03/2013   She uses whole milk in her coffee. Doesn't eat much cheese.  Doesn't eat red meat, only if she eats out, infrequently.  Only eats chicken.  Doesn't eat eggs (except in baked goods).  Occasional mac and cheese.  Uses [place] dressing on her salads.  Non-creamy soups (or with coconut milk).  Occasional ice cream. Not getting any exercise due to her hip pain.  Hypertension follow-up:  Blood pressures elsewhere are 106-120's/60-74.  Denies dizziness, headaches, chest pain.  Denies side effects of medications.  Allergies and asthma:  Doing well on her current regimen of Singulair.  She needed albuterol when sick in December, otherwise does not need rescue medication.  Ongoing problems with pain in her right hip, worse after being on her feet at work for 3 days in a row. Pt reports that the surgeon recommends surgery, but also wanted to try cortisone shot first. She is hesitant about doing something temporary like cortisone rather than surgery.  She states that the ortho referred her to someone at [facility] (that is more familiar with the surgery?), but she is hesitant to go there, as it will cost her more out of pocket,  being out of network.  She is having hysterectomy 4/27, due to severe pain related to her menstrual cycle, and recurrent fibroids.  Immunization History  Administered Date(s) Administered  . Influenza Split 10/15/2010, 11/19/2011  . Influenza Whole 11/12/2012  last tetanus was through [facility], and she thinks it was over 10 years ago Last Pap smear: per GYN, UTD Last mammogram: July 2014 Last colonoscopy: never Last DEXA: never Dentist: twice yearly Ophtho:  Yearly Exercise:  Minimal now due to hip pain  Past Medical History  Diagnosis Date  . Allergic rhinitis, cause unspecified   . Asthma with allergic rhinitis   . Essential hypertension, benign   . Mixed hyperlipidemia   . Unspecified vitamin D deficiency   . Positive PPD     h/o BCG vaccine    Past Surgical History  Procedure Laterality Date  . Tubal ligation    . Endometrial ablation  2007    Dr. Corinna Capra    History   Social History  . Marital Status: Divorced    Spouse Name: N/A    Number of Children: 2  . Years of Education: N/A   Occupational History  . Nurse [employer]   Social History Main Topics  . Smoking status: Never Smoker   . Smokeless tobacco: Never Used  . Alcohol Use: Yes     Comment: occasional (once a month)  . Drug Use: No  .  Sexual Activity: Not Currently   Other Topics Concern  . Not on file   Social History Narrative   Lives with her son; no pets. Divorced from her husband.  Daughter is in the army, living in [place]    Family History  Problem Relation Age of Onset  . Diabetes Mother   . Hypertension Mother   . Allergies Mother   . Heart disease Father 26    MI  . Hyperlipidemia Father   . Hypertension Father   . Cancer Paternal Grandmother     breast cancer in 66's   Outpatient Encounter Prescriptions as of 04/24/2013  Medication Sig Note  . cholecalciferol (VITAMIN D) 1000 UNITS tablet Take 1,000 Units by mouth daily.     . fexofenadine (ALLEGRA) 180 MG tablet Take 180 mg  by mouth at bedtime.    . fish oil-omega-3 fatty acids 1000 MG capsule Take 3 g by mouth daily.    Marland Kitchen HYDROcodone-acetaminophen (NORCO/VICODIN) 5-325 MG per tablet Take 1 tablet by mouth every 6 (six) hours as needed for moderate pain. 04/24/2013: From Dr. Autumn Patty using also for hip pain.  Uses it for about 3 days every 2 weeks (after working 3 days in a row)  . lisinopril-hydrochlorothiazide (PRINZIDE,ZESTORETIC) 20-12.5 MG per tablet Take 1 tablet by mouth daily.   . montelukast (SINGULAIR) 10 MG tablet Take 1 tablet (10 mg total) by mouth at bedtime.   . Multiple Vitamins-Minerals (MULTIVITAMIN WITH MINERALS) tablet Take 1 tablet by mouth daily.     . [DISCONTINUED] potassium chloride SA (K-DUR,KLOR-CON) 20 MEQ tablet Take 1 tablet (20 mEq total) by mouth daily.   Marland Kitchen albuterol (PROVENTIL HFA;VENTOLIN HFA) 108 (90 BASE) MCG/ACT inhaler Inhale 2 puffs into the lungs every 6 (six) hours as needed for wheezing. 04/24/2013: Last needed when sick in December 2014    Allergies  Allergen Reactions  . Penicillins Hives  . Percocet [Oxycodone-Acetaminophen] Itching   ROS: The patient denies anorexia, fever, headaches,  vision changes, decreased hearing, ear pain, sore throat, breast concerns, chest pain, palpitations, dizziness, syncope, dyspnea on exertion, cough, swelling, nausea, vomiting, diarrhea, constipation, abdominal pain, melena, hematochezia, indigestion/heartburn, hematuria, incontinence, dysuria, vaginal discharge, odor or itch, genital lesions, numbness, tingling, weakness, tremor, suspicious skin lesions, depression, anxiety, abnormal bleeding/bruising, or enlarged lymph nodes.  Gained 10 pounds in the last 4-5 months, but same weight as she was 1 year ago. +painful periods, only minimal spotting (due to h/o ablation) +right hip pain No back pain   PHYSICAL EXAM: BP 110/74  Pulse 76  Ht 5' 9"  (1.753 m)  Wt 235 lb (106.595 kg)  BMI 34.69 kg/m2  General Appearance:    Alert,  cooperative, no distress, appears stated age  Head:    Normocephalic, without obvious abnormality, atraumatic  Eyes:    PERRL, conjunctiva/corneas clear, EOM's intact, fundi    benign  Ears:    Normal TM's and external ear canals  Nose:   Nares normal, mucosa normal, no drainage or sinus   tenderness  Throat:   Lips, mucosa, and tongue normal; teeth and gums normal  Neck:   Supple, no lymphadenopathy;  thyroid:  no   enlargement/tenderness/nodules; no carotid   bruit or JVD  Back:    Spine nontender, no curvature, ROM normal, no CVA     tenderness  Lungs:     Clear to auscultation bilaterally without wheezes, rales or     ronchi; respirations unlabored  Chest Wall:    No tenderness or deformity  Heart:    Regular rate and rhythm, S1 and S2 normal, no murmur, rub   or gallop  Breast Exam:    Deferred to GYN  Abdomen:     Soft, non-tender, nondistended, normoactive bowel sounds,    no masses, no hepatosplenomegaly  Genitalia:    Deferred to GYN     Extremities:   No clubbing, cyanosis or edema  Pulses:   2+ and symmetric all extremities  Skin:   Skin color, texture, turgor normal, no rashes or lesions  Lymph nodes:   Cervical, supraclavicular, and axillary nodes normal  Neurologic:   CNII-XII intact, normal strength, sensation and gait; reflexes 2+ and symmetric throughout          Psych:   Normal mood, affect, hygiene and grooming.     Lab Results  Component Value Date   WBC 8.6 03/19/2013   HGB 16.0* 03/19/2013   HCT 47.0* 03/19/2013   MCV 88.2 03/19/2013   PLT 181 03/19/2013  (done at ER visit) CBC done with her labs 01/2013 showed WBC 3.5, Hg 12.7  Lab Results  Component Value Date   CHOL 208* 02/03/2013   CHOL 216* 09/22/2011   CHOL 231* 12/01/2010   Lab Results  Component Value Date   HDL 42 02/03/2013   HDL 39* 09/22/2011   HDL 41 12/01/2010   Lab Results  Component Value Date   LDLCALC 149* 02/03/2013   LDLCALC 137* 09/22/2011   LDLCALC 159* 12/01/2010   Lab Results   Component Value Date   TRIG 87 02/03/2013   TRIG 199* 09/22/2011   TRIG 156* 12/01/2010   Lab Results  Component Value Date   CHOLHDL 5.0 02/03/2013   CHOLHDL 5.5 09/22/2011   CHOLHDL 5.6 12/01/2010   No results found for this basename: LDLDIRECT    Vitamin D-OH 61  Lab Results  Component Value Date   TSH 0.763 02/03/2013     Chemistry      Component Value Date/Time   NA 140 03/19/2013 1231   K 3.3* 03/19/2013 1231   CL 101 03/19/2013 1231   CO2 23 02/03/2013 0848   BUN 13 03/19/2013 1231   CREATININE 1.10 03/19/2013 1231   CREATININE 0.97 02/03/2013 0848      Component Value Date/Time   CALCIUM 9.1 02/03/2013 0848   ALKPHOS 47 02/03/2013 0848   AST 19 02/03/2013 0848   ALT 13 02/03/2013 0848   BILITOT 0.5 02/03/2013 0848     Electrolytes above were from ER visit. c-met done 01/2013 was normal--with K 3.8 and glucose 80  MRI of pelvis 03/2013 IMPRESSION:  1. Small subcortical T2 hyperintense focus along the anterior  superior right femoral head with surrounding bony sclerosis. The  lack of sharp definition and overall features favor a degenerative  subcortical osteochondral lesion over avascular necrosis.  2. Small right ovarian cyst.  3. Uterine fibroids.  4. Moderate left and mild right hamstring tendinopathy.   Plain films of hip was normal.  ASSESSMENT/PLAN:  Routine general medical examination at a health care facility - Plan: POCT Urinalysis Dipstick  Mixed hyperlipidemia - meds recommended given her good diet, and persistently high LDL, chol/HDL ratio.  She declines.  plan to check [facility] in future  Essential hypertension, benign - well controlled  Asthma with allergic rhinitis - controlled on singulair  Allergic rhinitis, cause unspecified - controlled on singulair.  plan daily antihistamines if/when spring allergies worsen  Need for Tdap vaccination - Plan: Tdap vaccine greater than or  equal to 7yo IM  Need for prophylactic vaccination against  Streptococcus pneumoniae (pneumococcus) - Plan: Pneumococcal polysaccharide vaccine 23-valent greater than or equal to 2yo subcutaneous/IM  Obesity (BMI 30-39.9)  Discussed monthly self breast exams and yearly mammograms after the age of 81; at least 30 minutes of aerobic activity at least 5 days/week; proper sunscreen use reviewed; healthy diet, including goals of calcium and vitamin D intake and alcohol recommendations (less than or equal to 1 drink/day) reviewed; regular seatbelt use; changing batteries in smoke detectors.  Immunization recommendations discussed--Tdap and pneumovax given today.  Colonoscopy recommendations reviewed, age 55  Hyperlipidemia: Statin was strongly recommended, as she doesn't have any significant improvements to make in her diet. Wants to hold off at this point, and deal with her hip pain and hysterectomy first  [facility] at her convenience--will call when ready to have this done.  F/u 6 months, sooner prn

## 2013-04-25 DIAGNOSIS — E669 Obesity, unspecified: Secondary | ICD-10-CM | POA: Insufficient documentation

## 2013-05-29 ENCOUNTER — Encounter (HOSPITAL_COMMUNITY): Payer: Self-pay | Admitting: Pharmacist

## 2013-06-06 NOTE — Patient Instructions (Addendum)
   Your procedure is scheduled on:  Monday, April 27  Enter through the [employer] of [facility] at: 6 AM Pick up the phone at the desk and dial [phone] and inform us of your arrival.  Please call this number if you have any problems the morning of surgery: [phone]  Remember: Do not eat or drink after midnight: Sunday Take these medicines the morning of surgery with a SIP OF WATER: Bring albuterol inhaler with you on day of surgery.  Do not wear jewelry, make-up, or FINGER nail polish No metal in your hair or on your body. Do not wear lotions, powders, perfumes.  You may wear deodorant.  Do not bring valuables to the hospital. Contacts, dentures or bridgework may not be worn into surgery.  Leave suitcase in the car. After Surgery it may be brought to your room. For patients being admitted to the hospital, checkout time is 11:00am the day of discharge.  Home with sister Posey Boyer cell [phone].

## 2013-06-06 NOTE — H&P (Addendum)
  Candace Walker presents today for GYN evaluation of pelvic pain, fibroids.  She was seen actually last night at [facility].  This is her second trip over there.  She has intermittent pelvic pain which is incapacitating.  In the past, she has had a NovaSure ablation.  She has done great from a bleeding standpoint, however, subsequently has developed two fibroids.  Last night they did another ultrasound but also a CT scan showing 2.4 x 2.1 cm right fundal fibroid and a 2.7 x 2.3 cm left fundal fibroid.  Endometrium is heterogenous with question of a 10 mm area.  Normal-appearing ovaries.  Laboratory evaluation is normal.  CT scan otherwise is essentially normal.  The pain today is actually improved.  She has taken pain medication for this.   O: Physical exam:  Abdomen is soft, nontender, nondistended.  Pelvic exam:  The uterus is anteverted, mobile, 6-8 week size, nontender.  No adnexal masses palpable.   A&P: Fibroids, pelvic pain, intermittent stabbing without bleeding.  She has had an ablation.  Fibroids have changed with time.  They have nearly doubled in size.  Because of the intermittent pelvic pain which is incapacitating for her, she does want to proceed with further treatment options.  Discussed the possibility of something such as Depo-Provera.  She wants to proceed with hysterectomy due to the pain.  She will be an excellent candidate for laparoscopically assisted vaginal hysterectomy.  We will schedule this in the near future with preop evaluation most likely needing to be a [facility].  Did discuss preservation of the ovaries. The patient did get Rocephin with her last surgery without problems. DL  06/12/13 0715 This patient has been seen and examined.   All of her questions were answered.  Labs and vital signs reviewed.  Informed consent has been obtained.  The History and Physical is current. DL

## 2013-06-07 ENCOUNTER — Encounter (HOSPITAL_COMMUNITY)
Admission: RE | Admit: 2013-06-07 | Discharge: 2013-06-07 | Disposition: A | Discharge status: Home / Self Care | Payer: 59 | Source: Ambulatory Visit | Attending: Obstetrics and Gynecology | Admitting: Obstetrics and Gynecology

## 2013-06-07 ENCOUNTER — Encounter (HOSPITAL_COMMUNITY): Payer: Self-pay

## 2013-06-07 DIAGNOSIS — Z01812 Encounter for preprocedural laboratory examination: Secondary | ICD-10-CM | POA: Insufficient documentation

## 2013-06-07 DIAGNOSIS — Z01810 Encounter for preprocedural cardiovascular examination: Secondary | ICD-10-CM | POA: Insufficient documentation

## 2013-06-07 HISTORY — DX: Unspecified osteoarthritis, unspecified site: M19.90

## 2013-06-07 LAB — CBC
HEMATOCRIT: 36.8 % (ref 36.0–46.0)
Hemoglobin: 12.8 g/dL (ref 12.0–15.0)
MCH: 30.3 pg (ref 26.0–34.0)
MCHC: 34.8 g/dL (ref 30.0–36.0)
MCV: 87 fL (ref 78.0–100.0)
PLATELETS: 150 10*3/uL (ref 150–400)
RBC: 4.23 MIL/uL (ref 3.87–5.11)
RDW: 12.8 % (ref 11.5–15.5)
WBC: 5.1 10*3/uL (ref 4.0–10.5)

## 2013-06-07 LAB — BASIC METABOLIC PANEL
BUN: 17 mg/dL (ref 6–23)
CO2: 25 mEq/L (ref 19–32)
CREATININE: 1.07 mg/dL (ref 0.50–1.10)
Calcium: 9.3 mg/dL (ref 8.4–10.5)
Chloride: 100 mEq/L (ref 96–112)
GFR, EST AFRICAN AMERICAN: 73 mL/min — AB (ref >90)
GFR, EST NON AFRICAN AMERICAN: 63 mL/min — AB (ref >90)
Glucose, Bld: 85 mg/dL (ref 70–99)
Potassium: 3.7 mEq/L (ref 3.7–5.3)
Sodium: 138 mEq/L (ref 137–147)

## 2013-06-11 NOTE — Anesthesia Preprocedure Evaluation (Addendum)
Anesthesia Evaluation  Patient identified by MRN, date of birth, ID band Patient awake    Reviewed: Allergy & Precautions, H&P , NPO status , Patient's Chart, lab work & pertinent test results  Airway Mallampati: II TM Distance: >3 FB Neck ROM: Full    Dental  (+) Dental Advisory Given   Pulmonary asthma ,          Cardiovascular hypertension, Rhythm:Regular Rate:Normal     Neuro/Psych negative neurological ROS  negative psych ROS   GI/Hepatic negative GI ROS, Neg liver ROS,   Endo/Other  negative endocrine ROS  Renal/GU negative Renal ROS     Musculoskeletal negative musculoskeletal ROS (+)   Abdominal (+) + obese,   Peds  Hematology negative hematology ROS (+)   Anesthesia Other Findings   Reproductive/Obstetrics negative OB ROS                          Anesthesia Physical Anesthesia Plan  ASA: II  Anesthesia Plan: General   Post-op Pain Management:    Induction: Intravenous  Airway Management Planned: Oral ETT  Additional Equipment:   Intra-op Plan:   Post-operative Plan: Extubation in OR  Informed Consent: I have reviewed the patients History and Physical, chart, labs and discussed the procedure including the risks, benefits and alternatives for the proposed anesthesia with the patient or authorized representative who has indicated his/her understanding and acceptance.   Dental advisory given  Plan Discussed with: CRNA  Anesthesia Plan Comments:         Anesthesia Quick Evaluation

## 2013-06-12 ENCOUNTER — Ambulatory Visit (HOSPITAL_COMMUNITY): Payer: 59 | Admitting: Anesthesiology

## 2013-06-12 ENCOUNTER — Encounter (HOSPITAL_COMMUNITY): Payer: Self-pay

## 2013-06-12 ENCOUNTER — Observation Stay (HOSPITAL_COMMUNITY)
Admission: RE | Admit: 2013-06-12 | Discharge: 2013-06-13 | Disposition: A | Discharge status: Home / Self Care | Payer: 59 | Source: Ambulatory Visit | Attending: Obstetrics and Gynecology | Admitting: Obstetrics and Gynecology

## 2013-06-12 ENCOUNTER — Encounter (HOSPITAL_COMMUNITY)
Admission: RE | Disposition: A | Discharge status: Home / Self Care | Payer: Self-pay | Source: Ambulatory Visit | Attending: Obstetrics and Gynecology

## 2013-06-12 ENCOUNTER — Encounter (HOSPITAL_COMMUNITY): Payer: 59 | Admitting: Anesthesiology

## 2013-06-12 DIAGNOSIS — N8000 Endometriosis of the uterus, unspecified: Secondary | ICD-10-CM | POA: Insufficient documentation

## 2013-06-12 DIAGNOSIS — D259 Leiomyoma of uterus, unspecified: Secondary | ICD-10-CM | POA: Insufficient documentation

## 2013-06-12 DIAGNOSIS — J45909 Unspecified asthma, uncomplicated: Secondary | ICD-10-CM | POA: Insufficient documentation

## 2013-06-12 DIAGNOSIS — N949 Unspecified condition associated with female genital organs and menstrual cycle: Principal | ICD-10-CM | POA: Insufficient documentation

## 2013-06-12 DIAGNOSIS — Z90710 Acquired absence of both cervix and uterus: Secondary | ICD-10-CM | POA: Diagnosis present

## 2013-06-12 DIAGNOSIS — I10 Essential (primary) hypertension: Secondary | ICD-10-CM | POA: Insufficient documentation

## 2013-06-12 DIAGNOSIS — N838 Other noninflammatory disorders of ovary, fallopian tube and broad ligament: Secondary | ICD-10-CM | POA: Insufficient documentation

## 2013-06-12 HISTORY — PX: LAPAROSCOPIC ASSISTED VAGINAL HYSTERECTOMY: SHX5398

## 2013-06-12 HISTORY — PX: LAPAROSCOPIC BILATERAL SALPINGECTOMY: SHX5889

## 2013-06-12 SURGERY — HYSTERECTOMY, VAGINAL, LAPAROSCOPY-ASSISTED
Anesthesia: General | Site: Abdomen

## 2013-06-12 MED ORDER — MONTELUKAST SODIUM 10 MG PO TABS
10.0000 mg | ORAL_TABLET | Freq: Every day | ORAL | Status: DC
  Filled 2013-06-12: qty 1

## 2013-06-12 MED ORDER — HYDROCHLOROTHIAZIDE 12.5 MG PO CAPS
12.5000 mg | ORAL_CAPSULE | Freq: Every day | ORAL | Status: DC
  Filled 2013-06-12 (×2): qty 1

## 2013-06-12 MED ORDER — FENTANYL CITRATE 0.05 MG/ML IJ SOLN
INTRAMUSCULAR | Status: DC | PRN
  Administered 2013-06-12 (×4): 50 ug via INTRAVENOUS

## 2013-06-12 MED ORDER — DEXTROSE 5 % IV SOLN
2.0000 g | INTRAVENOUS | Status: AC
  Administered 2013-06-12: 2 g via INTRAVENOUS
  Filled 2013-06-12: qty 2

## 2013-06-12 MED ORDER — ROCURONIUM BROMIDE 100 MG/10ML IV SOLN
INTRAVENOUS | Status: AC
  Filled 2013-06-12: qty 1

## 2013-06-12 MED ORDER — LIDOCAINE HCL (CARDIAC) 20 MG/ML IV SOLN
INTRAVENOUS | Status: DC | PRN
  Administered 2013-06-12: 30 mg via INTRAVENOUS

## 2013-06-12 MED ORDER — FENTANYL CITRATE 0.05 MG/ML IJ SOLN
25.0000 ug | INTRAMUSCULAR | Status: DC | PRN
  Administered 2013-06-12 (×2): 50 ug via INTRAVENOUS

## 2013-06-12 MED ORDER — SODIUM CHLORIDE 0.9 % IJ SOLN: 9.0000 mL | INTRAMUSCULAR | Status: DC | PRN

## 2013-06-12 MED ORDER — DEXAMETHASONE SODIUM PHOSPHATE 10 MG/ML IJ SOLN
INTRAMUSCULAR | Status: AC
  Filled 2013-06-12: qty 1

## 2013-06-12 MED ORDER — KETOROLAC TROMETHAMINE 30 MG/ML IJ SOLN
INTRAMUSCULAR | Status: AC
  Filled 2013-06-12: qty 1

## 2013-06-12 MED ORDER — ONDANSETRON HCL 4 MG/2ML IJ SOLN
INTRAMUSCULAR | Status: DC | PRN
Start: 2013-06-12 — End: 2013-06-12
  Administered 2013-06-12: 4 mg via INTRAVENOUS

## 2013-06-12 MED ORDER — DEXAMETHASONE SODIUM PHOSPHATE 10 MG/ML IJ SOLN
INTRAMUSCULAR | Status: DC | PRN
  Administered 2013-06-12: 10 mg via INTRAVENOUS

## 2013-06-12 MED ORDER — HYDROCODONE-ACETAMINOPHEN 5-325 MG PO TABS
1.0000 | ORAL_TABLET | Freq: Four times a day (QID) | ORAL | Status: DC | PRN
  Administered 2013-06-12 – 2013-06-13 (×3): 1 via ORAL
  Filled 2013-06-12 (×3): qty 1
  Filled 2013-06-12: qty 2

## 2013-06-12 MED ORDER — HYDROMORPHONE HCL PF 1 MG/ML IJ SOLN
INTRAMUSCULAR | Status: DC | PRN
Start: 2013-06-12 — End: 2013-06-12
  Administered 2013-06-12: 1 mg via INTRAVENOUS

## 2013-06-12 MED ORDER — LISINOPRIL-HYDROCHLOROTHIAZIDE 20-12.5 MG PO TABS: 1.0000 | ORAL_TABLET | Freq: Every day | ORAL | Status: DC

## 2013-06-12 MED ORDER — GLYCOPYRROLATE 0.2 MG/ML IJ SOLN
INTRAMUSCULAR | Status: DC | PRN
  Administered 2013-06-12: .5 mg via INTRAVENOUS

## 2013-06-12 MED ORDER — HYDROMORPHONE HCL PF 1 MG/ML IJ SOLN
0.2500 mg | INTRAMUSCULAR | Status: DC | PRN
  Administered 2013-06-12 (×4): 0.5 mg via INTRAVENOUS

## 2013-06-12 MED ORDER — ROCURONIUM BROMIDE 100 MG/10ML IV SOLN
INTRAVENOUS | Status: DC | PRN
  Administered 2013-06-12: 40 mg via INTRAVENOUS

## 2013-06-12 MED ORDER — BUPIVACAINE HCL (PF) 0.25 % IJ SOLN
INTRAMUSCULAR | Status: DC | PRN
Start: 2013-06-12 — End: 2013-06-12
  Administered 2013-06-12 (×2): 30 mL

## 2013-06-12 MED ORDER — ALBUTEROL SULFATE (2.5 MG/3ML) 0.083% IN NEBU: 2.5000 mg | INHALATION_SOLUTION | Freq: Four times a day (QID) | RESPIRATORY_TRACT | Status: DC | PRN

## 2013-06-12 MED ORDER — PROPOFOL 10 MG/ML IV EMUL
INTRAVENOUS | Status: AC
  Filled 2013-06-12: qty 20

## 2013-06-12 MED ORDER — DEXTROSE-NACL 5-0.45 % IV SOLN
INTRAVENOUS | Status: DC
Start: 2013-06-12 — End: 2013-06-13
  Administered 2013-06-12 (×2): via INTRAVENOUS

## 2013-06-12 MED ORDER — OXYCODONE-ACETAMINOPHEN 5-325 MG PO TABS: 1.0000 | ORAL_TABLET | ORAL | Status: DC | PRN

## 2013-06-12 MED ORDER — ALBUTEROL SULFATE HFA 108 (90 BASE) MCG/ACT IN AERS
INHALATION_SPRAY | RESPIRATORY_TRACT | Status: DC | PRN
  Administered 2013-06-12: 2 via RESPIRATORY_TRACT

## 2013-06-12 MED ORDER — NALOXONE HCL 0.4 MG/ML IJ SOLN: 0.4000 mg | INTRAMUSCULAR | Status: DC | PRN

## 2013-06-12 MED ORDER — GLYCOPYRROLATE 0.2 MG/ML IJ SOLN
INTRAMUSCULAR | Status: AC
  Filled 2013-06-12: qty 3

## 2013-06-12 MED ORDER — METHOCARBAMOL 500 MG PO TABS
500.0000 mg | ORAL_TABLET | Freq: Two times a day (BID) | ORAL | Status: DC
  Administered 2013-06-12 – 2013-06-13 (×2): 500 mg via ORAL
  Filled 2013-06-12 (×3): qty 1

## 2013-06-12 MED ORDER — DIPHENHYDRAMINE HCL 12.5 MG/5ML PO ELIX: 12.5000 mg | ORAL_SOLUTION | Freq: Four times a day (QID) | ORAL | Status: DC | PRN

## 2013-06-12 MED ORDER — IBUPROFEN 600 MG PO TABS
600.0000 mg | ORAL_TABLET | Freq: Four times a day (QID) | ORAL | Status: DC | PRN
  Administered 2013-06-13: 600 mg via ORAL
  Filled 2013-06-12: qty 1

## 2013-06-12 MED ORDER — ONDANSETRON HCL 4 MG/2ML IJ SOLN
INTRAMUSCULAR | Status: AC
  Filled 2013-06-12: qty 2

## 2013-06-12 MED ORDER — LIDOCAINE HCL (CARDIAC) 20 MG/ML IV SOLN
INTRAVENOUS | Status: AC
  Filled 2013-06-12: qty 5

## 2013-06-12 MED ORDER — BUPIVACAINE HCL (PF) 0.25 % IJ SOLN
INTRAMUSCULAR | Status: AC
  Filled 2013-06-12: qty 30

## 2013-06-12 MED ORDER — FENTANYL CITRATE 0.05 MG/ML IJ SOLN
INTRAMUSCULAR | Status: AC
  Administered 2013-06-12: 50 ug via INTRAVENOUS
  Filled 2013-06-12: qty 2

## 2013-06-12 MED ORDER — HYDROMORPHONE 0.3 MG/ML IV SOLN
INTRAVENOUS | Status: DC
  Administered 2013-06-12: 0.199 mg via INTRAVENOUS
  Administered 2013-06-12: 2.19 mg via INTRAVENOUS
  Administered 2013-06-12: 1.99 mg via INTRAVENOUS
  Administered 2013-06-12: 2.19 mg via INTRAVENOUS
  Administered 2013-06-12: 11:00:00 via INTRAVENOUS
  Filled 2013-06-12: qty 25

## 2013-06-12 MED ORDER — DIPHENHYDRAMINE HCL 50 MG/ML IJ SOLN: 12.5000 mg | Freq: Four times a day (QID) | INTRAMUSCULAR | Status: DC | PRN

## 2013-06-12 MED ORDER — ONDANSETRON HCL 4 MG/2ML IJ SOLN: 4.0000 mg | Freq: Four times a day (QID) | INTRAMUSCULAR | Status: DC | PRN

## 2013-06-12 MED ORDER — NEOSTIGMINE METHYLSULFATE 1 MG/ML IJ SOLN
INTRAMUSCULAR | Status: AC
  Filled 2013-06-12: qty 1

## 2013-06-12 MED ORDER — MENTHOL 3 MG MT LOZG: 1.0000 | LOZENGE | OROMUCOSAL | Status: DC | PRN

## 2013-06-12 MED ORDER — HYDROMORPHONE HCL PF 1 MG/ML IJ SOLN: 0.2000 mg | INTRAMUSCULAR | Status: DC | PRN

## 2013-06-12 MED ORDER — HYDROMORPHONE HCL PF 1 MG/ML IJ SOLN
INTRAMUSCULAR | Status: AC
  Filled 2013-06-12: qty 1

## 2013-06-12 MED ORDER — HYDROMORPHONE HCL PF 1 MG/ML IJ SOLN
INTRAMUSCULAR | Status: AC
  Administered 2013-06-12: 0.5 mg via INTRAVENOUS
  Filled 2013-06-12: qty 1

## 2013-06-12 MED ORDER — FENTANYL CITRATE 0.05 MG/ML IJ SOLN
INTRAMUSCULAR | Status: AC
  Filled 2013-06-12: qty 5

## 2013-06-12 MED ORDER — MIDAZOLAM HCL 2 MG/2ML IJ SOLN
INTRAMUSCULAR | Status: AC
  Filled 2013-06-12: qty 2

## 2013-06-12 MED ORDER — LISINOPRIL 20 MG PO TABS
20.0000 mg | ORAL_TABLET | Freq: Every day | ORAL | Status: DC
  Filled 2013-06-12 (×2): qty 1

## 2013-06-12 MED ORDER — LACTATED RINGERS IV SOLN
INTRAVENOUS | Status: DC
  Administered 2013-06-12: 50 mL/h via INTRAVENOUS
  Administered 2013-06-12: 08:00:00 via INTRAVENOUS

## 2013-06-12 MED ORDER — PROPOFOL 10 MG/ML IV BOLUS
INTRAVENOUS | Status: DC | PRN
  Administered 2013-06-12: 200 mg via INTRAVENOUS

## 2013-06-12 MED ORDER — NEOSTIGMINE METHYLSULFATE 1 MG/ML IJ SOLN
INTRAMUSCULAR | Status: DC | PRN
  Administered 2013-06-12: 3 mg via INTRAVENOUS

## 2013-06-12 MED ORDER — MIDAZOLAM HCL 2 MG/2ML IJ SOLN
INTRAMUSCULAR | Status: DC | PRN
  Administered 2013-06-12: 1 mg via INTRAVENOUS

## 2013-06-12 SURGICAL SUPPLY — 44 items
BLADE 15 SAFETY STRL DISP (BLADE) ×4 IMPLANT
CABLE HIGH FREQUENCY MONO STRZ (ELECTRODE) IMPLANT
CATH ROBINSON RED A/P 16FR (CATHETERS) ×4 IMPLANT
CLOSURE WOUND 1/4 X3 (GAUZE/BANDAGES/DRESSINGS)
CLOTH BEACON ORANGE TIMEOUT ST (SAFETY) ×4 IMPLANT
CONT PATH 16OZ SNAP LID 3702 (MISCELLANEOUS) ×4 IMPLANT
COVER TABLE BACK 60X90 (DRAPES) ×4 IMPLANT
DECANTER SPIKE VIAL GLASS SM (MISCELLANEOUS) IMPLANT
DERMABOND ADHESIVE PROPEN (GAUZE/BANDAGES/DRESSINGS) ×2
DERMABOND ADVANCED (GAUZE/BANDAGES/DRESSINGS) ×2
DERMABOND ADVANCED .7 DNX12 (GAUZE/BANDAGES/DRESSINGS) ×2 IMPLANT
DERMABOND ADVANCED .7 DNX6 (GAUZE/BANDAGES/DRESSINGS) ×2 IMPLANT
DRSG COVADERM PLUS 2X2 (GAUZE/BANDAGES/DRESSINGS) ×4 IMPLANT
ELECT LIGASURE LONG (ELECTRODE) ×4 IMPLANT
ELECT REM PT RETURN 9FT ADLT (ELECTROSURGICAL)
ELECTRODE REM PT RTRN 9FT ADLT (ELECTROSURGICAL) IMPLANT
FORCEPS CUTTING 45CM 5MM (CUTTING FORCEPS) ×4 IMPLANT
GLOVE BIO SURGEON STRL SZ8 (GLOVE) ×4 IMPLANT
GLOVE BIOGEL PI IND STRL 6.5 (GLOVE) ×2 IMPLANT
GLOVE BIOGEL PI INDICATOR 6.5 (GLOVE) ×2
GLOVE SURG ORTHO 8.0 STRL STRW (GLOVE) ×16 IMPLANT
GOWN STRL REUS W/TWL LRG LVL3 (GOWN DISPOSABLE) ×16 IMPLANT
NEEDLE INSUFFLATION 120MM (ENDOMECHANICALS) ×4 IMPLANT
NS IRRIG 1000ML POUR BTL (IV SOLUTION) ×4 IMPLANT
PACK LAVH (CUSTOM PROCEDURE TRAY) ×4 IMPLANT
PROTECTOR NERVE ULNAR (MISCELLANEOUS) ×4 IMPLANT
SET IRRIG TUBING LAPAROSCOPIC (IRRIGATION / IRRIGATOR) IMPLANT
SOLUTION ELECTROLUBE (MISCELLANEOUS) IMPLANT
STRIP CLOSURE SKIN 1/4X3 (GAUZE/BANDAGES/DRESSINGS) IMPLANT
SUT MNCRL 0 MO-4 VIOLET 18 CR (SUTURE) ×4 IMPLANT
SUT MNCRL 0 VIOLET 6X18 (SUTURE) ×2 IMPLANT
SUT MNCRL AB 0 CT1 27 (SUTURE) IMPLANT
SUT MON AB 2-0 CT1 36 (SUTURE) IMPLANT
SUT MONOCRYL 0 6X18 (SUTURE) ×2
SUT MONOCRYL 0 MO 4 18  CR/8 (SUTURE) ×4
SUT VICRYL 0 UR6 27IN ABS (SUTURE) ×4 IMPLANT
SUT VICRYL RAPIDE 3 0 (SUTURE) ×4 IMPLANT
SYR 20CC LL (SYRINGE) ×4 IMPLANT
TOWEL OR 17X24 6PK STRL BLUE (TOWEL DISPOSABLE) ×8 IMPLANT
TRAY FOLEY CATH 14FR (SET/KITS/TRAYS/PACK) ×4 IMPLANT
TROCAR OPTI TIP 5M 100M (ENDOMECHANICALS) ×4 IMPLANT
TROCAR XCEL DIL TIP R 11M (ENDOMECHANICALS) ×4 IMPLANT
WARMER LAPAROSCOPE (MISCELLANEOUS) ×4 IMPLANT
WATER STERILE IRR 1000ML POUR (IV SOLUTION) ×4 IMPLANT

## 2013-06-12 NOTE — Brief Op Note (Signed)
06/12/2013  9:00 AM  PATIENT:  Candace Walker  43 y.o. female  PRE-OPERATIVE DIAGNOSIS:  pelvic pain, fibroids  POST-OPERATIVE DIAGNOSIS:  pelvic pain, fibroids  PROCEDURE:  Procedure(s): LAPAROSCOPIC ASSISTED VAGINAL HYSTERECTOMY (N/A) LAPAROSCOPIC BILATERAL SALPINGECTOMY  SURGEON:  Surgeon(s) and Role:    * Luz Lex, MD - Primary    * Marylynn Pearson, MD - Assisting  PHYSICIAN ASSISTANT:   ASSISTANTS: Adkins   ANESTHESIA:   local and general  EBL:  Total I/O In: 1000 [I.V.:1000] Out: 650 [Urine:350; Blood:300]  BLOOD ADMINISTERED:none  DRAINS: Urinary Catheter (Foley)   LOCAL MEDICATIONS USED:  MARCAINE     SPECIMEN:  Source of Specimen:  uterus and both fallopian tubes  DISPOSITION OF SPECIMEN:  PATHOLOGY  COUNTS:  YES  TOURNIQUET:  * No tourniquets in log *  DICTATION: .Other Dictation: Dictation Number 1  PLAN OF CARE: Admit for overnight observation  PATIENT DISPOSITION:  PACU - hemodynamically stable.   Delay start of Pharmacological VTE agent (>24hrs) due to surgical blood loss or risk of bleeding: not applicable

## 2013-06-12 NOTE — Anesthesia Postprocedure Evaluation (Signed)
  Anesthesia Post-op Note  Patient: Candace Walker  Procedure(s) Performed: Procedure(s): LAPAROSCOPIC ASSISTED VAGINAL HYSTERECTOMY (N/A) LAPAROSCOPIC BILATERAL SALPINGECTOMY  Patient Location: PACU  Anesthesia Type:General  Level of Consciousness: awake, alert  and oriented  Airway and Oxygen Therapy: Patient Spontanous Breathing  Post-op Pain: mild  Post-op Assessment: Post-op Vital signs reviewed, Patient's Cardiovascular Status Stable, Respiratory Function Stable, Patent Airway, No signs of Nausea or vomiting and Pain level controlled  Post-op Vital Signs: Reviewed and stable  Last Vitals:  Filed Vitals:   06/12/13 1008  BP:   Pulse: 73  Temp:   Resp: 18    Complications: No apparent anesthesia complications

## 2013-06-12 NOTE — Transfer of Care (Signed)
Immediate Anesthesia Transfer of Care Note  Patient: Candace Walker  Procedure(s) Performed: Procedure(s): LAPAROSCOPIC ASSISTED VAGINAL HYSTERECTOMY (N/A) LAPAROSCOPIC BILATERAL SALPINGECTOMY  Patient Location: PACU  Anesthesia Type:General  Level of Consciousness: awake, alert , oriented and patient cooperative  Airway & Oxygen Therapy: Patient Spontanous Breathing and Patient connected to nasal cannula oxygen  Post-op Assessment: Report given to PACU RN and Post -op Vital signs reviewed and stable  Post vital signs: Reviewed and stable  Complications: No apparent anesthesia complications

## 2013-06-13 ENCOUNTER — Encounter (HOSPITAL_COMMUNITY): Payer: Self-pay | Admitting: Obstetrics and Gynecology

## 2013-06-13 LAB — CBC
HEMATOCRIT: 30.1 % — AB (ref 36.0–46.0)
HEMOGLOBIN: 10 g/dL — AB (ref 12.0–15.0)
MCH: 29.2 pg (ref 26.0–34.0)
MCHC: 33.2 g/dL (ref 30.0–36.0)
MCV: 88 fL (ref 78.0–100.0)
Platelets: 163 10*3/uL (ref 150–400)
RBC: 3.42 MIL/uL — ABNORMAL LOW (ref 3.87–5.11)
RDW: 13 % (ref 11.5–15.5)
WBC: 11.2 10*3/uL — AB (ref 4.0–10.5)

## 2013-06-13 MED ORDER — IBUPROFEN 600 MG PO TABS: 600.0000 mg | ORAL_TABLET | Freq: Four times a day (QID) | ORAL | Status: DC | PRN

## 2013-06-13 MED ORDER — HYDROCODONE-ACETAMINOPHEN 5-325 MG PO TABS: 1.0000 | ORAL_TABLET | Freq: Four times a day (QID) | ORAL | Status: DC | PRN

## 2013-06-13 NOTE — Anesthesia Postprocedure Evaluation (Signed)
  Anesthesia Post-op Note  Patient: Candace Walker  Procedure(s) Performed: Procedure(s): LAPAROSCOPIC ASSISTED VAGINAL HYSTERECTOMY (N/A) LAPAROSCOPIC BILATERAL SALPINGECTOMY  Patient Location: [facility]  Anesthesia Type:General  Level of Consciousness: awake and alert   Airway and Oxygen Therapy: Patient Spontanous Breathing  Post-op Pain: none  Post-op Assessment: Post-op Vital signs reviewed  Post-op Vital Signs: Reviewed and stable  Last Vitals:  Filed Vitals:   06/13/13 0528  BP: 116/70  Pulse: 85  Temp: 37.3 C  Resp: 18    Complications: No apparent anesthesia complications

## 2013-06-13 NOTE — Addendum Note (Signed)
Addendum created 06/13/13 9038 by [name], CRNA   Modules edited: Notes Section   Notes Section:  File: [id number]

## 2013-06-13 NOTE — Progress Notes (Signed)
Pt is discharged in the care of Mother Downstairs per ambulatory. Spirits are good. Abdominal lapsites are clean and dry Denies any heavy bleeding. States abdominal pain is better now . No equipment needed for home use. Understands all instructions well.

## 2013-06-13 NOTE — Op Note (Signed)
NAMELASHAWNE, DURA           ACCOUNT NO.:  [id number]  MEDICAL RECORD NO.:  [id number]  LOCATION:  9304                          FACILITY:  [facility]  PHYSICIAN:  Monia Sabal. Corinna Capra, M.D.    DATE OF BIRTH:  1970-10-20  DATE OF PROCEDURE:  06/12/2013 DATE OF DISCHARGE:                              OPERATIVE REPORT   PREOPERATIVE DIAGNOSIS:  Pelvic pain, uterine fibroids.  POSTOPERATIVE DIAGNOSIS:  Pelvic pain, uterine fibroids.  PROCEDURE:  Laparoscopic-assisted vaginal hysterectomy with bilateral salpingectomy.  SURGEON:  Monia Sabal. Corinna Capra, M.D.  ASSISTANT:  Marylynn Pearson, MD  INDICATIONS:  Ms. Pryce has had worsening pelvic pain over the last several years.  She had myomectomy previously in the past.  Now, she has continued pelvic pain.  She has got a significant amount of pelvic pain .  At this time, she desires definitive surgical management with hysterectomy.  Risks of the procedure were discussed at length regarding the surgery.  Normal-appearing appendix, liver, uterus, fallopian tubes, and ovaries.  Uterus is slightly enlarged.  DESCRIPTION OF PROCEDURE:  After adequate analgesia, the patient was placed in dorsal lithotomy position.  She is sterilely prepped and draped.  Bladder sterilely drained.  Graves speculum was placed.  A tenaculum was placed on the cervix.  A 1 cm infraumbilical skin incision was made.  The Veress needle was inserted.  The abdomen was insufflated with dullness to percussion.  An 11-mm trocar was inserted.  The above findings were then noted.  A 5-mm trocar was inserted left of the midline 2 fingerbreadths above the pubic symphysis under direct visualization.  After careful and systematic evaluation of the abdomen and pelvis, a Gyrus cutting forceps used to ligate across the mesosalpinx bilaterally and across the utero-ovarian ligaments bilaterally down across the round ligament to the inferior portion of the broad ligament.  This was done  bilaterally with good hemostasis. Both fallopian tubes were easily removed.  The bladder was elevated and a small window was made creating bladder flap between uterus and the bladder.  legs were repositioned.  Weighted speculum placed in the vagina.  A posterior colpotomy was performed. The cervix was then circumscribed with Bovie cautery.  LigaSure instrument was used to ligate across the uterosacral ligaments bilaterally. The bladder was elevated after entering the anterior peritoneum.  The cardinal ligaments bilaterally and then uterine vasculature ligated.  A Deaver retractor was placed below the bladder anteriorly.  Uterine vasculature was ligated with the LigaSure. Ligasure was used to the inferior portion of the broad ligament.  Good hemostasis achieve.  The uterosacral ligaments were identified and suture ligated with figure-of-eight followed by uterine vasculature was grasped and suture ligated with 0 Vicryl suture with good hemostasis achieved.  The posterior cuff was then closed in pursestring fashion with 0 Monocryl suture.  The uterosacral ligaments were then plicated in the midline using figure-of-eight suture close to the vagina in a vertical fashion.  Good approximation, hemostasis, and the anterior aspect with similar fashion with vertical fashion figure-of-eight 0 Vicryl suture.  Hemostasis was good support.  The  Foley catheter was placed with return of clear yellow urine.  The abdomen was re-insufflated.  Nezhat suction irrigator was used to irrigate  the abdomen and pelvis with good hemostasis achieved.  Small peritoneal bleeders were coagulated using bipolar cautery.  After careful systematic evaluation of pelvis hemastasis was achieved.  Good peristalsis was noted at the ureters bilaterally.  20 mL of benzozaine was injected into the cul-de-sac.  The abdomen was then desufflated and trocars were removed.  The infraumbilical skin incision was closed with 0 Vicryl  suture interrupted suture in the fascia and 3-0 Vicryl Rapide subcuticular suture.  The 5 mm site was closed with Dermabond.  The incisions were injected with 0.25% Marcaine, 2 mL used.  The patient was then transferred to recovery room in stable condition.  Sponge and instruments counts were correct x3.  At this point, she received 2 g of cefotetan, and she stable on transfer to the  recovery room and admitted overnight for observation.     Monia Sabal Corinna Capra, M.D.     DCL/MEDQ  D:  06/12/2013  T:  06/13/2013  Job:  [id number]

## 2013-06-13 NOTE — Discharge Summary (Signed)
Physician Discharge Summary  Patient ID: Candace Walker MRN: [id number] DOB/AGE: [birth year] 44 y.o.  Admit date: 06/12/2013 Discharge date: 06/13/2013  Admission Diagnoses:  Discharge Diagnoses:  Active Problems:   S/P laparoscopic assisted vaginal hysterectomy (LAVH)   Discharged Condition: good  Hospital Course: Pt underwent uncomplicated LAVH with Bil Salpingectomy.  Her post op course was unremarkable with quick return of appetite, ambulation and on Postop D 1 was passing flatus, tolerating regular diet and pain well controlled with oral vicodan, and d/c'd home.  Consults: None  Significant Diagnostic Studies: labs: hgb 10.0  Treatments: surgery: LAVH, Bil Salpingectomy  Discharge Exam: Blood pressure 116/70, pulse 85, temperature 99.2 F (37.3 C), temperature source Oral, resp. rate 18, height 5\' 8"  (1.727 m), weight 108.41 kg (239 lb), SpO2 100.00%. General appearance: alert, cooperative, appears stated age and no distress GI: soft, non-tender; bowel sounds normal; no masses,  no organomegaly Incision/Wound:CD&I  Disposition: 01-Home or Self Care  Discharge Orders   Future Orders Complete By Expires   Call MD for:  difficulty breathing, headache or visual disturbances  As directed    Call MD for:  persistant nausea and vomiting  As directed    Call MD for:  redness, tenderness, or signs of infection (pain, swelling, redness, odor or green/yellow discharge around incision site)  As directed    Call MD for:  severe uncontrolled pain  As directed    Call MD for:  temperature >100.4  As directed    Diet general  As directed    Driving Restrictions  As directed    Increase activity slowly  As directed    Lifting restrictions  As directed    Sexual Activity Restrictions  As directed        Medication List         albuterol 108 (90 BASE) MCG/ACT inhaler  Commonly known as:  PROVENTIL HFA;VENTOLIN HFA  Inhale 2 puffs into the lungs every 6 (six) hours as needed  for wheezing.     cholecalciferol 1000 UNITS tablet  Commonly known as:  VITAMIN D  Take 1,000 Units by mouth daily.     fexofenadine 180 MG tablet  Commonly known as:  ALLEGRA  Take 180 mg by mouth at bedtime.     fish oil-omega-3 fatty acids 1000 MG capsule  Take 3 g by mouth daily.     HYDROcodone-acetaminophen 5-325 MG per tablet  Commonly known as:  NORCO/VICODIN  Take 1 tablet by mouth every 6 (six) hours as needed for moderate pain.     HYDROcodone-acetaminophen 5-325 MG per tablet  Commonly known as:  NORCO/VICODIN  Take 1-2 tablets by mouth every 6 (six) hours as needed for moderate pain.     ibuprofen 600 MG tablet  Commonly known as:  ADVIL,MOTRIN  Take 1 tablet (600 mg total) by mouth every 6 (six) hours as needed (mild pain).     lisinopril-hydrochlorothiazide 20-12.5 MG per tablet  Commonly known as:  PRINZIDE,ZESTORETIC  Take 1 tablet by mouth daily.     methocarbamol 500 MG tablet  Commonly known as:  ROBAXIN  Take 500 mg by mouth 2 (two) times daily.     montelukast 10 MG tablet  Commonly known as:  SINGULAIR  Take 1 tablet (10 mg total) by mouth at bedtime.     multivitamin with minerals tablet  Take 1 tablet by mouth daily.         Signed: Luz Lex 06/13/2013, 9:31 AM

## 2013-06-27 ENCOUNTER — Ambulatory Visit (HOSPITAL_COMMUNITY)
Admission: RE | Admit: 2013-06-27 | Discharge: 2013-06-27 | Disposition: A | Discharge status: Home / Self Care | Payer: 59 | Source: Ambulatory Visit | Attending: Obstetrics and Gynecology | Admitting: Obstetrics and Gynecology

## 2013-06-27 ENCOUNTER — Other Ambulatory Visit (HOSPITAL_COMMUNITY): Payer: Self-pay | Admitting: Obstetrics and Gynecology

## 2013-06-27 DIAGNOSIS — M79609 Pain in unspecified limb: Secondary | ICD-10-CM | POA: Insufficient documentation

## 2013-06-27 DIAGNOSIS — M7989 Other specified soft tissue disorders: Secondary | ICD-10-CM

## 2013-06-27 NOTE — Progress Notes (Signed)
VASCULAR LAB PRELIMINARY  PRELIMINARY  PRELIMINARY  PRELIMINARY  Left lower extremity venous duplex completed.    Preliminary report:  Left:  No evidence of DVT, superficial thrombosis, or Baker's cyst.  Nani Ravens, RVT 06/27/2013, 1:55 PM

## 2013-07-12 ENCOUNTER — Ambulatory Visit (INDEPENDENT_AMBULATORY_CARE_PROVIDER_SITE_OTHER): Payer: 59 | Admitting: Family Medicine

## 2013-07-12 ENCOUNTER — Encounter: Payer: Self-pay | Admitting: Family Medicine

## 2013-07-12 VITALS — BP 118/74 | HR 80 | Temp 98.5°F | Ht 69.0 in | Wt 240.0 lb

## 2013-07-12 DIAGNOSIS — J069 Acute upper respiratory infection, unspecified: Secondary | ICD-10-CM

## 2013-07-12 MED ORDER — AZITHROMYCIN 250 MG PO TABS: ORAL_TABLET | ORAL | Status: DC

## 2013-07-12 NOTE — Patient Instructions (Signed)
  Drink plenty of fluids. Consider sinus rinse kit vs Neti-pot (using twice daily until symptoms resolve). Add Guaifenesin (ie Mucinex)--get a single ingredient, rather than a combination, so that you can use it along with the alka selzer plus.  Do not start the antibiotic--only start it if symptoms persist and worsen after 7-10 days.  I suspect this is a virus, and symptoms should begin improving over the next 3-5 days.

## 2013-07-12 NOTE — Progress Notes (Signed)
Chief Complaint  Patient presents with  . Cough    mostly at night. Sinus congestion and pressure since Saturday. Mucus is yellowish in color. Had fever yesterday of 100. Has tried alka seltzer plus severe sinus congestion and nasacort without any improvement.     "I can't sleep at night" due to congestion, headaches.  Symptoms began 4 days ago with headaches, congestion, runny nose.  Pain across her forehead and behind her eyes.  Nasal mucus is sometimes white, sometimes yellowish.  She is coughing mainly at night, and phlegm is yellowish.  She noticed fever yesterday, and has some chills at night.  Denies wheezing or shortness of breath. She had some body aches the first couple of days, those have improved.  She has been using Copywriter, advertising plus (decongestant, antihistamine, DM and acetaminophen) every 4 hours, and Nasacort (she started 3 days ago).  She reports needing refill on singulair--review of chart shows that refills shouldn't be needed (should be on file at [facility]).  Past Medical History  Diagnosis Date  . Allergic rhinitis, cause unspecified   . Essential hypertension, benign   . Mixed hyperlipidemia     no meds, diet controlled  . Unspecified vitamin D deficiency   . Positive PPD     h/o BCG vaccine, negative chest xray  . SVD (spontaneous vaginal delivery)     x 2  . Asthma with allergic rhinitis     rarely uses inhaler unless she gets sick  . 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 Tubal ligation    . Endometrial ablation  2007    Dr. Corinna Capra  . Wisdom tooth extraction      right bottom lower tooth  . Laparoscopic assisted vaginal hysterectomy N/A 06/12/2013    Procedure: LAPAROSCOPIC ASSISTED VAGINAL HYSTERECTOMY;  Surgeon: Luz Lex, MD;  Location: [facility];  Service: Gynecology;  Laterality: N/A;  . Laparoscopic bilateral salpingectomy  06/12/2013    Procedure: LAPAROSCOPIC  BILATERAL SALPINGECTOMY;  Surgeon: Luz Lex, MD;  Location: [facility];  Service: Gynecology;;   History   Social History  . Marital Status: Divorced    Spouse Name: N/A    Number of Children: 2  . Years of Education: N/A   Occupational History  . Nurse [employer]   Social History Main Topics  . Smoking status: Never Smoker   . Smokeless tobacco: Never Used  . Alcohol Use: Yes     Comment: occasional (once a month)  . Drug Use: No  . Sexual Activity: Not Currently    Birth Control/ Protection: Surgical     Comment: ablation and tubal ligation   Other Topics Concern  . Not on file   Social History Narrative   Lives with her son; no pets. Divorced from her husband.  Daughter is in the army, living in [place]   Outpatient Encounter Prescriptions as of 07/12/2013  Medication Sig Note  . albuterol (PROVENTIL HFA;VENTOLIN HFA) 108 (90 BASE) MCG/ACT inhaler Inhale 2 puffs into the lungs every 6 (six) hours as needed for wheezing. 04/24/2013: Last needed when sick in December 2014  . cholecalciferol (VITAMIN D) 1000 UNITS tablet Take 1,000 Units by mouth daily.     . fexofenadine (ALLEGRA) 180 MG tablet Take 180 mg by mouth at bedtime.    . fish oil-omega-3 fatty acids 1000 MG capsule Take 3 g by mouth daily.    Marland Kitchen  lisinopril-hydrochlorothiazide (PRINZIDE,ZESTORETIC) 20-12.5 MG per tablet Take 1 tablet by mouth daily.   . montelukast (SINGULAIR) 10 MG tablet Take 1 tablet (10 mg total) by mouth at bedtime.   . Multiple Vitamins-Minerals (MULTIVITAMIN WITH MINERALS) tablet Take 1 tablet by mouth daily.     Marland Kitchen azithromycin (ZITHROMAX) 250 MG tablet Take 2 tablets by mouth on first day, then 1 tablet by mouth on days 2 through 5   . ibuprofen (ADVIL,MOTRIN) 600 MG tablet Take 1 tablet (600 mg total) by mouth every 6 (six) hours as needed (mild pain).   . [DISCONTINUED] HYDROcodone-acetaminophen (NORCO/VICODIN) 5-325 MG per tablet Take 1 tablet by mouth every 6 (six) hours as needed for  moderate pain. 04/24/2013: From Dr. Autumn Patty using also for hip pain.  Uses it for about 3 days every 2 weeks (after working 3 days in a row)  . [DISCONTINUED] HYDROcodone-acetaminophen (NORCO/VICODIN) 5-325 MG per tablet Take 1-2 tablets by mouth every 6 (six) hours as needed for moderate pain.   . [DISCONTINUED] methocarbamol (ROBAXIN) 500 MG tablet Take 500 mg by mouth 2 (two) times daily.    Allergies  Allergen Reactions  . Penicillins Hives  . Percocet [Oxycodone-Acetaminophen] Itching   ROS:  Denies nausea, vomiting, diarrhea,shortness of breath, wheezing, chest pain, ear pain.  Hip pain resolved after hysterectomy.  Just occasional bursitis pain.  PHYSICAL EXAM: BP 118/74  Pulse 80  Temp(Src) 98.5 F (36.9 C) (Oral)  Ht 5' 9"  (1.753 m)  Wt 240 lb (108.863 kg)  BMI 35.43 kg/m2 Pleasant female in no distress.  No coughing during exam HEENT:  PERRL, EOMI, conjunctiva clear. Tm's and EAC's normal. Nasal mucosa mildly edematous, without erythema or purulence.  Sinuses are nontender. OP is clear, no erythema, moist mucus membranes Neck: no lymphadenopathy or mass Heart: regular rate and rhythm Lungs: clear bilaterally. No wheezes, rales, ronchi Skin: no rashes/lesions Neuro: alert and oriented. Cranial nerves intact. Normal strength, gait Psych: normal mood, affect  ASSESSMENT/PLAN:  Acute upper respiratory infections of unspecified site - Plan: azithromycin (ZITHROMAX) 250 MG tablet  Suspect viral URI.  Drink plenty of fluids. Consider sinus rinse kit vs Neti-pot (using twice daily until symptoms resolve). Add Guaifenesin (ie Mucinex)--get a single ingredient, rather than a combination, so that you can use it along with the alka selzer plus.  Do not start the antibiotic--only start it if symptoms persist and worsen after 7-10 days.  I suspect this is a virus, and symptoms should begin improving over the next 3-5 days.

## 2013-12-11 ENCOUNTER — Encounter: Payer: Self-pay | Admitting: Family Medicine

## 2013-12-11 ENCOUNTER — Ambulatory Visit (INDEPENDENT_AMBULATORY_CARE_PROVIDER_SITE_OTHER): Payer: 59 | Admitting: Family Medicine

## 2013-12-11 VITALS — BP 120/78 | HR 72 | Ht 69.0 in | Wt 244.0 lb

## 2013-12-11 DIAGNOSIS — E669 Obesity, unspecified: Secondary | ICD-10-CM

## 2013-12-11 DIAGNOSIS — R5383 Other fatigue: Secondary | ICD-10-CM

## 2013-12-11 DIAGNOSIS — R0683 Snoring: Secondary | ICD-10-CM

## 2013-12-11 DIAGNOSIS — I10 Essential (primary) hypertension: Secondary | ICD-10-CM

## 2013-12-11 DIAGNOSIS — E559 Vitamin D deficiency, unspecified: Secondary | ICD-10-CM

## 2013-12-11 DIAGNOSIS — G478 Other sleep disorders: Secondary | ICD-10-CM

## 2013-12-11 DIAGNOSIS — E785 Hyperlipidemia, unspecified: Secondary | ICD-10-CM

## 2013-12-11 LAB — CBC WITH DIFFERENTIAL/PLATELET
BASOS ABS: 0 10*3/uL (ref 0.0–0.1)
Basophils Relative: 0 % (ref 0–1)
Eosinophils Absolute: 0 10*3/uL (ref 0.0–0.7)
Eosinophils Relative: 1 % (ref 0–5)
HEMATOCRIT: 38.4 % (ref 36.0–46.0)
Hemoglobin: 13 g/dL (ref 12.0–15.0)
LYMPHS PCT: 35 % (ref 12–46)
Lymphs Abs: 1.4 10*3/uL (ref 0.7–4.0)
MCH: 28.9 pg (ref 26.0–34.0)
MCHC: 33.9 g/dL (ref 30.0–36.0)
MCV: 85.3 fL (ref 78.0–100.0)
MONO ABS: 0.2 10*3/uL (ref 0.1–1.0)
Monocytes Relative: 5 % (ref 3–12)
NEUTROS ABS: 2.4 10*3/uL (ref 1.7–7.7)
Neutrophils Relative %: 59 % (ref 43–77)
PLATELETS: 180 10*3/uL (ref 150–400)
RBC: 4.5 MIL/uL (ref 3.87–5.11)
RDW: 15.2 % (ref 11.5–15.5)
WBC: 4.1 10*3/uL (ref 4.0–10.5)

## 2013-12-11 NOTE — Patient Instructions (Signed)
Continue your current medications and vitamins.  We will be in touch with your lab results.  If all results are normal, it is important that we proceed with the sleep study like we discussed, to evaluate for sleep apnea.

## 2013-12-11 NOTE — Progress Notes (Signed)
Subjective:     Patient ID: Candace Walker, female   DOB: January 14, 1971, 43 y.o.   MRN: [id number]  Candace Walker presents for Fatigue  Chief Complaint  Patient presents with  . Fatigue    complains of extreme fatigue. Just feels "physically exhausted." Cannot lose wieght no matter what she does. This has been since her hysterectomy.    She doesn't wake up feeling refreshed, feels "exhausted" right from the start.  She sleeps well--goes to bed 9-10pm, wakes up 4:30-5 to use the bathroom, and then wakes up at 6am for work.  She has been told that she snores, and knows she snores "more when she is very tired".   She denies morning headaches.  Symptoms began about a month after her hysterectomy.  She still has her ovaries.  She remembers feeling similarly when her vitamin D was low in the past. She denies any flare of allergies or any depression. She has also been having trouble losing weight. She continues to exercise.  Hyperlipidemia:  She is due for recheck.  She has been trying to follow a lowfat, low cholesterol diet.  She doesn't eat red meat often, only eats egg whites.  She uses a light salad dressing (onion--somewhat creamy, per pt).  Hypertension follow-up: Denies dizziness, headaches, chest pain.  Denies side effects of medications, compliant in taking meds.   Past Medical History  Diagnosis Date  . Allergic rhinitis, cause unspecified   . Essential hypertension, benign   . Mixed hyperlipidemia     no meds, diet controlled  . Unspecified vitamin D deficiency   . Positive PPD     h/o BCG vaccine, negative chest xray  . SVD (spontaneous vaginal delivery)     x 2  . Asthma with allergic rhinitis     rarely uses inhaler unless she gets sick  . 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 Tubal ligation    . Endometrial ablation  2007    Dr. Corinna Capra  . Wisdom tooth extraction      right bottom lower tooth  . Laparoscopic assisted vaginal hysterectomy N/A 06/12/2013    Procedure: LAPAROSCOPIC ASSISTED VAGINAL HYSTERECTOMY;  Surgeon: Luz Lex, MD;  Location: [facility];  Service: Gynecology;  Laterality: N/A;  . Laparoscopic bilateral salpingectomy  06/12/2013    Procedure: LAPAROSCOPIC BILATERAL SALPINGECTOMY;  Surgeon: Luz Lex, MD;  Location: [facility];  Service: Gynecology;;   History   Social History  . Marital Status: Divorced    Spouse Name: N/A    Number of Children: 2  . Years of Education: N/A   Occupational History  . Nurse [employer]   Social History Main Topics  . Smoking status: Never Smoker   . Smokeless tobacco: Never Used  . Alcohol Use: Yes     Comment: occasional (once a month)  . Drug Use: No  . Sexual Activity: Not Currently    Birth Control/ Protection: Surgical     Comment: ablation and tubal ligation   Other Topics Concern  . Not on file   Social History Narrative   Lives with her son; no pets. Divorced from her husband.  Daughter is in the army, living in [place]    Outpatient Encounter Prescriptions as of 12/11/2013  Medication Sig  . cholecalciferol (VITAMIN D) 1000 UNITS tablet Take 1,000 Units by mouth daily.    Marland Kitchen  fexofenadine (ALLEGRA) 180 MG tablet Take 180 mg by mouth at bedtime.   . fish oil-omega-3 fatty acids 1000 MG capsule Take 3 g by mouth daily.   Marland Kitchen lisinopril-hydrochlorothiazide (PRINZIDE,ZESTORETIC) 20-12.5 MG per tablet Take 1 tablet by mouth daily.  . montelukast (SINGULAIR) 10 MG tablet Take 1 tablet (10 mg total) by mouth at bedtime.  . Multiple Vitamins-Minerals (MULTIVITAMIN WITH MINERALS) tablet Take 1 tablet by mouth daily.    Marland Kitchen albuterol (PROVENTIL HFA;VENTOLIN HFA) 108 (90 BASE) MCG/ACT inhaler Inhale 2 puffs into the lungs every 6 (six) hours as needed for wheezing.  Marland Kitchen ibuprofen (ADVIL,MOTRIN) 600 MG tablet Take 1 tablet (600 mg total) by mouth every 6 (six) hours as needed (mild pain).  .  [DISCONTINUED] azithromycin (ZITHROMAX) 250 MG tablet Take 2 tablets by mouth on first day, then 1 tablet by mouth on days 2 through 5   Allergies  Allergen Reactions  . Penicillins Hives  . Percocet [Oxycodone-Acetaminophen] Itching    Review of Systems  Constitutional: Positive for fatigue and unexpected weight change. Negative for fever, chills, activity change and appetite change.       Some weight gain, unable to lose  HENT: Negative.  Negative for congestion, postnasal drip, rhinorrhea, sinus pressure and sore throat.   Eyes: Negative.   Respiratory: Negative.  Negative for cough, chest tightness, shortness of breath and wheezing.   Cardiovascular: Negative.  Negative for chest pain, palpitations and leg swelling.  Gastrointestinal: Negative.  Negative for nausea, vomiting, abdominal pain, diarrhea, constipation and blood in stool.  Endocrine: Negative.  Negative for polydipsia and polyuria.  Genitourinary: Negative.  Negative for dysuria, frequency, hematuria, vaginal bleeding, vaginal discharge and pelvic pain.  Musculoskeletal: Negative.   Neurological: Negative.  Negative for dizziness, weakness, light-headedness and headaches.  Hematological: Negative.   Psychiatric/Behavioral: Negative.  Negative for sleep disturbance and dysphoric mood. The patient is not nervous/anxious.        Objective:     BP 120/78  Pulse 72  Ht 5\' 9"  (1.753 m)  Wt 244 lb (110.678 kg)  BMI 36.02 kg/m2  LMP 11/07/2012  Physical Exam  Constitutional: She is oriented to person, place, and time. She appears well-developed and well-nourished. No distress.  HENT:  Head: Normocephalic and atraumatic.  Right Ear: External ear normal.  Left Ear: External ear normal.  Mouth/Throat: Oropharynx is clear and moist.  Nasal mucosa is mildly edematous, with clear mucus.  No erythema or purulence  Eyes: Conjunctivae and EOM are normal. Pupils are equal, round, and reactive to light.  Neck: Normal range of  motion. Neck supple. No thyromegaly present.  Cardiovascular: Normal rate, regular rhythm, normal heart sounds and intact distal pulses.   Pulmonary/Chest: Effort normal and breath sounds normal. No respiratory distress. She has no wheezes.  Abdominal: Soft. Bowel sounds are normal. There is no tenderness.  Musculoskeletal: She exhibits no edema.  Lymphadenopathy:    She has no cervical adenopathy.  Neurological: She is alert and oriented to person, place, and time.  Skin: Skin is warm and dry.  Psychiatric: She has a normal mood and affect. Her behavior is normal. Thought content normal.       Assessment and Plan        Other fatigue - Plan: Comprehensive metabolic panel, CBC with Differential, Vit D  25 hydroxy (rtn osteoporosis monitoring), TSH, Split night study  Obesity (BMI 30-39.9)  Vitamin D deficiency - Plan: Vit D  25 hydroxy (rtn osteoporosis monitoring)  Hyperlipidemia - Plan: Lipid  panel  Essential hypertension, benign  Snoring - Plan: Split night study  Unrefreshed by sleep - Plan: Split night study    Patient is reluctant to do sleep study--explained at length the reasons for diagnosis and treating.  She is willing to proceed if labs don't explain her fatigue. DDx/many causes of fatigue were reviewed in detail.  I don't suspect that allergies or depression are a factor.  If labs are normal, then need to look for OSA.  We discussed sleep apnea, how to diagnose, risks of not treating, symptoms.  We discussed various treatment options if test is abnormal.  Discussed split night protocol, and that if diagnosis is made too late in the night, might need to do separate CPAP titration.  All questions were answered.

## 2013-12-12 LAB — COMPREHENSIVE METABOLIC PANEL
ALT: 15 U/L (ref 0–35)
AST: 22 U/L (ref 0–37)
Albumin: 4.1 g/dL (ref 3.5–5.2)
Alkaline Phosphatase: 57 U/L (ref 39–117)
BILIRUBIN TOTAL: 0.4 mg/dL (ref 0.2–1.2)
BUN: 17 mg/dL (ref 6–23)
CO2: 20 mEq/L (ref 19–32)
CREATININE: 1.07 mg/dL (ref 0.50–1.10)
Calcium: 9.5 mg/dL (ref 8.4–10.5)
Chloride: 103 mEq/L (ref 96–112)
GLUCOSE: 86 mg/dL (ref 70–99)
Potassium: 4.2 mEq/L (ref 3.5–5.3)
Sodium: 136 mEq/L (ref 135–145)
Total Protein: 7.7 g/dL (ref 6.0–8.3)

## 2013-12-12 LAB — LIPID PANEL
CHOLESTEROL: 236 mg/dL — AB (ref 0–200)
HDL: 45 mg/dL (ref >39)
LDL Cholesterol: 163 mg/dL — ABNORMAL HIGH (ref 0–99)
Total CHOL/HDL Ratio: 5.2 Ratio
Triglycerides: 140 mg/dL (ref <150)
VLDL: 28 mg/dL (ref 0–40)

## 2013-12-12 LAB — TSH: TSH: 1.017 u[IU]/mL (ref 0.350–4.500)

## 2013-12-12 LAB — VITAMIN D 25 HYDROXY (VIT D DEFICIENCY, FRACTURES): Vit D, 25-Hydroxy: 37 ng/mL (ref 30–89)

## 2013-12-18 ENCOUNTER — Encounter: Payer: Self-pay | Admitting: Family Medicine

## 2013-12-30 ENCOUNTER — Other Ambulatory Visit: Payer: Self-pay | Admitting: Family Medicine

## 2013-12-30 MED ORDER — BENZONATATE 100 MG PO CAPS: 200.0000 mg | ORAL_CAPSULE | Freq: Three times a day (TID) | ORAL | Status: DC | PRN

## 2014-01-02 ENCOUNTER — Ambulatory Visit (INDEPENDENT_AMBULATORY_CARE_PROVIDER_SITE_OTHER): Payer: 59 | Admitting: Medical

## 2014-01-02 VITALS — BP 110/80 | HR 78 | Temp 98.7°F | Resp 16 | Wt 245.0 lb

## 2014-01-02 DIAGNOSIS — J019 Acute sinusitis, unspecified: Secondary | ICD-10-CM

## 2014-01-02 DIAGNOSIS — J029 Acute pharyngitis, unspecified: Secondary | ICD-10-CM

## 2014-01-02 LAB — POCT RAPID STREP A (OFFICE): Rapid Strep A Screen: NEGATIVE

## 2014-01-02 MED ORDER — GUAIFENESIN-CODEINE 100-10 MG/5ML PO SYRP: 5.0000 mL | ORAL_SOLUTION | Freq: Three times a day (TID) | ORAL | Status: DC | PRN

## 2014-01-02 MED ORDER — AZITHROMYCIN 250 MG PO TABS: ORAL_TABLET | ORAL | Status: DC

## 2014-01-02 NOTE — Progress Notes (Signed)
Subjective:  Candace Walker is a 43 y.o. female who presents for possible sinus infection.  Symptoms include over a week history of sore throat, cough, and over the weekend chills, headaches, sinus pressure, worsening cough particularly at night, ear pressure and heaviness.  Low-grade fever. Has some shortness of breath and tightness in the chest.  She reports stuffy nose.  Denies rhinorrhea, nausea, vomiting, diarrhea.  Past history is significant for asthma. Get sinus infections 1-2 times per year typically.  Patient is a non-smoker.  Using Tylenol, Alka Seltzer plus, daily Singulair, Zyrtec for symptoms.  Has use albuterol twice for wheezing.  Denies sick contacts.  Usually Z-Pak and cough syrup makes things better.  Allergic to Percocet but has done fine on codeine and Hycodan before.  No other aggravating or relieving factors.  No other c/o.  ROS as in subjective   Objective: Filed Vitals:   01/02/14 0809  BP: 110/80  Pulse: 78  Temp: 98.7 F (37.1 C)  Resp: 16    General appearance: Alert, WD/WN, no distress                             Skin: warm, no rash                           Head: +frontal sinus tenderness,                            Eyes: conjunctiva normal, corneas clear, PERRLA                            Ears: pearly TMs, external ear canals normal                          Nose: septum midline, turbinates swollen, with erythema and clear discharge             Mouth/throat: MMM, tongue normal, mild pharyngeal erythema                           Neck: supple, no adenopathy, no thyromegaly, nontender                          Heart: RRR, normal S1, S2, no murmurs                         Lungs: bronchial right lower fields, otherwise no wheezes, rales, or rhonchi      Assessment and Plan:  Encounter Diagnoses  Name Primary?  . Acute sinusitis, recurrence not specified, unspecified location Yes  . Sore throat     Prescription given for Zpak, Cheratussin.  Can use OTC  Mucinex for congestion.  Tylenol or Ibuprofen OTC for fever and malaise. Increase albuterol to 2-3 times daily for the next several days. If not seeing improvement in the next few days call recheck.  Discussed symptomatic relief, nasal saline flush, and call or return if worse or not improving in 2-3 days.

## 2014-01-02 NOTE — Patient Instructions (Signed)
  Thank you for giving me the opportunity to serve you today.    Your diagnosis today includes: Encounter Diagnoses  Name Primary?  . Acute sinusitis, recurrence not specified, unspecified location Yes  . Sore throat      Specific recommendations today include:  Begin Z-Pak antibiotic  Drink more water than usual to help clear mucus secretions  You can continue the Alka Selzter for the next 3-5 days  Consider nasal saline flush and saltwater gargles  You may use the Cheratussin for worse cough; caution this may cause sleepiness  Increase your albuterol use to 2-3 times daily for this week, but call or recheck if having worse time breathing  Return if symptoms worsen or fail to improve.

## 2014-01-05 DIAGNOSIS — Z029 Encounter for administrative examinations, unspecified: Secondary | ICD-10-CM

## 2014-02-01 ENCOUNTER — Other Ambulatory Visit: Payer: Self-pay | Admitting: Family Medicine

## 2014-02-01 NOTE — Telephone Encounter (Signed)
Patient scheduled CPE for 05/02/14 @ 1:45pm. Offered appt before that to go over labs, patient declined.

## 2014-02-01 NOTE — Telephone Encounter (Signed)
She had labs done at her October visit, and BP was fine.  Okay to refill meds through March and have her schedule her CPE.  Also, her cholesterol was noted to be pretty high--I advised her of results by MyChart, not sure if she saw.  It was recommended that she schedule OV to review her cholesterol and diet, and possibly discuss meds.  Please off OV prior to the March CPE

## 2014-02-01 NOTE — Telephone Encounter (Signed)
Are these okay to refill? Patient was due for med check (6 mo) ~ 9/15. Due for CPE March 2016. Should I call her and schedule med check and then CPE 6 months later? Please advise.

## 2014-02-05 ENCOUNTER — Telehealth: Payer: Self-pay | Admitting: Family Medicine

## 2014-02-05 DIAGNOSIS — J45909 Unspecified asthma, uncomplicated: Secondary | ICD-10-CM

## 2014-02-05 MED ORDER — ALBUTEROL SULFATE HFA 108 (90 BASE) MCG/ACT IN AERS: 2.0000 | INHALATION_SPRAY | Freq: Four times a day (QID) | RESPIRATORY_TRACT | Status: DC | PRN

## 2014-02-05 NOTE — Telephone Encounter (Signed)
done

## 2014-03-07 ENCOUNTER — Other Ambulatory Visit: Payer: Self-pay | Admitting: Obstetrics and Gynecology

## 2014-03-08 LAB — CYTOLOGY - PAP

## 2014-03-19 ENCOUNTER — Telehealth: Payer: Self-pay | Admitting: Family Medicine

## 2014-03-19 DIAGNOSIS — E7800 Pure hypercholesterolemia, unspecified: Secondary | ICD-10-CM

## 2014-03-19 NOTE — Telephone Encounter (Signed)
Patient called in and wants to know if she can have labs before her cpe.  She will call back in.

## 2014-03-19 NOTE — Telephone Encounter (Signed)
Only needs lipids (had other labs done the end of October). Future order entered.

## 2014-03-22 IMAGING — CT CT ABD-PELV W/ CM
2 of 6 series · 17 of 46 positions shown, 19 images · IV contrast (CONTRAST)
Comparison: DG HIP BILATERAL W/PELVIS dated 03/19/2013;

CLINICAL DATA: Nausea and vomiting. Right lower quadrant abdominal
pain and fever.

EXAM:
CT ABDOMEN AND PELVIS WITH CONTRAST
TECHNIQUE: Multidetector CT imaging of the abdomen and pelvis was performed
using the standard protocol following bolus administration of
intravenous contrast.
CONTRAST:  100mL OMNIPAQUE IOHEXOL 300 MG/ML  SOLN

[Series 2: routine · axial · 0.74mm/px · z∈[-1197,-747]mm · 14 of 103 slices shown, 16 images]
[im 7/103  soft-tissue]
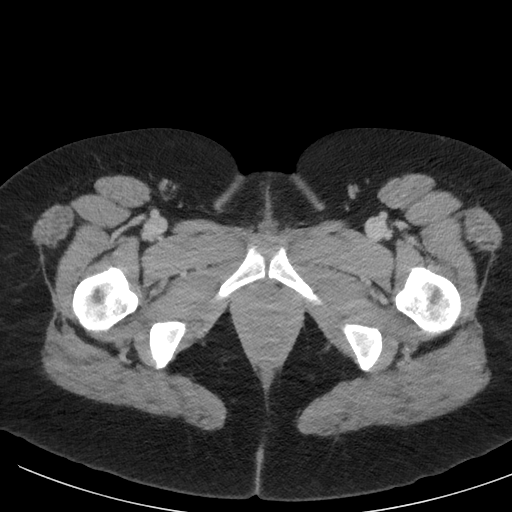
[im 7/103  bone]
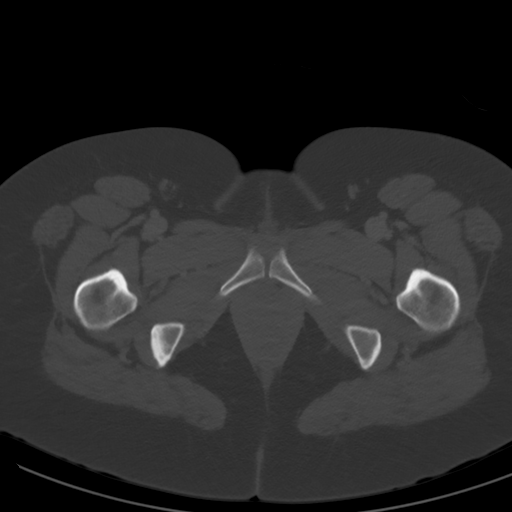
[im 13/103  soft-tissue]
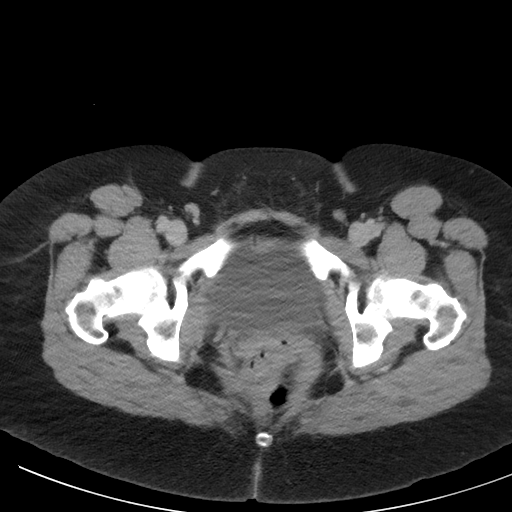
[im 19/103  soft-tissue]
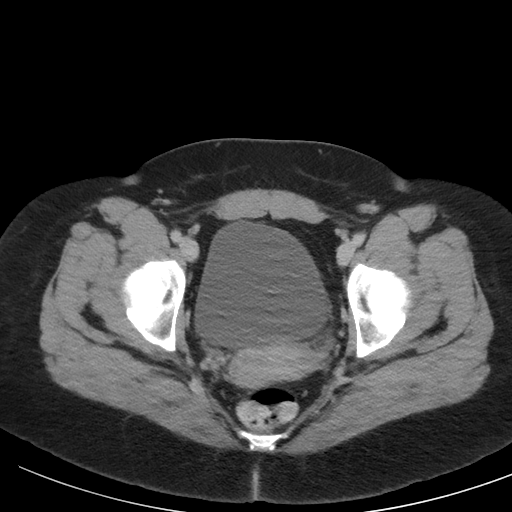
[im 31/103  soft-tissue]
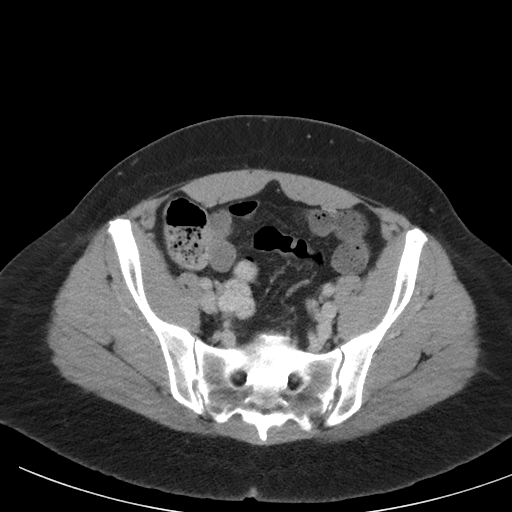
[im 37/103  soft-tissue]
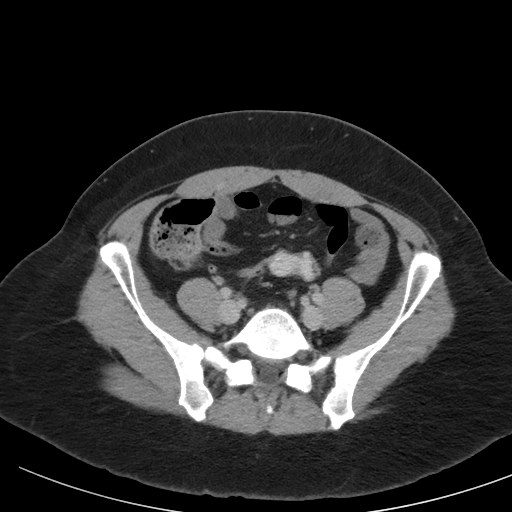
[im 43/103  soft-tissue]
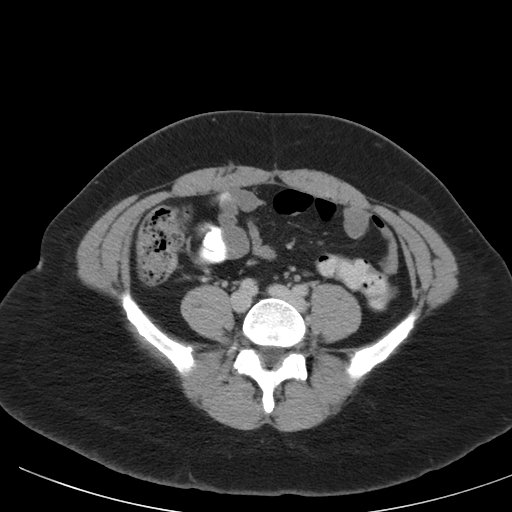
[im 49/103  soft-tissue]
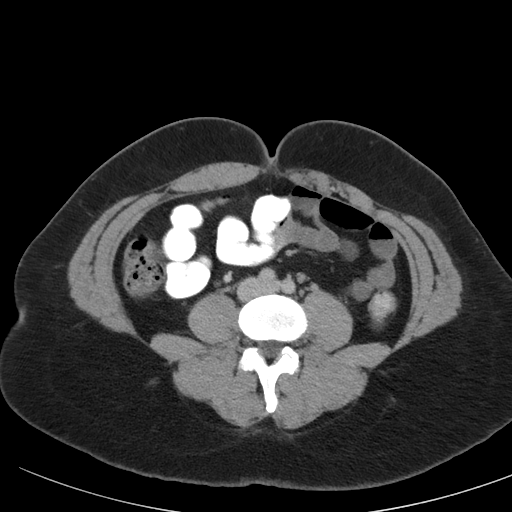
[im 55/103  soft-tissue]
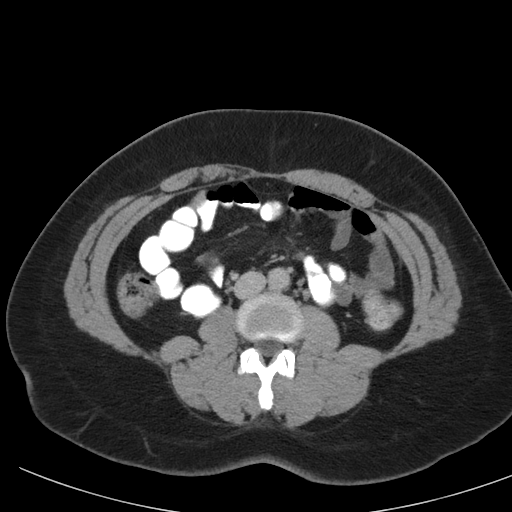
[im 61/103  soft-tissue]
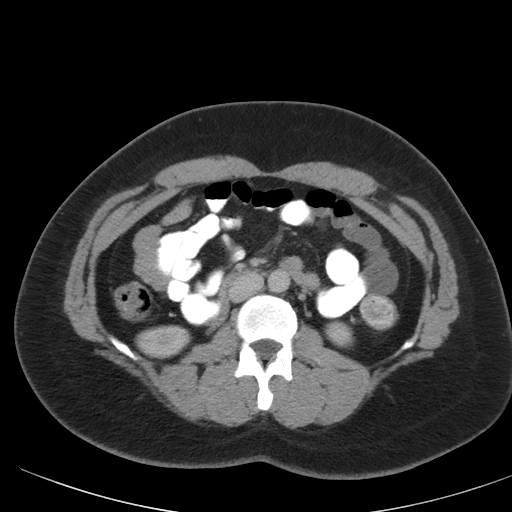
[im 61/103  bone]
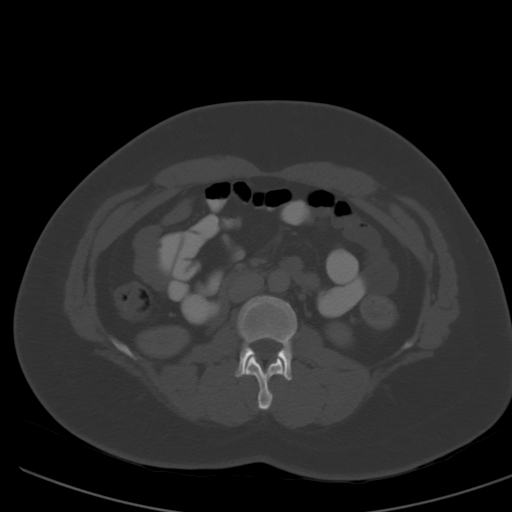
[im 67/103  soft-tissue]
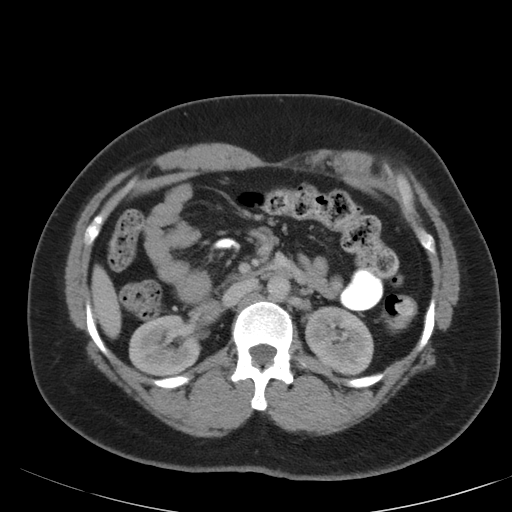
[im 79/103  soft-tissue]
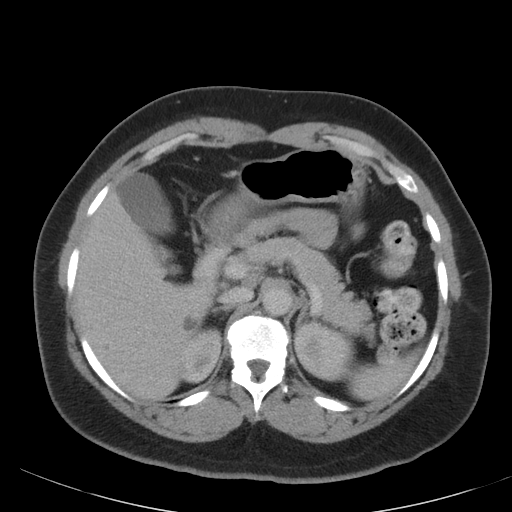
[im 85/103  soft-tissue]
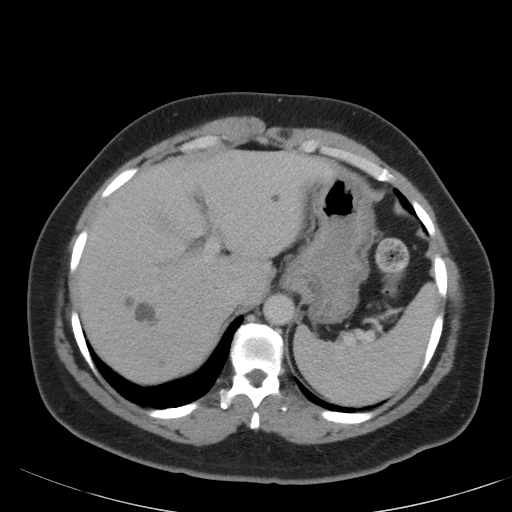
[im 91/103  soft-tissue]
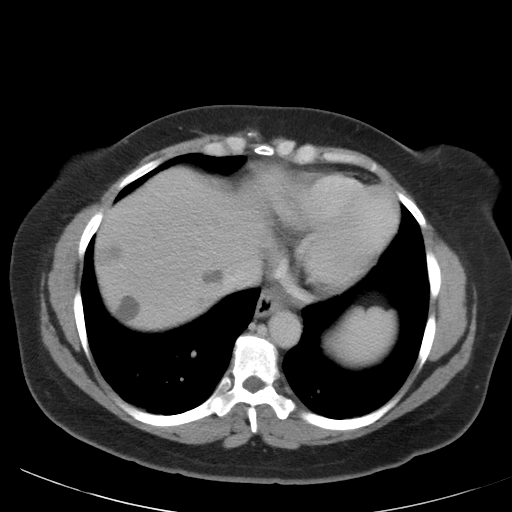
[im 97/103  soft-tissue]
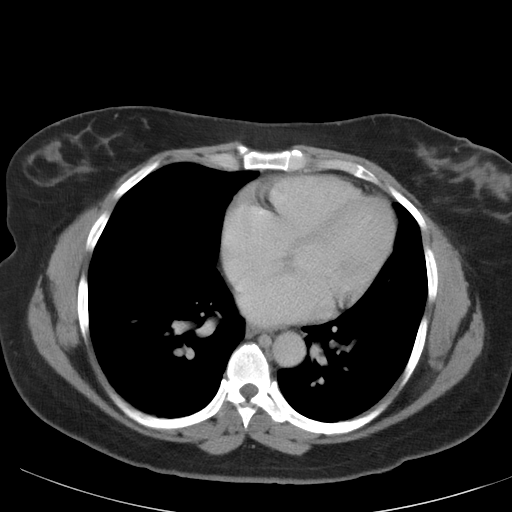

[mpr, coronals, coronal · coronal · 1.00mm/px · 3 of 110 slices shown]
[im 37/110  soft-tissue]
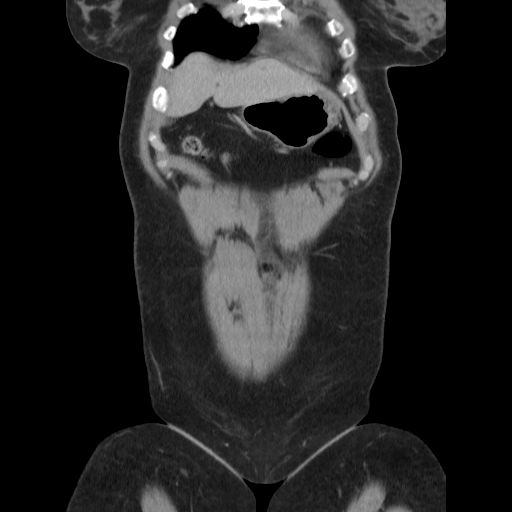
[im 49/110  soft-tissue]
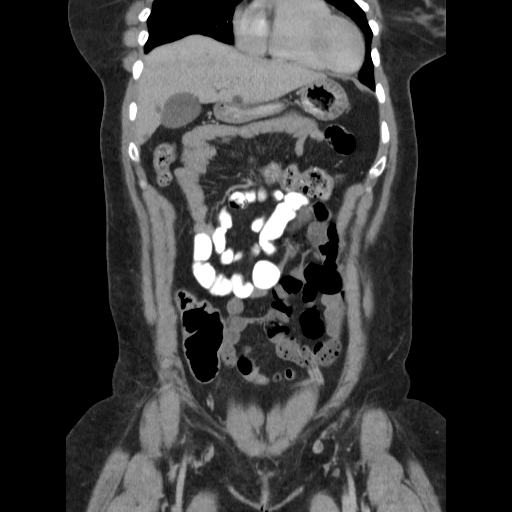
[im 61/110  soft-tissue]
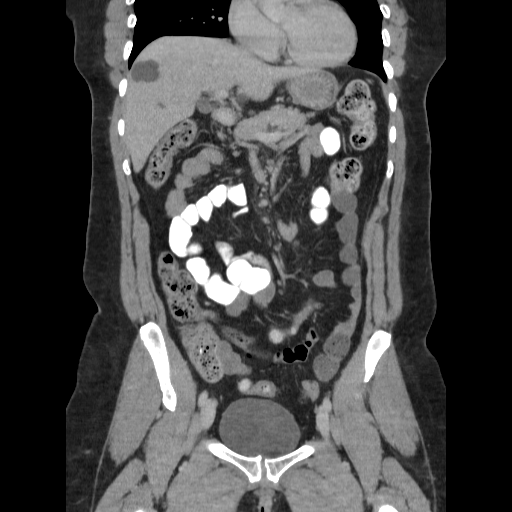

[17 of 46 positions shown; findings below may reference images not displayed]

CT ABD/PELVIS
W CM dated 09/28/2012; US PELVIS COMPLETE dated 03/19/2013; CT ABD/PELV
WO CM dated 05/04/2010
FINDINGS: Scattered hepatic cysts similar in size in distribution to prior
exam. Some of these are bilobed or have thin internal septation.

Spleen, pancreas, adrenal glands, and gallbladder unremarkable.
Small hiatal hernia.

Small peripancreatic lymph nodes are similar to prior. No biliary
dilatation.

No renal calculi or observed abnormal enhancement along the
urothelium. Small retroperitoneal lymph nodes are noted but are not
pathologically enlarged by size criteria.

No pathologic pelvic adenopathy is observed. Appendix normal.
Terminal ileum unremarkable. No right lower quadrant inflammatory
process observed. No significantly dilated small bowel.

Orally administered contrast makes its way through to the distal
colon. No significant adnexal contour abnormality.

There is sclerosis anteriorly in the right femoral head with a small
central lucency as shown on image 26 of series 93223, similar to the
09/28/2012 exam.
IMPRESSION: 1. Appendix normal. No acute findings in the right lower quadrant
explain patient's current symptoms.
2. Stable anterior sclerosis of the right femoral head with a small
central lucency, possibly from avascular necrosis or degenerative
arthropathy.
3. Stable polycystic liver disease.
4. Small hiatal hernia.

## 2014-03-22 IMAGING — US US TRANSVAGINAL NON-OB
1 series · 13 of 25 positions shown · non-contrast
Comparison: None.

CLINICAL DATA: History of uterine ablation, pelvic pain concern for
torsion



[Series 1: us transvaginal non-ob · 0.24mm/px · 13 of 81 slices shown]
[im 1/81]
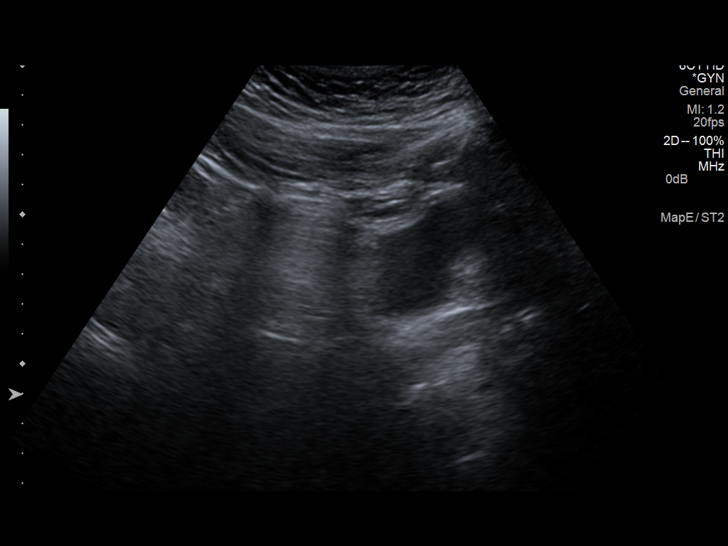
[im 7/81]
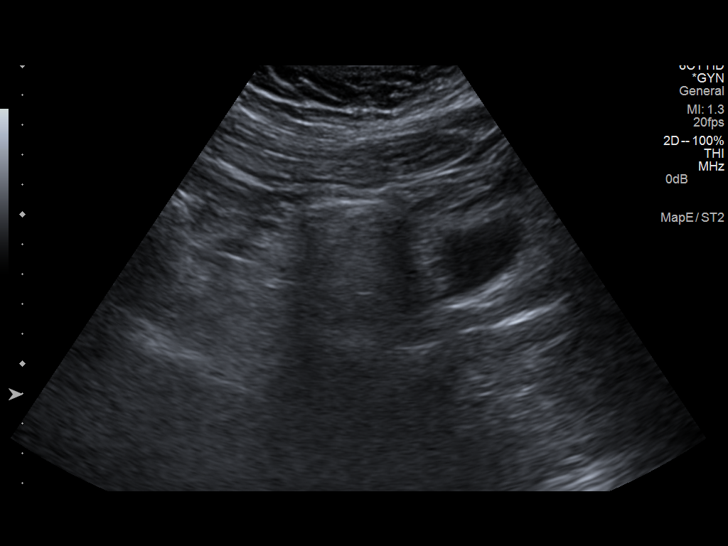
[im 14/81]
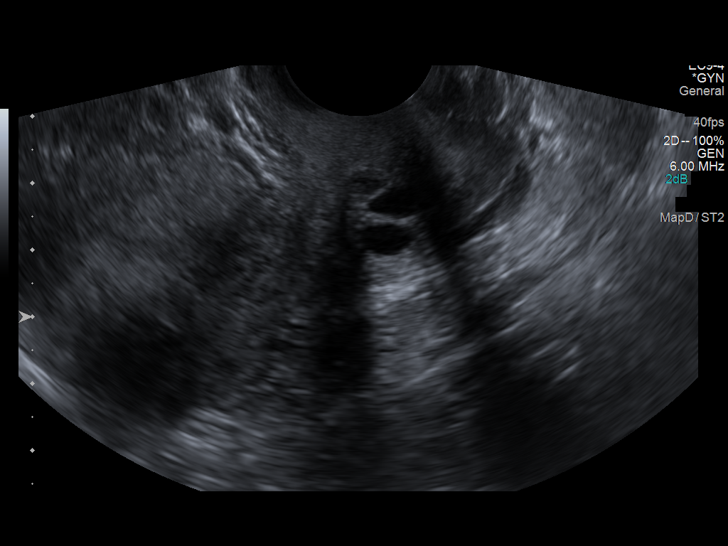
[im 21/81]
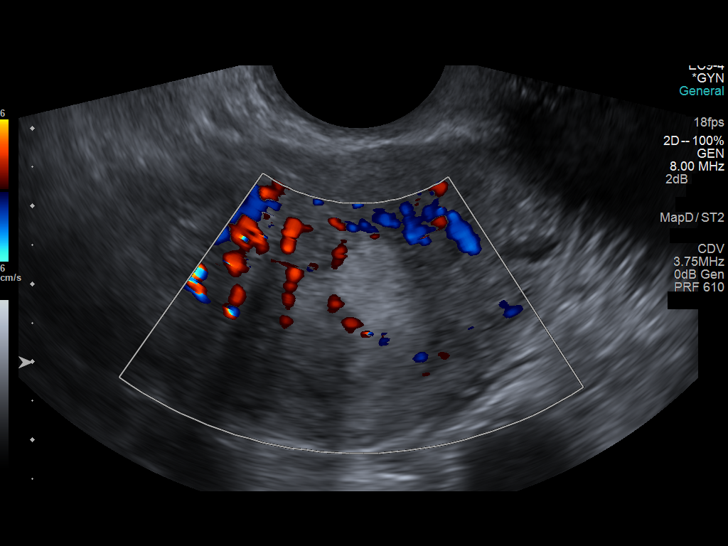
[im 27/81]
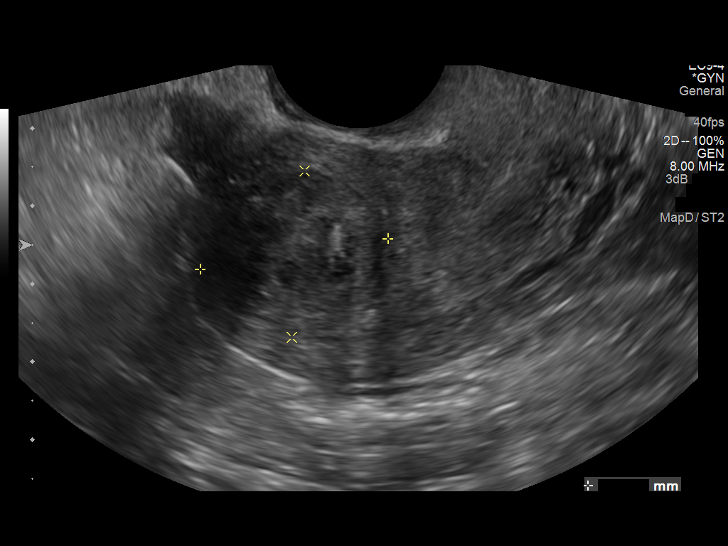
[im 34/81]
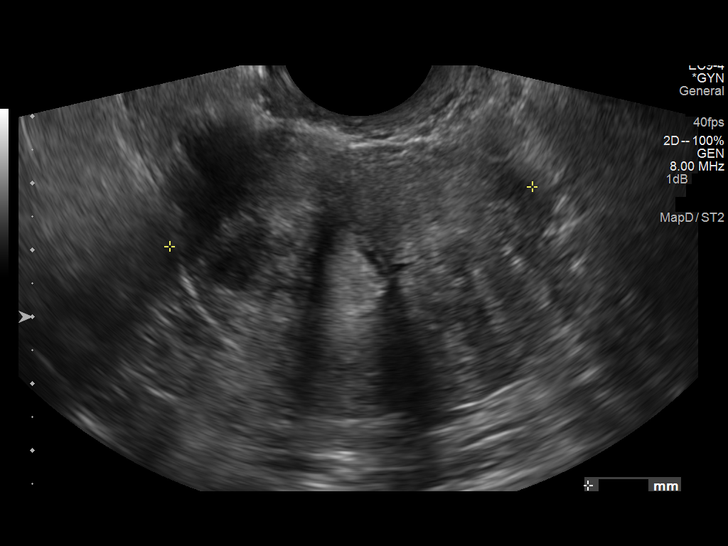
[im 41/81]
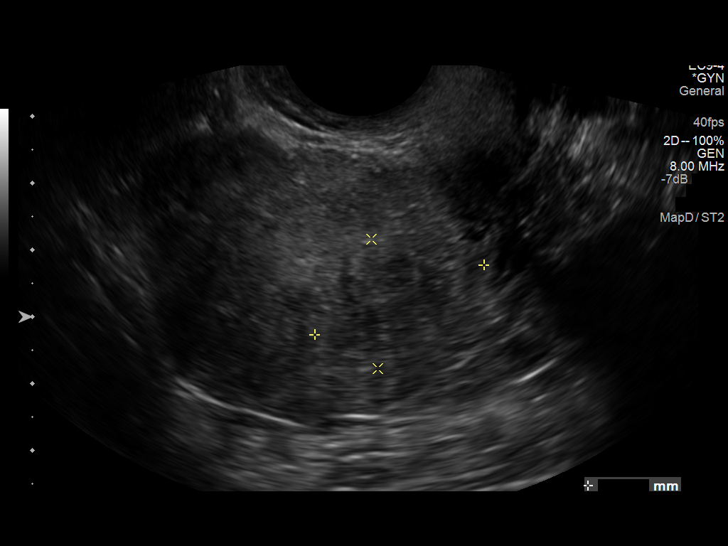
[im 47/81]
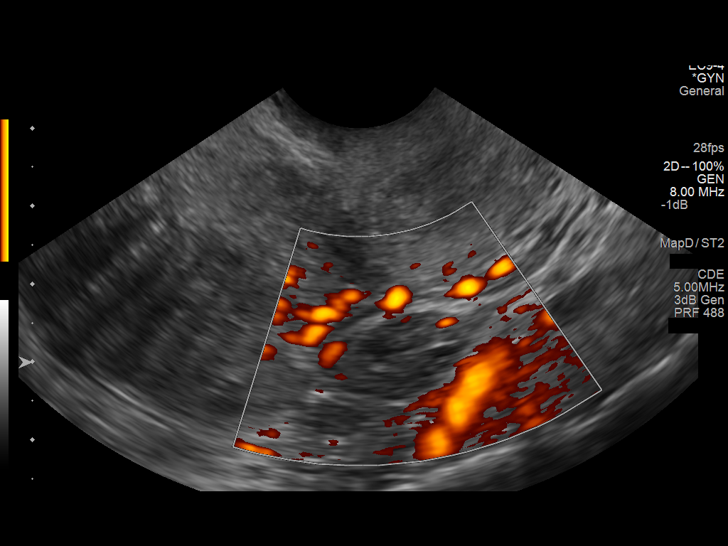
[im 54/81]
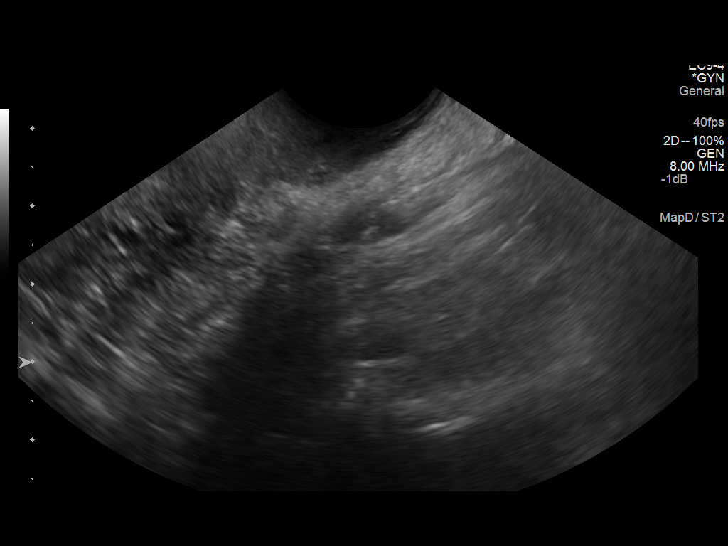
[im 61/81]
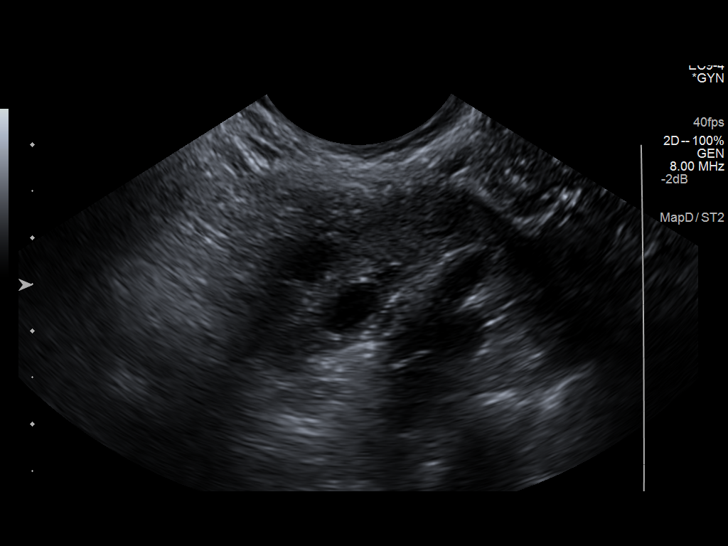
[im 67/81]
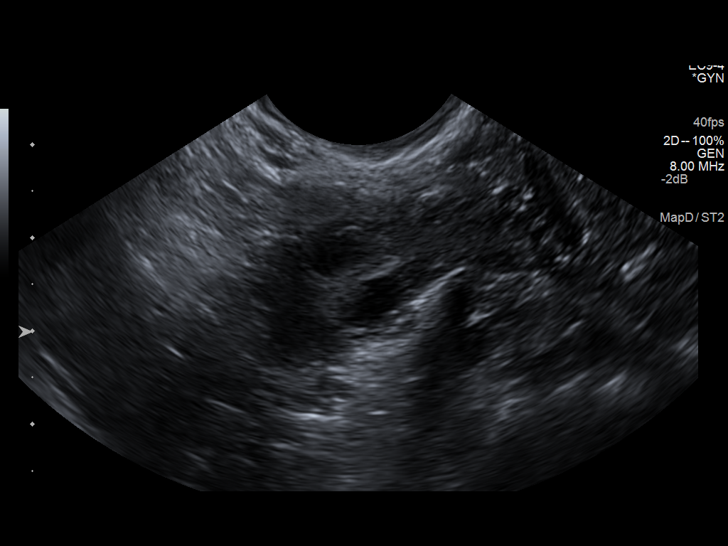
[im 74/81]
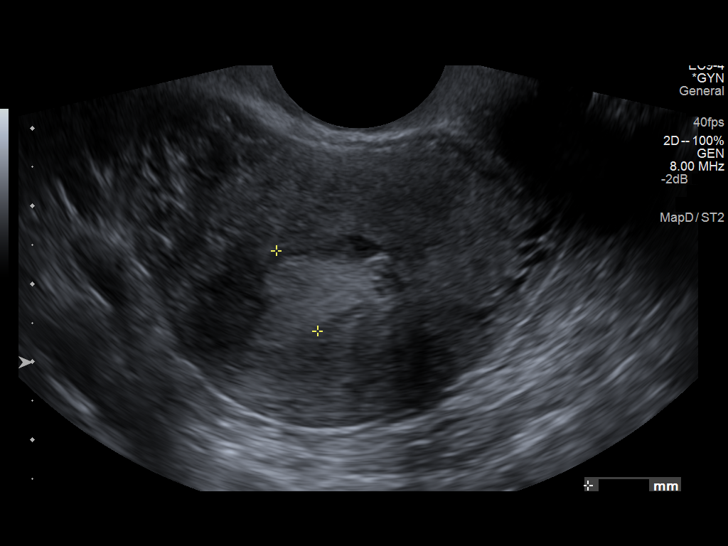
[im 81/81]
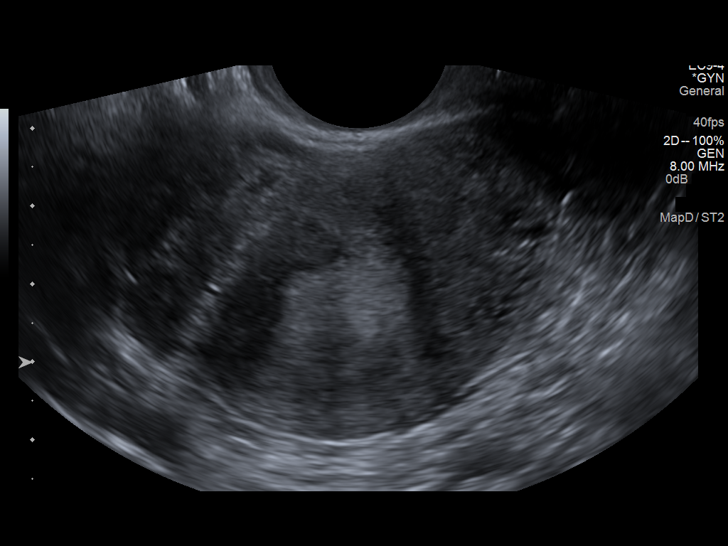

[13 of 25 positions shown; findings below may reference images not displayed]

FINDINGS: Uterus

Measurements: 7.8 x 4.1 x 5.5 cm. The uterus is anteverted and
anteflexed. There is a 24 x 21 x 17 mm mass in the right fundus and
a 27 x 19 x 23 mm mass in the left fundus. These lesions are
essentially isoechoic and mildly heterogeneous.

Endometrium

Thickness: Endometrium shows heterogeneous echotexture and contour
with color Doppler vascularity. The endometrium measures about 10 mm
in diameter..

Right ovary

Measurements: 29 x 15 x 16 mm. Normal appearance/no adnexal mass.

Left ovary

Measurements: 23 x 10 x 15 mm. Normal appearance/no adnexal mass.

Pulsed Doppler evaluation of both ovaries demonstrates normal
low-resistance arterial and venous waveforms.

Other findings

There are a few cervical nabothian cysts.
IMPRESSION: 1. Findings consistent with 2 fibroids with in the uterus.
2. Endometrium is heterogeneous, mildly hypervascular, and
prominent. Differential diagnostic possibilities include endometrial
hyperplasia, polyp, or carcinoma. Consider further evaluation with
sonohysterography.
3. No evidence of ovarian torsion

## 2014-04-09 ENCOUNTER — Other Ambulatory Visit: Payer: Self-pay | Admitting: *Deleted

## 2014-04-09 ENCOUNTER — Telehealth: Payer: Self-pay | Admitting: Family Medicine

## 2014-04-09 MED ORDER — IBUPROFEN 800 MG PO TABS: ORAL_TABLET | ORAL | Status: DC

## 2014-04-09 NOTE — Telephone Encounter (Signed)
Spoke with patient and this is for the hip bursitis B/L-she states she has been going to the gym a lot recently and thinks she may have overdone it.

## 2014-04-09 NOTE — Telephone Encounter (Signed)
This hasn't been prescribed in over a year (prev was for hip pain/bursitis). What is she needing this for?

## 2014-04-09 NOTE — Telephone Encounter (Signed)
Pt called and requested a refill on Ibuprofen 800 mg sent to [facility]. Pt was offered an appt but it was declined. She stated she had one coming up.

## 2014-04-10 ENCOUNTER — Telehealth: Payer: Self-pay | Admitting: Internal Medicine

## 2014-04-10 NOTE — Telephone Encounter (Signed)
Pt needs a refill on her lisinopril-hctz 20-12.5mg  #90 to [facility] [facility]. [place] is going out of country and needs this filled early

## 2014-04-11 ENCOUNTER — Encounter (HOSPITAL_COMMUNITY): Payer: Self-pay | Admitting: *Deleted

## 2014-04-11 ENCOUNTER — Emergency Department (HOSPITAL_COMMUNITY)
Admission: EM | Admit: 2014-04-11 | Discharge: 2014-04-11 | Discharge status: Against Medical Advice | Payer: 59 | Attending: Emergency Medicine | Admitting: Emergency Medicine

## 2014-04-11 DIAGNOSIS — L500 Allergic urticaria: Secondary | ICD-10-CM | POA: Diagnosis not present

## 2014-04-11 DIAGNOSIS — J45909 Unspecified asthma, uncomplicated: Secondary | ICD-10-CM | POA: Diagnosis not present

## 2014-04-11 DIAGNOSIS — I10 Essential (primary) hypertension: Secondary | ICD-10-CM | POA: Diagnosis not present

## 2014-04-11 DIAGNOSIS — T471X5A Adverse effect of other antacids and anti-gastric-secretion drugs, initial encounter: Secondary | ICD-10-CM | POA: Diagnosis not present

## 2014-04-11 DIAGNOSIS — L298 Other pruritus: Secondary | ICD-10-CM | POA: Diagnosis present

## 2014-04-11 MED ORDER — LISINOPRIL-HYDROCHLOROTHIAZIDE 20-12.5 MG PO TABS: 1.0000 | ORAL_TABLET | Freq: Every day | ORAL | Status: DC

## 2014-04-11 NOTE — ED Notes (Signed)
Pt reports that she is going back to her floor and was "tired of waiting for the doctor."  Pt did not sign AMA form but understands harm of leaving without being seen by PA.  [place] notified.

## 2014-04-11 NOTE — ED Notes (Signed)
Pt reports taking a Nexium around 1300 today then around 1345 pt started to have hives and itching all over her body. 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 RN at bedside reports some minor swelling in cheeks and lips. No airway compromise noted. Pt took a benadryl around 1350. Pt presents with hives to her arms, legs, back, chest.

## 2014-04-27 ENCOUNTER — Other Ambulatory Visit: Payer: 59

## 2014-04-27 DIAGNOSIS — E7800 Pure hypercholesterolemia, unspecified: Secondary | ICD-10-CM

## 2014-04-27 LAB — LIPID PANEL
CHOL/HDL RATIO: 4.3 ratio
CHOLESTEROL: 182 mg/dL (ref 0–200)
HDL: 42 mg/dL — AB (ref >=46)
LDL Cholesterol: 124 mg/dL — ABNORMAL HIGH (ref 0–99)
Triglycerides: 79 mg/dL (ref <150)
VLDL: 16 mg/dL (ref 0–40)

## 2014-05-02 ENCOUNTER — Encounter: Payer: Self-pay | Admitting: Family Medicine

## 2014-05-02 ENCOUNTER — Ambulatory Visit (INDEPENDENT_AMBULATORY_CARE_PROVIDER_SITE_OTHER): Payer: 59 | Admitting: Family Medicine

## 2014-05-02 VITALS — BP 110/72 | HR 72 | Ht 68.0 in | Wt 231.6 lb

## 2014-05-02 DIAGNOSIS — E559 Vitamin D deficiency, unspecified: Secondary | ICD-10-CM | POA: Diagnosis not present

## 2014-05-02 DIAGNOSIS — E669 Obesity, unspecified: Secondary | ICD-10-CM | POA: Diagnosis not present

## 2014-05-02 DIAGNOSIS — E782 Mixed hyperlipidemia: Secondary | ICD-10-CM | POA: Diagnosis not present

## 2014-05-02 DIAGNOSIS — J309 Allergic rhinitis, unspecified: Secondary | ICD-10-CM

## 2014-05-02 DIAGNOSIS — J45909 Unspecified asthma, uncomplicated: Secondary | ICD-10-CM

## 2014-05-02 DIAGNOSIS — I10 Essential (primary) hypertension: Secondary | ICD-10-CM | POA: Diagnosis not present

## 2014-05-02 DIAGNOSIS — Z0000 Encounter for general adult medical examination without abnormal findings: Secondary | ICD-10-CM | POA: Diagnosis not present

## 2014-05-02 LAB — POCT URINALYSIS DIPSTICK
BILIRUBIN UA: NEGATIVE
Blood, UA: NEGATIVE
GLUCOSE UA: NEGATIVE
Ketones, UA: NEGATIVE
Leukocytes, UA: NEGATIVE
Nitrite, UA: NEGATIVE
Protein, UA: NEGATIVE
SPEC GRAV UA: 1.015
Urobilinogen, UA: NEGATIVE
pH, UA: 6

## 2014-05-02 MED ORDER — MONTELUKAST SODIUM 10 MG PO TABS: 10.0000 mg | ORAL_TABLET | Freq: Every day | ORAL | Status: DC

## 2014-05-02 MED ORDER — LISINOPRIL-HYDROCHLOROTHIAZIDE 20-12.5 MG PO TABS: 1.0000 | ORAL_TABLET | Freq: Every day | ORAL | Status: DC

## 2014-05-02 NOTE — Progress Notes (Signed)
Chief Complaint  Patient presents with  . Annual Exam    nonfasting annual exam no pap, sees Dr.Lowe and is UTD. Labs already done. No concerns.    Candace Walker is a 44 y.o. female who presents for a complete physical.  She has the following concerns:  Hyperlipidemia:  She cut down on her fats and carbs in her diet, eating more greens and natural foods. She makes homemade dressings.  She has never taken medications for cholesterol, just taking fish oil 3x/day.  Hypertension follow-up:  Blood pressures elsewhere are 110-121/62-74.  Denies dizziness, headaches, chest pain.  Denies side effects of medications.  Allergies and asthma: Doing well on her current regimen of Singulair, Allegra and nasal saline spray. She needed albuterol during an illness, otherwise does not need rescue medication (hasn't had any incidences of asthma flares at work related to flowers, perfume).  Bilateral hip pain.  H/o bursitis.  Flared last month when she started exercising, and used 800 mg ibuprofen. She took it for 5 days, and hip pain has resolved.  Pain was laterally, down to the mid-lateral thigh bilaterally.  She was seen the end of October with complaints of fatigue.  Labs were done and were normal. Sleep study was recommended.  She doesn't want to have one done.  She states that her energy has improved.  Immunization History  Administered Date(s) Administered  . Influenza Split 10/15/2010, 11/19/2011, 10/31/2013  . Influenza Whole 11/12/2012  . Pneumococcal Polysaccharide-23 04/24/2013  . Tdap 04/24/2013   Last Pap smear:  02/2014, normal (Dr. Corinna Capra, GYN) Last mammogram: 03/2014 Last colonoscopy: never Last DEXA: never Dentist: twice yearly Ophtho: Yearly Exercise: 3-4 days/week at [employer] (stair climber, elliptical, stretches, weights)  Past Medical History  Diagnosis Date  . Allergic rhinitis, cause unspecified   . Essential hypertension, benign   . Mixed hyperlipidemia     no  meds, diet controlled  . Unspecified vitamin D deficiency   . Positive PPD     h/o BCG vaccine, negative chest xray  . SVD (spontaneous vaginal delivery)     x 2  . Asthma with allergic rhinitis     rarely uses inhaler unless she gets sick  . 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 Tubal ligation    . Endometrial ablation  2007    Dr. Corinna Capra  . Wisdom tooth extraction      right bottom lower tooth  . Laparoscopic assisted vaginal hysterectomy N/A 06/12/2013    Procedure: LAPAROSCOPIC ASSISTED VAGINAL HYSTERECTOMY;  Surgeon: Luz Lex, MD;  Location: [facility];  Service: Gynecology;  Laterality: N/A;  . Laparoscopic bilateral salpingectomy  06/12/2013    Procedure: LAPAROSCOPIC BILATERAL SALPINGECTOMY;  Surgeon: Luz Lex, MD;  Location: [facility];  Service: Gynecology;;    History   Social History  . Marital Status: Divorced    Spouse Name: N/A  . Number of Children: 2  . Years of Education: N/A   Occupational History  . Nurse Dent   Social History Main Topics  . Smoking status: Never Smoker   . Smokeless tobacco: Never Used  . Alcohol Use: Yes     Comment: occasional (once a month)  . Drug Use: No  . Sexual Activity: Not Currently    Birth Control/ Protection: Surgical     Comment: ablation and tubal ligation   Other Topics  Concern  . Not on file   Social History Narrative   Lives with her son; no pets. Divorced from her husband.  Daughter is in the army, living in [place]    Family History  Problem Relation Age of Onset  . Diabetes Mother   . Hypertension Mother   . Allergies Mother   . Heart disease Father 18    MI  . Hyperlipidemia Father   . Hypertension Father   . Cancer Paternal Grandmother     breast cancer in 66's    Outpatient Encounter Prescriptions as of 05/02/2014  Medication Sig Note  . albuterol (PROVENTIL HFA;VENTOLIN HFA) 108 (90  BASE) MCG/ACT inhaler Inhale 2 puffs into the lungs every 6 (six) hours as needed for wheezing. (Patient not taking: Reported on 05/02/2014) 05/02/2014: Uses prn--hasn't needed since she was sick  . cholecalciferol (VITAMIN D) 1000 UNITS tablet Take 1,000 Units by mouth daily.     . fexofenadine (ALLEGRA) 180 MG tablet Take 180 mg by mouth at bedtime.    . fish oil-omega-3 fatty acids 1000 MG capsule Take 3 g by mouth daily.    Marland Kitchen ibuprofen (ADVIL,MOTRIN) 800 MG tablet TAKE 1 TABLET BY MOUTH EVERY 8 (EIGHT) HOURS AS NEEDED. USE FOR 1-2 WEEKS AS NEEDED FOR HIP BURSITIS/PAIN (Patient not taking: Reported on 05/02/2014) 05/02/2014: Uses prn hip pain (not needing currently)  . lisinopril-hydrochlorothiazide (PRINZIDE,ZESTORETIC) 20-12.5 MG per tablet TAKE 1 TABLET BY MOUTH ONCE DAILY   . montelukast (SINGULAIR) 10 MG tablet TAKE 1 TABLET BY MOUTH AT BEDTIME   . Multiple Vitamins-Minerals (MULTIVITAMIN WITH MINERALS) tablet Take 1 tablet by mouth daily.     . [DISCONTINUED] azithromycin (ZITHROMAX) 250 MG tablet 2 tablets day 1, then 1 tablet days 2-4   . [DISCONTINUED] guaiFENesin-codeine (CHERATUSSIN AC) 100-10 MG/5ML syrup Take 5 mLs by mouth 3 (three) times daily as needed for cough.   . [DISCONTINUED] lisinopril-hydrochlorothiazide (PRINZIDE,ZESTORETIC) 20-12.5 MG per tablet Take 1 tablet by mouth daily.    Allergies  Allergen Reactions  . Nexium [Esomeprazole Magnesium] Itching  . Penicillins Hives  . Percocet [Oxycodone-Acetaminophen] Itching    ROS: The patient denies anorexia, fever, headaches, vision changes, decreased hearing, ear pain, sore throat, breast concerns, chest pain, palpitations, dizziness, syncope, dyspnea on exertion, cough, swelling, nausea, vomiting, diarrhea, constipation, abdominal pain, melena, hematochezia, hematuria, incontinence, dysuria, vaginal bleeding (s/p hysterectomy), discharge, odor or itch, genital lesions, numbness, tingling, weakness, tremor, suspicious skin  lesions, depression, anxiety, abnormal bleeding/bruising, or enlarged lymph nodes.  She has lost 12# since the end of October 2015. Allergies are controlled; breathing is good. Currently not having any hip or back pain. Heartburn only with certain foods (tomato sauce)  PHYSICAL EXAM:  BP 110/72 mmHg  Pulse 72  Ht _0  (1.727 m)  Wt 231 lb 9.6 oz (105.053 kg)  BMI 35.22 kg/m2  LMP 11/07/2012  General Appearance:   Alert, cooperative, no distress, appears stated age  Head:   Normocephalic, without obvious abnormality, atraumatic  Eyes:   PERRL, conjunctiva/corneas clear, EOM's intact, fundi   benign  Ears:   Normal TM's and external ear canals  Nose:  Nares normal, mucosa is mildly edematous, pale, no drainage or sinus tenderness  Throat:  Lips, mucosa, and tongue normal; teeth and gums normal  Neck:  Supple, no lymphadenopathy; thyroid: no enlargement/tenderness/nodules; no carotid  bruit or JVD  Back:  Spine nontender, no curvature, ROM normal, no CVA tenderness  Lungs:   Clear to auscultation bilaterally without wheezes, rales  or ronchi; respirations unlabored  Chest Wall:   No tenderness or deformity  Heart:   Regular rate and rhythm, S1 and S2 normal, no murmur, rub  or gallop  Breast Exam:   Deferred to GYN  Abdomen:   Soft, non-tender, nondistended, normoactive bowel sounds,   no masses, no hepatosplenomegaly  Genitalia:   Deferred to GYN     Extremities:  No clubbing, cyanosis or edema. nontender at hips  Pulses:  2+ and symmetric all extremities  Skin:  Skin color, texture, turgor normal, no rashes or lesions  Lymph nodes:  Cervical, supraclavicular, and axillary nodes normal  Neurologic:  CNII-XII intact, normal strength, sensation and gait; reflexes 2+ and symmetric throughout   Psych: Normal mood, affect, hygiene and grooming        Lab Results   Component Value Date   CHOL 182 04/27/2014   CHOL 236* 12/11/2013   CHOL 208* 02/03/2013   Lab Results  Component Value Date   HDL 42* 04/27/2014   HDL 45 12/11/2013   HDL 42 02/03/2013   Lab Results  Component Value Date   LDLCALC 124* 04/27/2014   LDLCALC 163* 12/11/2013   LDLCALC 149* 02/03/2013   Lab Results  Component Value Date   TRIG 79 04/27/2014   TRIG 140 12/11/2013   TRIG 87 02/03/2013   Lab Results  Component Value Date   CHOLHDL 4.3 04/27/2014   CHOLHDL 5.2 12/11/2013   CHOLHDL 5.0 02/03/2013   No results found for: LDLDIRECT    Chemistry      Component Value Date/Time   NA 136 12/11/2013 0907   K 4.2 12/11/2013 0907   CL 103 12/11/2013 0907   CO2 20 12/11/2013 0907   BUN 17 12/11/2013 0907   CREATININE 1.07 12/11/2013 0907   CREATININE 1.07 06/07/2013 1350      Component Value Date/Time   CALCIUM 9.5 12/11/2013 0907   ALKPHOS 57 12/11/2013 0907   AST 22 12/11/2013 0907   ALT 15 12/11/2013 0907   BILITOT 0.4 12/11/2013 0907     Lab Results  Component Value Date   WBC 4.1 12/11/2013   HGB 13.0 12/11/2013   HCT 38.4 12/11/2013   MCV 85.3 12/11/2013   PLT 180 12/11/2013   Lab Results  Component Value Date   TSH 1.017 12/11/2013   Vitamin D-OH 37 (12/11/13)--normal  ASSESSMENT/PLAN:  Annual physical exam - Plan: POCT Urinalysis Dipstick  Vitamin D deficiency - adequately replaced per last check in October. Continue supplements  Mixed hyperlipidemia - significantly improved.  continue lowfat, low cholesterol diet, exercise, fish oil  Essential hypertension, benign - controlled - Plan: lisinopril-hydrochlorothiazide (PRINZIDE,ZESTORETIC) 20-12.5 MG per tablet  Asthma with allergic rhinitis, unspecified asthma severity, uncomplicated - stable; only flares with illness or certain exposures at work which she tries to avoid - Plan: montelukast (SINGULAIR) 10 MG tablet  Allergic rhinitis, unspecified allergic rhinitis type - controlled  on current regimen  Obesity, Class II, BMI 35-39.9 - continued weight loss encouraged  Discussed monthly self breast exams and yearly mammograms after the age of 45; at least 30 minutes of aerobic activity at least 5 days/week, weight-bearing exercise at least 2x/week; proper sunscreen use reviewed; healthy diet, including goals of calcium and vitamin D intake and alcohol recommendations (less than or equal to 1 drink/day) reviewed; regular seatbelt use; changing batteries in smoke detectors. Immunization recommendations discussed--UTD. Continue yearly flu shots. Colonoscopy recommendations reviewed, age 32   F/u 6 months with labs prior (c-met, lipid)

## 2014-05-02 NOTE — Patient Instructions (Signed)

## 2014-05-18 ENCOUNTER — Encounter: Payer: Self-pay | Admitting: Medical

## 2014-05-18 ENCOUNTER — Ambulatory Visit (INDEPENDENT_AMBULATORY_CARE_PROVIDER_SITE_OTHER): Payer: 59 | Admitting: Medical

## 2014-05-18 VITALS — BP 120/80 | HR 78 | Temp 98.5°F | Resp 15 | Wt 240.0 lb

## 2014-05-18 DIAGNOSIS — J0110 Acute frontal sinusitis, unspecified: Secondary | ICD-10-CM | POA: Diagnosis not present

## 2014-05-18 DIAGNOSIS — R05 Cough: Secondary | ICD-10-CM

## 2014-05-18 DIAGNOSIS — R059 Cough, unspecified: Secondary | ICD-10-CM

## 2014-05-18 MED ORDER — AZITHROMYCIN 250 MG PO TABS: ORAL_TABLET | ORAL | Status: DC

## 2014-05-18 MED ORDER — GUAIFENESIN-CODEINE 100-10 MG/5ML PO SYRP: 5.0000 mL | ORAL_SOLUTION | Freq: Three times a day (TID) | ORAL | Status: DC | PRN

## 2014-05-18 NOTE — Progress Notes (Signed)
Subjective:  Candace Walker is a 44 y.o. female who presents for illness. Just got back from vacation from [place].  Has had over 10 day hx/o sinus pressure, cough, hoarseness, stuffy head, sore throat, nausea. Denies fever,  vomiting, ear pain, wheezing or shortness of breath.  Past history is significant for asthma. Get sinus infections 1-2 times per year typically.  Patient is a non-smoker.  Using Tylenol, nasal saline, daily Singulair, Zyrtec for symptoms. Denies sick contacts.  Usually Z-Pak and cough syrup makes things better.  Allergic to Percocet but has done fine on codeine and Hycodan before.  No other aggravating or relieving factors.  No other c/o.  ROS as in subjective   Objective: Filed Vitals:   05/18/14 1445  BP: 120/80  Pulse: 78  Temp: 98.5 F (36.9 C)  Resp: 15    General appearance: Alert, WD/WN, no distress, hoarse voice                             Skin: warm, no rash                           Head: +frontal sinus tenderness,                            Eyes: conjunctiva normal, corneas clear, PERRLA                            Ears: pearly TMs, external ear canals normal                          Nose: septum midline, turbinates swollen, with erythema and clear discharge             Mouth/throat: MMM, tongue normal, mild pharyngeal erythema                           Neck: supple, no adenopathy, no thyromegaly, nontender                         Lungs: Clear, no wheezes, rales, or rhonchi      Assessment and Plan:  Encounter Diagnoses  Name Primary?  . Acute frontal sinusitis, recurrence not specified Yes  . Cough     Prescription given for Zpak, Cheratussin.  Continue the current allergy regimen.  Tylenol or Ibuprofen OTC for fever and malaise.  Rest, advised good hydration, call return if not improving within the next several days

## 2014-05-21 ENCOUNTER — Ambulatory Visit: Payer: 59 | Admitting: Family Medicine

## 2014-05-25 ENCOUNTER — Telehealth: Payer: Self-pay | Admitting: Internal Medicine

## 2014-05-25 DIAGNOSIS — Z0279 Encounter for issue of other medical certificate: Secondary | ICD-10-CM

## 2014-05-25 NOTE — Telephone Encounter (Signed)
Called pt and left voicemail to let her know that [employer] forms are ready and it will be a $25.00 form charge. Once we collect the money we can then fax or give them to pt.

## 2014-09-28 ENCOUNTER — Ambulatory Visit: Payer: 59 | Admitting: Podiatry

## 2014-10-02 ENCOUNTER — Ambulatory Visit: Payer: 59 | Admitting: Podiatry

## 2014-10-29 ENCOUNTER — Other Ambulatory Visit: Payer: 59

## 2014-10-30 ENCOUNTER — Other Ambulatory Visit: Payer: 59

## 2014-11-02 ENCOUNTER — Other Ambulatory Visit: Payer: 59

## 2014-11-02 DIAGNOSIS — I10 Essential (primary) hypertension: Secondary | ICD-10-CM

## 2014-11-02 DIAGNOSIS — E782 Mixed hyperlipidemia: Secondary | ICD-10-CM

## 2014-11-02 LAB — LIPID PANEL
CHOL/HDL RATIO: 4.9 ratio (ref <=5.0)
Cholesterol: 224 mg/dL — ABNORMAL HIGH (ref 125–200)
HDL: 46 mg/dL (ref >=46)
LDL CALC: 151 mg/dL — AB (ref <130)
TRIGLYCERIDES: 134 mg/dL (ref <150)
VLDL: 27 mg/dL (ref <30)

## 2014-11-02 LAB — COMPREHENSIVE METABOLIC PANEL
ALBUMIN: 3.7 g/dL (ref 3.6–5.1)
ALT: 13 U/L (ref 6–29)
AST: 21 U/L (ref 10–30)
Alkaline Phosphatase: 50 U/L (ref 33–115)
BILIRUBIN TOTAL: 0.6 mg/dL (ref 0.2–1.2)
BUN: 12 mg/dL (ref 7–25)
CO2: 25 mmol/L (ref 20–31)
Calcium: 8.7 mg/dL (ref 8.6–10.2)
Chloride: 103 mmol/L (ref 98–110)
Creat: 0.93 mg/dL (ref 0.50–1.10)
Glucose, Bld: 78 mg/dL (ref 65–99)
POTASSIUM: 4 mmol/L (ref 3.5–5.3)
Sodium: 138 mmol/L (ref 135–146)
Total Protein: 6.8 g/dL (ref 6.1–8.1)

## 2014-11-07 ENCOUNTER — Ambulatory Visit (INDEPENDENT_AMBULATORY_CARE_PROVIDER_SITE_OTHER): Payer: 59 | Admitting: Family Medicine

## 2014-11-07 ENCOUNTER — Encounter: Payer: Self-pay | Admitting: Family Medicine

## 2014-11-07 VITALS — BP 122/82 | HR 66 | Resp 12 | Ht 68.0 in | Wt 229.8 lb

## 2014-11-07 DIAGNOSIS — J45909 Unspecified asthma, uncomplicated: Secondary | ICD-10-CM

## 2014-11-07 DIAGNOSIS — E669 Obesity, unspecified: Secondary | ICD-10-CM

## 2014-11-07 DIAGNOSIS — J309 Allergic rhinitis, unspecified: Secondary | ICD-10-CM

## 2014-11-07 DIAGNOSIS — E782 Mixed hyperlipidemia: Secondary | ICD-10-CM

## 2014-11-07 DIAGNOSIS — I10 Essential (primary) hypertension: Secondary | ICD-10-CM | POA: Diagnosis not present

## 2014-11-07 DIAGNOSIS — Z0000 Encounter for general adult medical examination without abnormal findings: Secondary | ICD-10-CM | POA: Diagnosis not present

## 2014-11-07 NOTE — Patient Instructions (Addendum)
Your blood pressure is well controlled. Continue your regular exercise and efforts at weight loss.  If your allergies get worse, despite daily montelukast and allegra, then you might need to add in a daily inhaled nasal steroid such as Flonase or Nasacort.  This are OTC, but might possibly be cheaper by prescription.  Your cholesterol jumped up a lot, likely related to your food choices while on vacation--let's get back to the diet you were following back in the Spring (cut back on egg yolks, sausage, cheese, creamy sauces, red meats, etc). Goal LDL is <130.  Continue the fish oil. Plan to recheck in 6 months.   Fat and Cholesterol Control Diet Fat and cholesterol levels in your blood and organs are influenced by your diet. High levels of fat and cholesterol may lead to diseases of the heart, small and large blood vessels, gallbladder, liver, and pancreas. CONTROLLING FAT AND CHOLESTEROL WITH DIET Although exercise and lifestyle factors are important, your diet is key. That is because certain foods are known to raise cholesterol and others to lower it. The goal is to balance foods for their effect on cholesterol and more importantly, to replace saturated and trans fat with other types of fat, such as monounsaturated fat, polyunsaturated fat, and omega-3 fatty acids. On average, a person should consume no more than 15 to 17 g of saturated fat daily. Saturated and trans fats are considered "bad" fats, and they will raise LDL cholesterol. Saturated fats are primarily found in animal products such as meats, butter, and cream. However, that does not mean you need to give up all your favorite foods. Today, there are good tasting, low-fat, low-cholesterol substitutes for most of the things you like to eat. Choose low-fat or nonfat alternatives. Choose round or loin cuts of red meat. These types of cuts are lowest in fat and cholesterol. Chicken (without the skin), fish, veal, and ground [place] breast are  great choices. Eliminate fatty meats, such as hot dogs and salami. Even shellfish have little or no saturated fat. Have a 3 oz (85 g) portion when you eat lean meat, poultry, or fish. Trans fats are also called "partially hydrogenated oils." They are oils that have been scientifically manipulated so that they are solid at room temperature resulting in a longer shelf life and improved taste and texture of foods in which they are added. Trans fats are found in stick margarine, some tub margarines, cookies, crackers, and baked goods.  When baking and cooking, oils are a great substitute for butter. The monounsaturated oils are especially beneficial since it is believed they lower LDL and raise HDL. The oils you should avoid entirely are saturated tropical oils, such as coconut and palm.  Remember to eat a lot from food groups that are naturally free of saturated and trans fat, including fish, fruit, vegetables, beans, grains (barley, rice, couscous, bulgur wheat), and pasta (without cream sauces).  IDENTIFYING FOODS THAT LOWER FAT AND CHOLESTEROL  Soluble fiber may lower your cholesterol. This type of fiber is found in fruits such as apples, vegetables such as broccoli, potatoes, and carrots, legumes such as beans, peas, and lentils, and grains such as barley. Foods fortified with plant sterols (phytosterol) may also lower cholesterol. You should eat at least 2 g per day of these foods for a cholesterol lowering effect.  Read package labels to identify low-saturated fats, trans fat free, and low-fat foods at the supermarket. Select cheeses that have only 2 to 3 g saturated fat per ounce.  Use a heart-healthy tub margarine that is free of trans fats or partially hydrogenated oil. When buying baked goods (cookies, crackers), avoid partially hydrogenated oils. Breads and muffins should be made from whole grains (whole-wheat or whole oat flour, instead of "flour" or "enriched flour"). Buy non-creamy canned soups with  reduced salt and no added fats.  FOOD PREPARATION TECHNIQUES  Never deep-fry. If you must fry, either stir-fry, which uses very little fat, or use non-stick cooking sprays. When possible, broil, bake, or roast meats, and steam vegetables. Instead of putting butter or margarine on vegetables, use lemon and herbs, applesauce, and cinnamon (for squash and sweet potatoes). Use nonfat yogurt, salsa, and low-fat dressings for salads.  LOW-SATURATED FAT / LOW-FAT FOOD SUBSTITUTES Meats / Saturated Fat (g)  Avoid: Steak, marbled (3 oz/85 g) / 11 g  Choose: Steak, lean (3 oz/85 g) / 4 g  Avoid: Hamburger (3 oz/85 g) / 7 g  Choose: Hamburger, lean (3 oz/85 g) / 5 g  Avoid: Ham (3 oz/85 g) / 6 g  Choose: Ham, lean cut (3 oz/85 g) / 2.4 g  Avoid: Chicken, with skin, dark meat (3 oz/85 g) / 4 g  Choose: Chicken, skin removed, dark meat (3 oz/85 g) / 2 g  Avoid: Chicken, with skin, light meat (3 oz/85 g) / 2.5 g  Choose: Chicken, skin removed, light meat (3 oz/85 g) / 1 g Dairy / Saturated Fat (g)  Avoid: Whole milk (1 cup) / 5 g  Choose: Low-fat milk, 2% (1 cup) / 3 g  Choose: Low-fat milk, 1% (1 cup) / 1.5 g  Choose: Skim milk (1 cup) / 0.3 g  Avoid: Hard cheese (1 oz/28 g) / 6 g  Choose: Skim milk cheese (1 oz/28 g) / 2 to 3 g  Avoid: Cottage cheese, 4% fat (1 cup) / 6.5 g  Choose: Low-fat cottage cheese, 1% fat (1 cup) / 1.5 g  Avoid: Ice cream (1 cup) / 9 g  Choose: Sherbet (1 cup) / 2.5 g  Choose: Nonfat frozen yogurt (1 cup) / 0.3 g  Choose: Frozen fruit bar / trace  Avoid: Whipped cream (1 tbs) / 3.5 g  Choose: Nondairy whipped topping (1 tbs) / 1 g Condiments / Saturated Fat (g)  Avoid: Mayonnaise (1 tbs) / 2 g  Choose: Low-fat mayonnaise (1 tbs) / 1 g  Avoid: Butter (1 tbs) / 7 g  Choose: Extra light margarine (1 tbs) / 1 g  Avoid: Coconut oil (1 tbs) / 11.8 g  Choose: Olive oil (1 tbs) / 1.8 g  Choose: Corn oil (1 tbs) / 1.7 g  Choose: Safflower oil  (1 tbs) / 1.2 g  Choose: Sunflower oil (1 tbs) / 1.4 g  Choose: Soybean oil (1 tbs) / 2.4 g  Choose: Canola oil (1 tbs) / 1 g Document Released: 02/02/2005 Document Revised: 05/30/2012 Document Reviewed: 05/03/2013 ExitCare Patient Information 2015 [place], [place]. This information is not intended to replace advice given to you by your health care provider. Make sure you discuss any questions you have with your health care provider.

## 2014-11-07 NOTE — Progress Notes (Signed)
Chief Complaint  Patient presents with  . med check    already had flu shot this season   Hyperlipidemia:  She was home in the [place] recently, and admits that her diet has been bad--more butter, cakes, hard boiled eggs, sausages.  "I was on vacation!" Typically her diet is better.  She has never taken cholesterol medication  Hypertension follow-up: Blood pressures elsewhere are 110-121/66-74. Denies dizziness, headaches, chest pain, muscle cramps or edema. Denies side effects of medications. She is exercising at the gym for at least an hour 3-4 days/week.  Allergies and asthma: Doing well on her current regimen of Singulair, Allegra and nasal saline spray. She needed albuterol 2 weeks ago when allergies were flaring, not often (hasn't had any incidences of asthma flares at work related to flowers, perfume).  PMH, PSH, SH and FH were reviewed/updated  Outpatient Encounter Prescriptions as of 11/07/2014  Medication Sig Note  . cholecalciferol (VITAMIN D) 1000 UNITS tablet Take 1,000 Units by mouth daily.     . fexofenadine (ALLEGRA) 180 MG tablet Take 180 mg by mouth at bedtime.    . fish oil-omega-3 fatty acids 1000 MG capsule Take 3 g by mouth daily.    Marland Kitchen lisinopril-hydrochlorothiazide (PRINZIDE,ZESTORETIC) 20-12.5 MG per tablet Take 1 tablet by mouth daily.   . montelukast (SINGULAIR) 10 MG tablet Take 1 tablet (10 mg total) by mouth at bedtime.   Marland Kitchen albuterol (PROVENTIL HFA;VENTOLIN HFA) 108 (90 BASE) MCG/ACT inhaler Inhale 2 puffs into the lungs every 6 (six) hours as needed for wheezing. (Patient not taking: Reported on 11/07/2014) 11/07/2014: Uses prn, last used 2 weeks ago with her allergies  . ibuprofen (ADVIL,MOTRIN) 800 MG tablet TAKE 1 TABLET BY MOUTH EVERY 8 (EIGHT) HOURS AS NEEDED. USE FOR 1-2 WEEKS AS NEEDED FOR HIP BURSITIS/PAIN (Patient not taking: Reported on 11/07/2014) 05/02/2014: Uses prn hip pain (not needing currently)  . [DISCONTINUED] azithromycin (ZITHROMAX) 250 MG  tablet 2 tablets day 1, then 1 tablet days 2-4   . [DISCONTINUED] guaiFENesin-codeine (CHERATUSSIN AC) 100-10 MG/5ML syrup Take 5 mLs by mouth 3 (three) times daily as needed for cough.   . [DISCONTINUED] Multiple Vitamins-Minerals (MULTIVITAMIN WITH MINERALS) tablet Take 1 tablet by mouth daily.      No facility-administered encounter medications on file as of 11/07/2014.   Allergies  Allergen Reactions  . Nexium [Esomeprazole Magnesium] Itching  . Penicillins Hives  . Percocet [Oxycodone-Acetaminophen] Itching    ROS:  No fevers, chills, headaches, dizziness, chest pain, heartburt, nausea, vomiting, bowel changes, bleeding, bruising, rashes. No palpitations. No urinary complaints. No depression, anxiety, insomnia.  Right hip intermittently flares with pain (from bursitis), uses advil prn.  Hasn't been too bad lately.  No other joint pains.  PHYSICAL EXAM: BP 122/82 mmHg  Pulse 66  Resp 12  Ht 5' 8"  (1.727 m)  Wt 229 lb 12.8 oz (104.237 kg)  BMI 34.95 kg/m2  LMP 11/07/2012 Well developed, pleasant, obese female in no distress HEENT: PERRL, EOMI, conjunctiva clear. TM's and EAC's normal. Nasal mucosa normal, no purulence or erythema. Sinuses nontender. OP clear Neck: no lymphadenopathy or thyromegaly Heart: regular rate and rhythm without murmur Lungs: clear bilaterally with normal air movement; no wheezes, rales, ronchi Abdomen: soft, nontender, no mass Extremities: no edema, 2+ pulses Skin: no rashes or lesions Psych: normal mood, affect, hygiene and grooming Neuro: alert and oriented. Cranial nerves intact. Normal strength, gait.     Chemistry      Component Value Date/Time   NA 138  11/02/2014 0001   K 4.0 11/02/2014 0001   CL 103 11/02/2014 0001   CO2 25 11/02/2014 0001   BUN 12 11/02/2014 0001   CREATININE 0.93 11/02/2014 0001   CREATININE 1.07 06/07/2013 1350      Component Value Date/Time   CALCIUM 8.7 11/02/2014 0001   ALKPHOS 50 11/02/2014 0001   AST 21  11/02/2014 0001   ALT 13 11/02/2014 0001   BILITOT 0.6 11/02/2014 0001     Fasting glucose 78  Lab Results  Component Value Date   CHOL 224* 11/02/2014   HDL 46 11/02/2014   LDLCALC 151* 11/02/2014   TRIG 134 11/02/2014   CHOLHDL 4.9 11/02/2014   ASSESSMENT/PLAN:  Mixed hyperlipidemia - LDL and chol/HDL ratio above goal. Reviewed diet, exercise. Recheck in 6 mos  Essential hypertension, benign - well controlled  Asthma with allergic rhinitis, unspecified asthma severity, uncomplicated - stable; do PFT's at f/u in 6 mos. Continue singulair, allegra  Obesity (BMI 30-39.9) - reviewed diet, exercise, portions. weight loss encouraged  Allergic rhinitis, unspecified allergic rhinitis type - continue singulair, Allegra; start Flonase if allergies not adequately controlled with this regimen   Reviewed low cholesterol diet and goals in detail.  Add flonase if allergies suboptimally controlled with allegra and singulair (can call for rx if less expensive).  Do PFT's at next visit in 6 mos  F/u 6 mos with fasting labs prior--lipid, c-met, TSH (normal vitamin D, CBC 11/2013)

## 2014-12-19 ENCOUNTER — Telehealth: Payer: Self-pay | Admitting: Family Medicine

## 2014-12-19 NOTE — Telephone Encounter (Signed)
Recv'd FMLA faxed put in your folder

## 2015-02-27 MED FILL — MONTELUKAST SOD 10 MG TAB: 10 | 90 days supply | Qty: 90 | Fill #3

## 2015-02-27 MED FILL — LISINOPRIL-HCTZ 20-12.5 MG: 20-12.5 | 90 days supply | Qty: 90 | Fill #2

## 2015-03-08 ENCOUNTER — Encounter: Payer: Self-pay | Admitting: Medical

## 2015-03-08 ENCOUNTER — Ambulatory Visit (INDEPENDENT_AMBULATORY_CARE_PROVIDER_SITE_OTHER): Payer: 59 | Admitting: Medical

## 2015-03-08 VITALS — BP 110/78 | HR 75 | Temp 98.3°F | Resp 16 | Wt 233.0 lb

## 2015-03-08 DIAGNOSIS — J3489 Other specified disorders of nose and nasal sinuses: Secondary | ICD-10-CM

## 2015-03-08 DIAGNOSIS — R51 Headache: Secondary | ICD-10-CM | POA: Diagnosis not present

## 2015-03-08 DIAGNOSIS — R519 Headache, unspecified: Secondary | ICD-10-CM

## 2015-03-08 DIAGNOSIS — R52 Pain, unspecified: Secondary | ICD-10-CM | POA: Diagnosis not present

## 2015-03-08 LAB — POC INFLUENZA A&B (BINAX/QUICKVUE)
Influenza A, POC: NEGATIVE
Influenza B, POC: NEGATIVE

## 2015-03-08 MED ORDER — LEVOFLOXACIN 500 MG PO TABS: 500.0000 mg | ORAL_TABLET | Freq: Every day | ORAL | Status: DC

## 2015-03-08 MED FILL — levoFLOXacin 500 MG TABS: 500 | 7 days supply | Qty: 7 | Fill #0

## 2015-03-08 NOTE — Progress Notes (Signed)
Subjective:  Candace Walker is a 45 y.o. female who presents for illness. Has several day hx/o sinus pressure, headache, nasal congestion, coughing at night.  Denies fever, but has had some chills and sweats.   Has had some body aches.  Had similar symptoms over a week ago, seemed to be better for a few days, then worse like this again.   Using Alka Seltzer sinus and cold, saline nasal.    No sick contacts.   No nausea or vomiting.  Past history is significant for asthma. Get sinus infections 1-2 times per year typically.  Patient is a non-smoker. She did get flu shot this year.  No other aggravating or relieving factors.  No other c/o.  ROS as in subjective   Objective: Filed Vitals:   03/08/15 1338  BP: 110/78  Pulse: 75  Temp: 98.3 F (36.8 C)  Resp: 16    General appearance: Alert, WD/WN, no distress, mildly ill appearing                             Skin: warm, no rash                           Head: +frontal sinus tenderness,                            Eyes: conjunctiva normal, corneas clear, PERRLA                            Ears: pearly TMs, external ear canals normal                          Nose: septum midline, turbinates swollen, with erythema and clear discharge             Mouth/throat: MMM, tongue normal, mild pharyngeal erythema                           Neck: supple, no adenopathy, no thyromegaly, nontender                         Lungs: Clear, no wheezes, rales, or rhonchi Heart: RRR,, normal S1, S2, no murmurs       Assessment and Plan:  Encounter Diagnoses  Name Primary?  . Sinus pressure Yes  . Body aches   . Generalized headaches     Prescription given for Levaquin.  Continue the current OTC remedies.  Tylenol or Ibuprofen OTC for fever and malaise.  Rest, advised good hydration, call return if not improving within the next several days

## 2015-03-21 ENCOUNTER — Telehealth: Payer: Self-pay | Admitting: Family Medicine

## 2015-03-21 MED ORDER — IBUPROFEN 800 MG PO TABS: ORAL_TABLET | ORAL | Status: DC

## 2015-03-21 MED FILL — IBUPROFEN 800 MG TABLET: 800 | 15 days supply | Qty: 45 | Fill #0

## 2015-03-21 NOTE — Telephone Encounter (Signed)
Is this okay?

## 2015-03-21 NOTE — Telephone Encounter (Signed)
Last filled a year ago.  [place] for refill.  Done. Please advise pt done.

## 2015-03-21 NOTE — Telephone Encounter (Signed)
Pt wants to know if she can have some Ibuprofen called into [facility] for her Bursitis of her right hip

## 2015-03-21 NOTE — Telephone Encounter (Signed)
Patient advised.

## 2015-05-17 ENCOUNTER — Other Ambulatory Visit: Payer: 59

## 2015-05-20 ENCOUNTER — Encounter: Payer: 59 | Admitting: Family Medicine

## 2015-06-07 ENCOUNTER — Other Ambulatory Visit: Payer: Self-pay | Admitting: Family Medicine

## 2015-06-07 MED FILL — MONTELUKAST SOD 10 MG TAB: 10 | 90 days supply | Qty: 90 | Fill #0

## 2015-06-07 MED FILL — LISINOPRIL-HCTZ 20-12.5 MG: 20-12.5 | 90 days supply | Qty: 90 | Fill #0

## 2015-07-08 ENCOUNTER — Telehealth: Payer: Self-pay | Admitting: *Deleted

## 2015-07-08 NOTE — Telephone Encounter (Signed)
I spoke with patient and she was concerned that she was told her appt was "canceled" when it was really "rescheduled," and she was having to pay the form fee because she canceled. I explained that her CPE/Med chk/PFT's were rescheduled, yes, but due to the fact that she needs the forms filled out prior to the Nov visit that we needed to see her sooner for appropriate documentation (she is coming in 6/5). I explained that if she needed Dr.Knapp to fill out the forms prior to the 6/5 visit and fax that was the reason for the charge since Dr.Knapp would have to take the time to do these, if she could wait until the actual appt on 6/5 there would not be a charge. Patient said she was okay waiting on the forms until visit-I will hold these at my desk until visit.

## 2015-07-08 NOTE — Telephone Encounter (Signed)
Patient called and asked if you could please finish her FMLA paperwork and fax back prior to her newly scheduled med check as she has a time limit and she will be past it with her appts-has labs scheduled for 5/23 and med check for 6/5. I put the papers on your shelf and filled out what I could.

## 2015-07-08 NOTE — Telephone Encounter (Signed)
FFO and given to [place]. Since this is not done at the time of her med check, needs to be charged for a form fee.  Please advise patient of charge (and let V know when okay to fax).  (when FMLA forms done at visit, no form fees; she canceled 2 visits, and now needs forms prior to the next scheduled visit, so fee needs to be assessed).

## 2015-07-08 NOTE — Telephone Encounter (Signed)
Spoke to pt and she stated that she did not have the money today. She states that she doesn't think she should have to pay fee prior to being sent. She states she did not cancel appt she rescheduled. I informed pt that she could discuss with Dr. Tomi Bamberger at her next appt.

## 2015-07-08 NOTE — Telephone Encounter (Signed)
(  issue was with paying today, prior to being sent, not that she wouldn't pay form at all...)

## 2015-07-09 ENCOUNTER — Other Ambulatory Visit: Payer: 59

## 2015-07-09 DIAGNOSIS — E669 Obesity, unspecified: Secondary | ICD-10-CM | POA: Diagnosis not present

## 2015-07-09 DIAGNOSIS — I10 Essential (primary) hypertension: Secondary | ICD-10-CM | POA: Diagnosis not present

## 2015-07-09 DIAGNOSIS — Z0000 Encounter for general adult medical examination without abnormal findings: Secondary | ICD-10-CM

## 2015-07-09 DIAGNOSIS — E782 Mixed hyperlipidemia: Secondary | ICD-10-CM | POA: Diagnosis not present

## 2015-07-09 LAB — COMPREHENSIVE METABOLIC PANEL
ALBUMIN: 3.6 g/dL (ref 3.6–5.1)
ALK PHOS: 49 U/L (ref 33–115)
ALT: 14 U/L (ref 6–29)
AST: 19 U/L (ref 10–35)
BUN: 9 mg/dL (ref 7–25)
CHLORIDE: 103 mmol/L (ref 98–110)
CO2: 24 mmol/L (ref 20–31)
Calcium: 9.1 mg/dL (ref 8.6–10.2)
Creat: 0.85 mg/dL (ref 0.50–1.10)
Glucose, Bld: 83 mg/dL (ref 65–99)
POTASSIUM: 3.8 mmol/L (ref 3.5–5.3)
Sodium: 136 mmol/L (ref 135–146)
TOTAL PROTEIN: 6.7 g/dL (ref 6.1–8.1)
Total Bilirubin: 0.5 mg/dL (ref 0.2–1.2)

## 2015-07-09 LAB — LIPID PANEL
CHOLESTEROL: 203 mg/dL — AB (ref 125–200)
HDL: 47 mg/dL (ref >=46)
LDL CALC: 129 mg/dL (ref <130)
TRIGLYCERIDES: 133 mg/dL (ref <150)
Total CHOL/HDL Ratio: 4.3 Ratio (ref <=5.0)
VLDL: 27 mg/dL (ref <30)

## 2015-07-10 LAB — TSH: TSH: 0.62 mIU/L

## 2015-07-22 ENCOUNTER — Encounter: Payer: Self-pay | Admitting: Family Medicine

## 2015-07-22 ENCOUNTER — Ambulatory Visit (INDEPENDENT_AMBULATORY_CARE_PROVIDER_SITE_OTHER): Payer: 59 | Admitting: Family Medicine

## 2015-07-22 VITALS — BP 120/80 | HR 80 | Ht 68.0 in | Wt 235.2 lb

## 2015-07-22 DIAGNOSIS — I10 Essential (primary) hypertension: Secondary | ICD-10-CM

## 2015-07-22 DIAGNOSIS — J45909 Unspecified asthma, uncomplicated: Secondary | ICD-10-CM

## 2015-07-22 DIAGNOSIS — E782 Mixed hyperlipidemia: Secondary | ICD-10-CM | POA: Diagnosis not present

## 2015-07-22 DIAGNOSIS — M7070 Other bursitis of hip, unspecified hip: Secondary | ICD-10-CM

## 2015-07-22 DIAGNOSIS — E669 Obesity, unspecified: Secondary | ICD-10-CM | POA: Diagnosis not present

## 2015-07-22 MED ORDER — IBUPROFEN 800 MG PO TABS: ORAL_TABLET | ORAL | Status: DC

## 2015-07-22 MED ORDER — ALBUTEROL SULFATE HFA 108 (90 BASE) MCG/ACT IN AERS: 2.0000 | INHALATION_SPRAY | Freq: Four times a day (QID) | RESPIRATORY_TRACT | Status: DC | PRN

## 2015-07-22 MED FILL — IBUPROFEN 800 MG TABLET: 800 | 20 days supply | Qty: 60 | Fill #0

## 2015-07-22 NOTE — Patient Instructions (Signed)
Continue your current medications. Restart the vitamin D and omega-3 fish oil. Continue regular exercise, but work on diet as well to try and further lose weight.  Take ibuprofen 800mg  three times daily with food for 10 days (up to 2 weeks, if needed).  If your hip bursitis isn't improving, return for a cortisone injection  Hip Bursitis Bursitis is a swelling and soreness (inflammation) of a fluid-filled sac (bursa). This sac overlies and protects the joints.  CAUSES   Injury.  Overuse of the muscles surrounding the joint.  Arthritis.  Gout.  Infection.  Cold weather.  Inadequate warm-up and conditioning prior to activities. The cause may not be known.  SYMPTOMS   Mild to severe irritation.  Tenderness and swelling over the outside of the hip.  Pain with motion of the hip.  If the bursa becomes infected, a fever may be present. Redness, tenderness, and warmth will develop over the hip. Symptoms usually lessen in 3 to 4 weeks with treatment, but can come back. TREATMENT If conservative treatment does not work, your caregiver may advise draining the bursa and injecting cortisone into the area. This may speed up the healing process. This may also be used as an initial treatment of choice. HOME CARE INSTRUCTIONS   Apply ice to the affected area for 15-20 minutes every 3 to 4 hours while awake for the first 2 days. Put the ice in a plastic bag and place a towel between the bag of ice and your skin.  Rest the painful joint as much as possible, but continue to put the joint through a normal range of motion at least 4 times per day. When the pain lessens, begin normal, slow movements and usual activities to help prevent stiffness of the hip.  Only take over-the-counter or prescription medicines for pain, discomfort, or fever as directed by your caregiver.  Use crutches to limit weight bearing on the hip joint, if advised.  Elevate your painful hip to reduce swelling. Use pillows  for propping and cushioning your legs and hips.  Gentle massage may provide comfort and decrease swelling. SEEK IMMEDIATE MEDICAL CARE IF:   Your pain increases even during treatment, or you are not improving.  You have a fever.  You have heat and inflammation over the involved bursa.  You have any other questions or concerns. MAKE SURE YOU:   Understand these instructions.  Will watch your condition.  Will get help right away if you are not doing well or get worse.   This information is not intended to replace advice given to you by your health care provider. Make sure you discuss any questions you have with your health care provider.   Document Released: 07/25/2001 Document Revised: 04/27/2011 Document Reviewed: 09/04/2014 Elsevier Interactive Patient Education [employer].

## 2015-07-22 NOTE — Progress Notes (Signed)
Chief Complaint  Patient presents with  . Hypertension    fasting med check, labs already done.    Hypertension follow-up: Blood pressures elsewhere are 120's/70's at work. Denies dizziness, headaches, chest pain, muscle cramps or edema. Denies side effects of medications. She is exercising at the gym for at least an hour 3 days/week.  Allergies and asthma: Doing well on her current regimen of Singulair, Allegra and nasal saline spray.FMLA forms need to be completed today--she only was seen once for acute illness (1/20). She had a flare last month related to a patient's flowers, that required her to use her albuterol inhaler. She was able to remain at work after a 30 minute break.    Hyperlipidemia: This has been diet controlled, she has never taken cholesterol medication.  She is requesting ibuprofen prescription for her right hip pain.  It hurts to sleep on at night. She has some pain on the left as well. She takes ibuprofen prior to bedtime, using OTC, but requesting rx supply--has previously taken without side effects.  PMH, PSH, SH reviewed  Outpatient Encounter Prescriptions as of 07/22/2015  Medication Sig Note  . fexofenadine (ALLEGRA) 180 MG tablet Take 180 mg by mouth at bedtime. Reported on 07/22/2015   . lisinopril-hydrochlorothiazide (PRINZIDE,ZESTORETIC) 20-12.5 MG tablet TAKE 1 TABLET BY MOUTH DAILY.   . montelukast (SINGULAIR) 10 MG tablet TAKE 1 TABLET (10 MG TOTAL) BY MOUTH AT BEDTIME.   Marland Kitchen albuterol (PROVENTIL HFA;VENTOLIN HFA) 108 (90 BASE) MCG/ACT inhaler Inhale 2 puffs into the lungs every 6 (six) hours as needed for wheezing. (Patient not taking: Reported on 11/07/2014) 07/22/2015: Needed to use at work over a month ago (flare up at work, related to flowers)  . cholecalciferol (VITAMIN D) 1000 UNITS tablet Take 1,000 Units by mouth daily. Reported on 07/22/2015 07/22/2015: Ran out a month ago  . fish oil-omega-3 fatty acids 1000 MG capsule Take 3 g by mouth daily. Reported on  07/22/2015 07/22/2015: Hasn't taken in a month, ran out  . ibuprofen (ADVIL,MOTRIN) 800 MG tablet TAKE 1 TABLET BY MOUTH EVERY 8 (EIGHT) HOURS AS NEEDED. USE FOR 1-2 WEEKS AS NEEDED FOR HIP BURSITIS/PAIN (Patient not taking: Reported on 07/22/2015)   . [DISCONTINUED] levofloxacin (LEVAQUIN) 500 MG tablet Take 1 tablet (500 mg total) by mouth daily.    No facility-administered encounter medications on file as of 07/22/2015.   Has been taking OTC ibuprofen since out of rx.  Allergies  Allergen Reactions  . Nexium [Esomeprazole Magnesium] Itching  . Penicillins Hives  . Percocet [Oxycodone-Acetaminophen] Itching    ROS:  No fever, chills, headaches, dizziness, chest pain, palpitations, nausea, vomiting, diarrhea, anxiety, depression. Allergies are controlled with current regimen.  See HPI. Occasional heartburn at night, relieved by Tums.  PHYSICAL EXAM: BP 120/80 mmHg  Pulse 80  Ht 5\' 8"  (1.727 m)  Wt 235 lb 3.2 oz (106.686 kg)  BMI 35.77 kg/m2  LMP 11/07/2012  Well developed, pleasant female in no distress HEENT: PERRL, EOMI, conjunctiva and sclera are clear.  Nasal mucosa is normal. OP is clear. Neck: No lymphadenopathy, thyromegaly or carotid bruit Heart: regular rate and rhythm Lungs: clear bilaterally Abdomen: soft, nontender, no organomegaly or mass Extremities: no edema, normal pulses. Tender L>R trochanteric bursa.  Some discomfort with IT band stretch Psych: normal mood, affect, hygiene and grooming Neuro: alert and oriented, cranial nerves intact, normal strength, gait  Lab Results  Component Value Date   CHOL 203* 07/09/2015   HDL 47 07/09/2015   LDLCALC  129 07/09/2015   TRIG 133 07/09/2015   CHOLHDL 4.3 07/09/2015   Lab Results  Component Value Date   TSH 0.62 07/09/2015     Chemistry      Component Value Date/Time   NA 136 07/09/2015 1010   K 3.8 07/09/2015 1010   CL 103 07/09/2015 1010   CO2 24 07/09/2015 1010   BUN 9 07/09/2015 1010   CREATININE 0.85  07/09/2015 1010   CREATININE 1.07 06/07/2013 1350      Component Value Date/Time   CALCIUM 9.1 07/09/2015 1010   ALKPHOS 49 07/09/2015 1010   AST 19 07/09/2015 1010   ALT 14 07/09/2015 1010   BILITOT 0.5 07/09/2015 1010     ASSESSMENT/PLAN:  Essential hypertension, benign - well controlled  Mixed hyperlipidemia  Asthma with allergic rhinitis, unspecified asthma severity, uncomplicated - controlled on current regimen, with prn use of albuterol - Plan: albuterol (PROVENTIL HFA;VENTOLIN HFA) 108 (90 Base) MCG/ACT inhaler  Obesity (BMI 30-39.9) - reviewed diet, exercise, encouraged weight loss  Hip bursitis, unspecified laterality - encouraged rx ibuprofen TID x 10d; consider cortisone injection if not improving. ITB stretches shown also - Plan: ibuprofen (ADVIL,MOTRIN) 800 MG tablet  FMLA forms were filled out and faxed. Overall, doing well with avoidance of allergens at work.  F/u 6 months, as scheduled, for CPE

## 2015-08-21 DIAGNOSIS — Z01419 Encounter for gynecological examination (general) (routine) without abnormal findings: Secondary | ICD-10-CM | POA: Diagnosis not present

## 2015-08-21 DIAGNOSIS — Z6836 Body mass index (BMI) 36.0-36.9, adult: Secondary | ICD-10-CM | POA: Diagnosis not present

## 2015-08-21 DIAGNOSIS — Z1231 Encounter for screening mammogram for malignant neoplasm of breast: Secondary | ICD-10-CM | POA: Diagnosis not present

## 2015-08-23 LAB — HM MAMMOGRAPHY

## 2015-08-26 LAB — HM PAP SMEAR: HM Pap smear: NORMAL

## 2015-09-11 ENCOUNTER — Telehealth: Payer: Self-pay | Admitting: Family Medicine

## 2015-09-11 MED ORDER — LISINOPRIL-HYDROCHLOROTHIAZIDE 20-12.5 MG PO TABS: 1.0000 | ORAL_TABLET | Freq: Every day | ORAL | 0 refills | Status: DC

## 2015-09-11 MED ORDER — MONTELUKAST SODIUM 10 MG PO TABS: ORAL_TABLET | ORAL | 0 refills | Status: DC

## 2015-09-11 MED FILL — MONTELUKAST SOD 10 MG TAB: 10 | 90 days supply | Qty: 90 | Fill #0

## 2015-09-11 MED FILL — LISINOPRIL-HCTZ 20-12.5 MG: 20-12.5 | 90 days supply | Qty: 90 | Fill #0

## 2015-09-11 NOTE — Telephone Encounter (Signed)
Rcvd refill request for Montelukast 10 mg #90 & Lisinopril 20-12.5 mg # 90

## 2015-10-07 MED FILL — CLINDAMYCIN HCL 150 MG CAP: 150 | 7 days supply | Qty: 28 | Fill #0

## 2015-11-01 ENCOUNTER — Telehealth: Payer: Self-pay | Admitting: Family Medicine

## 2015-11-01 DIAGNOSIS — M7070 Other bursitis of hip, unspecified hip: Secondary | ICD-10-CM

## 2015-11-01 MED ORDER — IBUPROFEN 800 MG PO TABS: ORAL_TABLET | ORAL | 0 refills | Status: DC

## 2015-11-01 MED FILL — IBUPROFEN 800 MG TABLET: 800 | 20 days supply | Qty: 60 | Fill #0

## 2015-11-01 NOTE — Telephone Encounter (Signed)
Pt called for refill of ibuprofen. She will be out by Monday. Pt uses [facility]. Sending to Dr. Tomi Bamberger and Audelia Acton.

## 2015-11-01 NOTE — Telephone Encounter (Signed)
Pt informed

## 2015-11-01 NOTE — Telephone Encounter (Signed)
Last filled #60 on 6/5.  Refill sent

## 2015-11-18 ENCOUNTER — Ambulatory Visit (INDEPENDENT_AMBULATORY_CARE_PROVIDER_SITE_OTHER): Payer: 59 | Admitting: Orthopaedic Surgery

## 2015-12-06 ENCOUNTER — Telehealth: Payer: Self-pay | Admitting: Family Medicine

## 2015-12-06 NOTE — Telephone Encounter (Signed)
Pt called and is requesting that she come in before her cpe to get her fasting labs her appt is on November 16th, just want to make that is ok before I scedeule that.pt can be reahed at [phone]

## 2015-12-06 NOTE — Telephone Encounter (Signed)
She had labs in May, which all looked good. They really only need to be checked once a year  Looks like blood count (CBC) hasn't been checked in a couple of years--this can be done at her visit (nonfasting).  She has had some high cholesterol in the past, last check in May was better/okay.  If no change in diet since May, it doesn't need to be checked, only needs to be checked once a year.   If she wants the cholesterol rechecked, then we need to enter future orders for lipid panel and CBC (dx hypercholesterolemia and annual exam for the CBC), please send to nurse to enter future orders. Thanks

## 2015-12-16 ENCOUNTER — Other Ambulatory Visit: Payer: Self-pay | Admitting: Family Medicine

## 2015-12-16 MED FILL — MONTELUKAST SOD 10 MG TAB: 10 | 90 days supply | Qty: 90 | Fill #0

## 2015-12-16 MED FILL — LISINOPRIL-HCTZ 20-12.5 MG: 20-12.5 | 90 days supply | Qty: 90 | Fill #0

## 2015-12-31 NOTE — Progress Notes (Signed)
Chief Complaint  Patient presents with  . Annual Exam    fasting annual exam/med check, no pap-sees Dr Corinna Capra and is UTD. UA showed 1+ blood, no symptoms.  Did not do eye exam-had one with Dr.Koop earlier this year. No concerns.     Candace Walker is a 45 y.o. female who presents for a complete physical.  She has the following concerns:  Needs FMLA forms filled out.   Hypertension follow-up: Blood pressures elsewhere are fine when checked at work. Denies dizziness, headaches, chest pain, muscle cramps or edema. Denies side effects of medications. She decreased her exercise just to 2x/week due to some issues with hip and knee pain.  Allergies and asthma: Doing well on her current regimen of Singulair, Allegra, Flonase and nasal saline spray.FMLA forms need to be completed today--she only was seen once for acute illness (1/20). She needed to use albuterol last week after a family member of a patient came in with heavy cologne.  She always takes a 30 minute break faster exposure and need for albuterol (and avoids going back into that particular room for the rest of the shift).  This happens up to 1-2 times/month. Tries to avoid by switching with somebody when she sees a lot of flowers in the patient's room, before any problems.  She missed 2 days last month related to her mother's illness.  She had called out sick--wasn't supposed to be FMLA, sent by her boss in error.  H/o vitamin D deficiency: She is compliant with daily supplement. Last check was 11/2013, level was 37 on the same vitamin.  Hyperlipidemia: This has been diet controlled, she has never taken cholesterol medication. Last lipids were 6 months ago and were improved, but had been elevated on check prior to that. Lab Results  Component Value Date   CHOL 203 (H) 07/09/2015   CHOL 224 (H) 11/02/2014   CHOL 182 04/27/2014   Lab Results  Component Value Date   HDL 47 07/09/2015   HDL 46 11/02/2014   HDL 42 (L) 04/27/2014    Lab Results  Component Value Date   LDLCALC 129 07/09/2015   LDLCALC 151 (H) 11/02/2014   LDLCALC 124 (H) 04/27/2014   Lab Results  Component Value Date   TRIG 133 07/09/2015   TRIG 134 11/02/2014   TRIG 79 04/27/2014   Lab Results  Component Value Date   CHOLHDL 4.3 07/09/2015   CHOLHDL 4.9 11/02/2014   CHOLHDL 4.3 04/27/2014   No results found for: LDLDIRECT  She has some pain in the right knee. Sometimes with walking it feels like "her kneecap might separate".  She also has some pain behind her knees, no swelling.  Stretching helps.  Denies giving way, significant swelling, or trauma. She has pain in the knees with exercise bike.  She also has a lot of ongoing hip pain bilaterally.  Hurts with exercise, and to sleep on her side (needs to sleep on back/stomach).  Recently has also been having pain in the groin and into the thighs.  She takes ibuprofen up to twice daily--always at night, sometimes in the morning.  Denies side effects.  Immunization History  Administered Date(s) Administered  . Influenza Split 10/15/2010, 11/19/2011, 10/31/2013  . Influenza Whole 11/12/2012  . Influenza-Unspecified 11/05/2014  . Pneumococcal Polysaccharide-23 04/24/2013  . Tdap 04/24/2013   Yearly flu shots at work [employer]) Last Pap smear:  02/2015 (per patient, sees Dr. Corinna Capra, GYN); pt states done yearly (s/p hysterectomy) Last mammogram: at  Dr. Gregor Hams office (Jan 2017) Last colonoscopy: never Last DEXA: never Dentist: twice yearly Ophtho: Yearly Exercise: 2 days/week at [employer] (stair climber, elliptical, stretches, weights).  Sometimes she goes less--related to hip pain.  She does a lot of stretching.  Past Medical History:  Diagnosis Date  . Allergic rhinitis, cause unspecified   . Arthritis    bursitis in hips, recently dx with avascular necrosis femor head  . Asthma with allergic rhinitis    rarely uses inhaler unless she gets sick  . Essential hypertension,  benign   . Mixed hyperlipidemia    no meds, diet controlled  . Positive PPD    h/o BCG vaccine, negative chest xray  . SVD (spontaneous vaginal delivery)    x 2  . Unspecified vitamin D deficiency     Past Surgical History:  Procedure Laterality Date  . ENDOMETRIAL ABLATION  2007   Dr. Corinna Capra  . LAPAROSCOPIC ASSISTED VAGINAL HYSTERECTOMY N/A 06/12/2013   Procedure: LAPAROSCOPIC ASSISTED VAGINAL HYSTERECTOMY;  Surgeon: Luz Lex, MD;  Location: [facility];  Service: Gynecology;  Laterality: N/A;  . LAPAROSCOPIC BILATERAL SALPINGECTOMY  06/12/2013   Procedure: LAPAROSCOPIC BILATERAL SALPINGECTOMY;  Surgeon: Luz Lex, MD;  Location: [facility];  Service: Gynecology;;  . TUBAL LIGATION    . WISDOM TOOTH EXTRACTION     right bottom lower tooth    Social History   Social History  . Marital status: Divorced    Spouse name: N/A  . Number of children: 2  . Years of education: N/A   Occupational History  . [employer]   Social History Main Topics  . Smoking status: Never Smoker  . Smokeless tobacco: Never Used  . Alcohol use Yes     Comment: occasional (once a month)  . Drug use: No  . Sexual activity: Not Currently    Birth control/ protection: Surgical     Comment: ablation and tubal ligation   Other Topics Concern  . Not on file   Social History Narrative   Lives with her son; no pets. Divorced from her husband.  Daughter is in the army, living in [place].    Family History  Problem Relation Age of Onset  . Diabetes Mother   . Hypertension Mother   . Allergies Mother   . Heart disease Father 53    MI  . Hyperlipidemia Father   . Hypertension Father   . Cancer Paternal Grandmother     breast cancer in 5's    Outpatient Encounter Prescriptions as of 01/02/2016  Medication Sig Note  . albuterol (PROVENTIL HFA;VENTOLIN HFA) 108 (90 Base) MCG/ACT inhaler Inhale 2 puffs into the lungs every 6 (six) hours as needed for wheezing. 01/02/2016: Used last week (recent  cold, and patient with flowers/family member with cologne)  . cholecalciferol (VITAMIN D) 1000 UNITS tablet Take 1,000 Units by mouth daily. Reported on 07/22/2015   . fexofenadine (ALLEGRA) 180 MG tablet Take 180 mg by mouth at bedtime. Reported on 07/22/2015   . fish oil-omega-3 fatty acids 1000 MG capsule Take 3 g by mouth daily. Reported on 07/22/2015   . lisinopril-hydrochlorothiazide (PRINZIDE,ZESTORETIC) 20-12.5 MG tablet Take 1 tablet by mouth daily.   . montelukast (SINGULAIR) 10 MG tablet TAKE 1 TABLET (10 MG TOTAL) BY MOUTH AT BEDTIME.   Marland Kitchen ibuprofen (ADVIL,MOTRIN) 800 MG tablet TAKE 1 TABLET BY MOUTH EVERY 8 (EIGHT) HOURS AS NEEDED. USE FOR 1-2 WEEKS AS NEEDED FOR HIP BURSITIS/PAIN (Patient not taking: Reported on  01/02/2016) 01/02/2016: Takes every night, and in the morning only if having pain at work  . [DISCONTINUED] lisinopril-hydrochlorothiazide (PRINZIDE,ZESTORETIC) 20-12.5 MG tablet TAKE 1 TABLET BY MOUTH DAILY.    No facility-administered encounter medications on file as of 01/02/2016.     Allergies  Allergen Reactions  . Nexium [Esomeprazole Magnesium] Itching  . Penicillins Hives  . Percocet [Oxycodone-Acetaminophen] Itching    ROS: The patient denies anorexia, fever, headaches, vision changes, decreased hearing, ear pain, sore throat, breast concerns, chest pain, palpitations, dizziness, syncope, dyspnea on exertion, cough, swelling, nausea, vomiting, diarrhea, constipation, abdominal pain, melena, hematochezia, hematuria, incontinence, dysuria, vaginal bleeding (s/p hysterectomy), discharge, odor or itch, genital lesions, numbness, tingling, weakness, tremor, suspicious skin lesions, depression, anxiety, abnormal bleeding or enlarged lymph nodes. +bruising on her shins and arms. (regular ibuprofen use). Slight weight gain noted (less exercise related to hip/knee pain). Allergies are controlled; breathing is good. Recent URI that is resolving.  Needed albuterol last week  related to allergen exposure at work and laso URI. Hip and knee pain as per HPI.  Denies back pain. Heartburn only with certain foods (tomato sauce), infrequent.   PHYSICAL EXAM:  BP 110/72 (BP Location: Left Arm, Patient Position: Sitting, Cuff Size: Normal)   Pulse 80   Ht 5\' 9"  (1.753 m)   Wt 240 lb (108.9 kg)   LMP 11/07/2012   BMI 35.44 kg/m   General Appearance:   Alert, cooperative, no distress, appears stated age  Head:   Normocephalic, without obvious abnormality, atraumatic  Eyes:   PERRL, conjunctiva/corneas clear, EOM's intact, fundi   benign  Ears:   Normal TM's and external ear canals  Nose:  Nares normal, mucosa is normal, no drainage or sinus tenderness  Throat:  Lips, mucosa, and tongue normal; teeth and gums normal  Neck:  Supple, no lymphadenopathy; thyroid: no enlargement/tenderness/nodules; no carotid bruit or JVD  Back:  Spine nontender, no curvature, ROM normal, no CVAtenderness  Lungs:   Clear to auscultation bilaterally without wheezes, rales orronchi; respirations unlabored  Chest Wall:   No tenderness or deformity  Heart:   Regular rate and rhythm, S1 and S2 normal, no murmur, rub  or gallop  Breast Exam:   Deferred to GYN  Abdomen:   Soft, non-tender, nondistended, normoactive bowel sounds,   no masses, no hepatosplenomegaly  Genitalia:   Deferred to GYN     Extremities:  No clubbing, cyanosis or edema. Hips--some discomfort on the right lateral hip with internal rotation. FROM, no pain with external rotation.  Pain with right hip flexion against resistance at groin/hip flexors.  No pain on the left. Subungual hematoma/bruise on the right thumb, base of the nail (injured it in car door, nontender now).  Pulses:  2+ and symmetric all extremities  Skin:  Skin color, texture, turgor normal, no rashes or lesions  Lymph nodes:  Cervical, supraclavicular, and axillary nodes normal   Neurologic:  CNII-XII intact, normal strength, sensation and gait; reflexes 2+ and symmetric throughout   Psych: Normal mood, affect, hygiene and grooming   ASSESSMENT/PLAN:  Annual physical exam - Plan: POCT Urinalysis Dipstick, Comprehensive metabolic panel, Lipid panel, CBC with Differential/Platelet  Mixed hyperlipidemia - diet controlled - Plan: Lipid panel  Essential hypertension, benign - well controlled - Plan: Comprehensive metabolic panel  Asthma with allergic rhinitis, unspecified asthma severity, uncomplicated - controlled on current regimen - Plan: Spirometry with Graph, albuterol (PROVENTIL HFA;VENTOLIN HFA) 108 (90 Base) MCG/ACT inhaler  Obesity (BMI 30-39.9) - encouraged weight loss; discussed  risks/diet/exercise  Family history of colonic polyps - Plan: Ambulatory referral to Gastroenterology  Screen for colon cancer - Plan: Ambulatory referral to Gastroenterology  Medication monitoring encounter - Plan: Comprehensive metabolic panel, CBC with Differential/Platelet  Asthma with allergic rhinitis, unspecified asthma severity, uncomplicated - controlled on current regimen, with prn use of albuterol - Plan: Spirometry with Graph, albuterol (PROVENTIL HFA;VENTOLIN HFA) 108 (90 Base) MCG/ACT inhaler  Knee pain, unspecified chronicity, unspecified laterality - encouraged weight loss; discussed proper seat position for bike, stretches  Trochanteric bursitis of both hips - return for cortisone injections when desired; ibuprofen prn. also some pain related to hip flexors, buttock muscles. Stretches shown   Spirometry today-normal  FMLA forms filled out--to be scanned in.   Discussed monthly self breast exams and yearly mammograms; at least 30 minutes of aerobic activity at least 5 days/week, weight-bearing exercise at least 2x/week; proper sunscreen use reviewed; healthy diet, including goals of calcium and vitamin D intake and alcohol  recommendations (less than or equal to 1 drink/day) reviewed; regular seatbelt use; changing batteries in smoke detectors. Immunization recommendations discussed--UTD. Continue yearly flu shots. Colonoscopy recommendations reviewed--recommended she have now due to mother having many colon polyps, starting in her mid-50's.   Refer to [facility] (initially stated Dr. Michail Sermon at [facility] (her mother's doctor), but will be more cost effective for her to stay with a [facility] provider).  F/u 6 months; return sooner for cortisone injections for trochanteric bursitis, if desired.   We are referring you to [facility] for colonoscopy due to your  Mother's history of colon polyps starting at a fairly young age.  I recommend that you get at least 150 minutes of aerobic exercise each week.  This can be divided into shorter intervals so it might be easier on your hips/knees.  If your knees hurt on the bike--lessen the tension and make sure the bike is set up properly (you may be too close and need to adjust your seat).  Non-weight bearing exercise like swimming, bike are the best for the hips and knees.  Do the hip stretches as shown (for the deep buttock muscles, and the hip flexors (lunge position). Return for cortisone shots for the hip bursitis when you are ready.

## 2016-01-02 ENCOUNTER — Telehealth: Payer: Self-pay

## 2016-01-02 ENCOUNTER — Ambulatory Visit (INDEPENDENT_AMBULATORY_CARE_PROVIDER_SITE_OTHER): Payer: 59 | Admitting: Family Medicine

## 2016-01-02 ENCOUNTER — Encounter: Payer: Self-pay | Admitting: Gastroenterology

## 2016-01-02 ENCOUNTER — Encounter: Payer: Self-pay | Admitting: Family Medicine

## 2016-01-02 VITALS — BP 110/72 | HR 80 | Ht 69.0 in | Wt 240.0 lb

## 2016-01-02 DIAGNOSIS — M7061 Trochanteric bursitis, right hip: Secondary | ICD-10-CM | POA: Diagnosis not present

## 2016-01-02 DIAGNOSIS — E782 Mixed hyperlipidemia: Secondary | ICD-10-CM | POA: Diagnosis not present

## 2016-01-02 DIAGNOSIS — Z0000 Encounter for general adult medical examination without abnormal findings: Secondary | ICD-10-CM

## 2016-01-02 DIAGNOSIS — Z5181 Encounter for therapeutic drug level monitoring: Secondary | ICD-10-CM | POA: Diagnosis not present

## 2016-01-02 DIAGNOSIS — J45909 Unspecified asthma, uncomplicated: Secondary | ICD-10-CM

## 2016-01-02 DIAGNOSIS — I10 Essential (primary) hypertension: Secondary | ICD-10-CM | POA: Diagnosis not present

## 2016-01-02 DIAGNOSIS — Z8371 Family history of colonic polyps: Secondary | ICD-10-CM

## 2016-01-02 DIAGNOSIS — M25569 Pain in unspecified knee: Secondary | ICD-10-CM

## 2016-01-02 DIAGNOSIS — E669 Obesity, unspecified: Secondary | ICD-10-CM | POA: Diagnosis not present

## 2016-01-02 DIAGNOSIS — Z1211 Encounter for screening for malignant neoplasm of colon: Secondary | ICD-10-CM

## 2016-01-02 DIAGNOSIS — M7062 Trochanteric bursitis, left hip: Secondary | ICD-10-CM | POA: Diagnosis not present

## 2016-01-02 DIAGNOSIS — Z83719 Family history of colon polyps, unspecified: Secondary | ICD-10-CM

## 2016-01-02 LAB — COMPREHENSIVE METABOLIC PANEL
ALK PHOS: 48 U/L (ref 33–115)
ALT: 17 U/L (ref 6–29)
AST: 23 U/L (ref 10–35)
Albumin: 3.9 g/dL (ref 3.6–5.1)
BUN: 11 mg/dL (ref 7–25)
CO2: 24 mmol/L (ref 20–31)
CREATININE: 0.92 mg/dL (ref 0.50–1.10)
Calcium: 9.3 mg/dL (ref 8.6–10.2)
Chloride: 103 mmol/L (ref 98–110)
GLUCOSE: 85 mg/dL (ref 65–99)
POTASSIUM: 4.1 mmol/L (ref 3.5–5.3)
SODIUM: 135 mmol/L (ref 135–146)
Total Bilirubin: 0.3 mg/dL (ref 0.2–1.2)
Total Protein: 7.6 g/dL (ref 6.1–8.1)

## 2016-01-02 LAB — CBC WITH DIFFERENTIAL/PLATELET
BASOS PCT: 0 %
Basophils Absolute: 0 cells/uL (ref 0–200)
Eosinophils Absolute: 0 cells/uL — ABNORMAL LOW (ref 15–500)
Eosinophils Relative: 0 %
HCT: 38.7 % (ref 35.0–45.0)
Hemoglobin: 13.1 g/dL (ref 11.7–15.5)
LYMPHS PCT: 35 %
Lymphs Abs: 1680 cells/uL (ref 850–3900)
MCH: 29.3 pg (ref 27.0–33.0)
MCHC: 33.9 g/dL (ref 32.0–36.0)
MCV: 86.6 fL (ref 80.0–100.0)
MONOS PCT: 8 %
MPV: 12.3 fL (ref 7.5–12.5)
Monocytes Absolute: 384 cells/uL (ref 200–950)
NEUTROS ABS: 2736 {cells}/uL (ref 1500–7800)
Neutrophils Relative %: 57 %
PLATELETS: 173 10*3/uL (ref 140–400)
RBC: 4.47 MIL/uL (ref 3.80–5.10)
RDW: 13.8 % (ref 11.0–15.0)
WBC: 4.8 10*3/uL (ref 4.0–10.5)

## 2016-01-02 LAB — LIPID PANEL
CHOL/HDL RATIO: 4 ratio (ref <5.0)
Cholesterol: 219 mg/dL — ABNORMAL HIGH (ref <200)
HDL: 55 mg/dL (ref >50)
LDL Cholesterol: 135 mg/dL — ABNORMAL HIGH (ref <100)
Triglycerides: 147 mg/dL (ref <150)
VLDL: 29 mg/dL (ref <30)

## 2016-01-02 LAB — POCT URINALYSIS DIPSTICK
Bilirubin, UA: NEGATIVE
Glucose, UA: NEGATIVE
KETONES UA: NEGATIVE
LEUKOCYTES UA: NEGATIVE
NITRITE UA: NEGATIVE
PH UA: 6
PROTEIN UA: NEGATIVE
Spec Grav, UA: 1.02
UROBILINOGEN UA: NEGATIVE

## 2016-01-02 MED ORDER — ALBUTEROL SULFATE HFA 108 (90 BASE) MCG/ACT IN AERS: 2.0000 | INHALATION_SPRAY | Freq: Four times a day (QID) | RESPIRATORY_TRACT | 1 refills | Status: DC | PRN

## 2016-01-02 NOTE — Telephone Encounter (Signed)
Pt returned call to office and request FMLA papers be mailed to pt home address. Candace Walker

## 2016-01-02 NOTE — Telephone Encounter (Signed)
LM on VCM to call back. Need to know if pt wants to pick up her copy of FMLA or mail to her home. FMLA papers completed, faxed to number requested. Pt copy placed in box at front desk for pick up.

## 2016-01-02 NOTE — Patient Instructions (Addendum)
  HEALTH MAINTENANCE RECOMMENDATIONS:  It is recommended that you get at least 30 minutes of aerobic exercise at least 5 days/week (for weight loss, you may need as much as 60-90 minutes). This can be any activity that gets your heart rate up. This can be divided in 10-15 minute intervals if needed, but try and build up your endurance at least once a week.  Weight bearing exercise is also recommended twice weekly.  Eat a healthy diet with lots of vegetables, fruits and fiber.  "Colorful" foods have a lot of vitamins (ie green vegetables, tomatoes, red peppers, etc).  Limit sweet tea, regular sodas and alcoholic beverages, all of which has a lot of calories and sugar.  Up to 1 alcoholic drink daily may be beneficial for women (unless trying to lose weight, watch sugars).  Drink a lot of water.  Calcium recommendations are 1200-1500 mg daily (1500 mg for postmenopausal women or women without ovaries), and vitamin D 1000 IU daily.  This should be obtained from diet and/or supplements (vitamins), and calcium should not be taken all at once, but in divided doses.  Monthly self breast exams and yearly mammograms for women over the age of 35 is recommended.  Sunscreen of at least SPF 30 should be used on all sun-exposed parts of the skin when outside between the hours of 10 am and 4 pm (not just when at beach or pool, but even with exercise, golf, tennis, and yard work!)  Use a sunscreen that says "broad spectrum" so it covers both UVA and UVB rays, and make sure to reapply every 1-2 hours.  Remember to change the batteries in your smoke detectors when changing your clock times in the spring and fall.  Use your seat belt every time you are in a car, and please drive safely and not be distracted with cell phones and texting while driving.   We are referring you to [facility] for colonoscopy due to your  Mother's history of colon polyps starting at a fairly young age.  I recommend that you get at least 150  minutes of aerobic exercise each week.  This can be divided into shorter intervals so it might be easier on your hips/knees.  If your knees hurt on the bike--lessen the tension and make sure the bike is set up properly (you may be too close and need to adjust your seat).  Non-weight bearing exercise like swimming, bike are the best for the hips and knees.  Do the hip stretches as shown (for the deep buttock muscles, and the hip flexors (lunge position). Return for cortisone shots for the hip bursitis when you are ready.

## 2016-01-22 ENCOUNTER — Telehealth: Payer: Self-pay | Admitting: Family Medicine

## 2016-01-22 ENCOUNTER — Ambulatory Visit: Payer: 59 | Admitting: Gastroenterology

## 2016-01-22 ENCOUNTER — Other Ambulatory Visit: Payer: Self-pay | Admitting: Family Medicine

## 2016-01-22 DIAGNOSIS — M7070 Other bursitis of hip, unspecified hip: Secondary | ICD-10-CM

## 2016-01-22 MED ORDER — IBUPROFEN 800 MG PO TABS: ORAL_TABLET | ORAL | 1 refills | Status: DC

## 2016-01-22 MED FILL — IBUPROFEN 800 MG TABLET: 800 | 20 days supply | Qty: 60 | Fill #0

## 2016-01-22 NOTE — Telephone Encounter (Signed)
Pt called for refill of Ibuprofen. Please send to [facility] out pt pharmacy.

## 2016-01-22 NOTE — Telephone Encounter (Signed)
Is this okay to refill? 

## 2016-01-22 NOTE — Telephone Encounter (Signed)
Last filled #60 9/15. Filled #60 with 1 RF

## 2016-02-21 ENCOUNTER — Encounter: Payer: Self-pay | Admitting: Family Medicine

## 2016-03-23 MED FILL — IBUPROFEN 800 MG TABLET: 800 | 20 days supply | Qty: 60 | Fill #1

## 2016-03-24 ENCOUNTER — Telehealth: Payer: Self-pay | Admitting: Family Medicine

## 2016-03-24 NOTE — Telephone Encounter (Signed)
Rcvd refill request for Lisinopril 20-12.5mg  #90 and Montelukast 10 mg #90

## 2016-03-25 ENCOUNTER — Other Ambulatory Visit: Payer: Self-pay | Admitting: *Deleted

## 2016-03-25 MED ORDER — MONTELUKAST SODIUM 10 MG PO TABS: ORAL_TABLET | ORAL | 0 refills | Status: DC

## 2016-03-25 MED ORDER — LISINOPRIL-HYDROCHLOROTHIAZIDE 20-12.5 MG PO TABS: 1.0000 | ORAL_TABLET | Freq: Every day | ORAL | 0 refills | Status: DC

## 2016-03-25 MED FILL — LISINOPRIL-HCTZ 20-12.5 MG: 20-12.5 | 90 days supply | Qty: 90 | Fill #0

## 2016-03-25 MED FILL — MONTELUKAST SOD 10 MG TAB: 10 | 90 days supply | Qty: 90 | Fill #0

## 2016-03-25 NOTE — Telephone Encounter (Signed)
Done

## 2016-03-26 ENCOUNTER — Ambulatory Visit: Payer: 59 | Admitting: Family Medicine

## 2016-03-26 ENCOUNTER — Encounter: Payer: Self-pay | Admitting: Family Medicine

## 2016-03-26 ENCOUNTER — Ambulatory Visit (INDEPENDENT_AMBULATORY_CARE_PROVIDER_SITE_OTHER): Payer: 59 | Admitting: Family Medicine

## 2016-03-26 VITALS — BP 128/72 | HR 60 | Temp 98.0°F | Ht 69.0 in | Wt 237.0 lb

## 2016-03-26 DIAGNOSIS — R05 Cough: Secondary | ICD-10-CM | POA: Diagnosis not present

## 2016-03-26 DIAGNOSIS — R059 Cough, unspecified: Secondary | ICD-10-CM

## 2016-03-26 DIAGNOSIS — J40 Bronchitis, not specified as acute or chronic: Secondary | ICD-10-CM | POA: Diagnosis not present

## 2016-03-26 DIAGNOSIS — J329 Chronic sinusitis, unspecified: Secondary | ICD-10-CM

## 2016-03-26 DIAGNOSIS — J4521 Mild intermittent asthma with (acute) exacerbation: Secondary | ICD-10-CM | POA: Diagnosis not present

## 2016-03-26 MED ORDER — HYDROCODONE-HOMATROPINE 5-1.5 MG/5ML PO SYRP: 5.0000 mL | ORAL_SOLUTION | Freq: Four times a day (QID) | ORAL | 0 refills | Status: DC | PRN

## 2016-03-26 MED ORDER — DOXYCYCLINE HYCLATE 100 MG PO TABS: 100.0000 mg | ORAL_TABLET | Freq: Two times a day (BID) | ORAL | 0 refills | Status: DC

## 2016-03-26 MED FILL — DOXYCYCLINE HYCLATE 100 MG: 100 | 10 days supply | Qty: 20 | Fill #0

## 2016-03-26 MED FILL — HYDROCODONE-HOMATROPINE SYR: 5-1.5 | 5 days supply | Qty: 100 | Fill #0

## 2016-03-26 NOTE — Patient Instructions (Addendum)
  Drink plenty of water. Instead of the OTC medications you have been taking, change to Mucinex (guaifenesin). I recommend the 12 hour version, and take it twice daily so it works to keep the phlegm and mucus thin all day/night long. (get the plain kind, not the D or DM). You may use short-acting decongestant (ie sudafed) as needed for any sinus pain or pressure. Your lungs sound pretty clear as far as wheezing.  Continue the albuterol just as needed.  Start the antibiotic--your choice if you want to wait 24 hours and see how the above medications and rest make you feel, and if tomorrow you feel just as bad, with any fever or ongoing discolored mucus, definitely start it then.  Use the cough syrup at bedtime. You may use it up to three times daily. It has hydrocodone in it--this may make you sleepy.  Use with caution during the day, don't drive while taking it.  Continue ibuprofen and/or tylenol as needed for fever, body aches, shoulder pain.  Return next week for re-evaluation if ongoing/worsening muscle pain/joint pain

## 2016-03-26 NOTE — Progress Notes (Signed)
Chief Complaint  Patient presents with  . Cough    x 7 days. Mucus is yellow and had a temp of 100.8 Saturday, after that has been low grade. Has had chills and body aches.    Started last week with a sore throat, then progressed to cough, worsening sore throat. 3 days ago she started vomiting phlegm (white mucus) after coughing.  She took Alka Selzer Cough and Cold (day and night), finished the box yesterday.  It helped some.  Starts feeling worse at 3 am (took evening meds at 9pm).  She started with body aches (and all symptoms getting worse) 4 nights ago.  She has been drinking more fluids, tea, saline nasal spray and throat lozenges.   Phlegm changed to a yellow color 2 days ago. She can't sleep at night--coughing a lot, getting up a lot of phlegm.  Left sided cheek pain at night.  She has needed to use her inhaler twice earlier this week, but not in the last 2 days.   Last OTC meds were 11:30pm (over 12 hours ago).  PMH, PSH, SH reviewed  Outpatient Encounter Prescriptions as of 03/26/2016  Medication Sig  . albuterol (PROVENTIL HFA;VENTOLIN HFA) 108 (90 Base) MCG/ACT inhaler Inhale 2 puffs into the lungs every 6 (six) hours as needed for wheezing.  . cholecalciferol (VITAMIN D) 1000 UNITS tablet Take 1,000 Units by mouth daily. Reported on 07/22/2015  . fexofenadine (ALLEGRA) 180 MG tablet Take 180 mg by mouth at bedtime. Reported on 07/22/2015  . fish oil-omega-3 fatty acids 1000 MG capsule Take 3 g by mouth daily. Reported on 07/22/2015  . lisinopril-hydrochlorothiazide (PRINZIDE,ZESTORETIC) 20-12.5 MG tablet Take 1 tablet by mouth daily.  . montelukast (SINGULAIR) 10 MG tablet TAKE 1 TABLET (10 MG TOTAL) BY MOUTH AT BEDTIME.  Marland Kitchen ibuprofen (ADVIL,MOTRIN) 800 MG tablet TAKE 1 TABLET BY MOUTH EVERY 8 (EIGHT) HOURS AS NEEDED. USE FOR 1-2 WEEKS AS NEEDED FOR HIP BURSITIS/PAIN (Patient not taking: Reported on 03/26/2016)   No facility-administered encounter medications on file as of 03/26/2016.     Allergies  Allergen Reactions  . Nexium [Esomeprazole Magnesium] Itching  . Penicillins Hives  . Percocet [Oxycodone-Acetaminophen] Itching   (previously took tussionex without itching)  ROS:  See HPI.  No diarrhea, bleeding, bruising, rash, dizziness, edema, chest pain.  Having some pain in her neck and shoulders (especially the left, can't sleep on the left side). LUE pain preceded recent illness. Left medial elbow pain.  She wears a sleeve which helps.  PHYSICAL EXAM:  BP 128/72 (BP Location: Left Arm, Patient Position: Sitting, Cuff Size: Normal)   Pulse 60   Temp 98 F (36.7 C) (Tympanic)   Ht 5\' 9"  (1.753 m)   Wt 237 lb (107.5 kg)   LMP 11/07/2012   BMI 35.00 kg/m    Well appearing female, speaking easily in full sentences, with intermittent sniffly and dry, hacky cough. HEENT: PERRL, EOMI, conjunctiva and sclera are clear. TM's and EAC's normal. Nasal mucosa is moderately edematous with erythema and whitish drainage noted on the left. Fairly normal on the right. Sinuses nontender. OP is clear Neck: no lymphadenopathy or mass Heart: regular rate and rhythm without murmur Lungs: clear--fair air movement, no wheezes, rales or ronchi.  Slight coarse sounds anteriorly.  ASSESSMENT/PLAN:  Sinobronchitis - Plan: doxycycline (VIBRA-TABS) 100 MG tablet  Cough - Plan: HYDROcodone-homatropine (HYCODAN) 5-1.5 MG/5ML syrup  Mild intermittent asthma with exacerbation - related to URI, responding to prn albuterol  Risks and side effects  of medications reviewed in detail   Drink plenty of water. Instead of the OTC medications you have been taking, change to Mucinex (guaifenesin). I recommend the 12 hour version, and take it twice daily so it works to keep the phlegm and mucus thin all day/night long. (get the plain kind, not the D or DM). You may use short-acting decongestant (ie sudafed) as needed for any sinus pain or pressure. Your lungs sound pretty clear as far as  wheezing.  Continue the albuterol just as needed.  Start the antibiotic--your choice if you want to wait 24 hours and see how the above medications and rest make you feel, and if tomorrow you feel just as bad, with any fever or ongoing discolored mucus, definitely start it then.  Use the cough syrup at bedtime. You may use it up to three times daily. It has hydrocodone in it--this may make you sleepy.  Use with caution during the day, don't drive while taking it.  Continue ibuprofen and/or tylenol as needed for fever, body aches, shoulder pain.  Return next week for re-evaluation if ongoing/worsening muscle pain/joint pain

## 2016-04-13 ENCOUNTER — Ambulatory Visit (INDEPENDENT_AMBULATORY_CARE_PROVIDER_SITE_OTHER): Payer: 59 | Admitting: Orthopaedic Surgery

## 2016-04-13 ENCOUNTER — Encounter: Payer: Self-pay | Admitting: Family Medicine

## 2016-04-13 ENCOUNTER — Ambulatory Visit (INDEPENDENT_AMBULATORY_CARE_PROVIDER_SITE_OTHER): Payer: 59 | Admitting: Family Medicine

## 2016-04-13 VITALS — BP 130/88 | HR 80 | Ht 69.0 in | Wt 236.6 lb

## 2016-04-13 DIAGNOSIS — M65312 Trigger thumb, left thumb: Secondary | ICD-10-CM | POA: Diagnosis not present

## 2016-04-13 DIAGNOSIS — M25511 Pain in right shoulder: Secondary | ICD-10-CM | POA: Diagnosis not present

## 2016-04-13 DIAGNOSIS — M79602 Pain in left arm: Secondary | ICD-10-CM

## 2016-04-13 DIAGNOSIS — M25512 Pain in left shoulder: Secondary | ICD-10-CM | POA: Diagnosis not present

## 2016-04-13 MED ORDER — MELOXICAM 15 MG PO TABS: 15.0000 mg | ORAL_TABLET | Freq: Every day | ORAL | 0 refills | Status: DC

## 2016-04-13 MED FILL — MELOXICAM 15 MG TABLET: 15 | 15 days supply | Qty: 15 | Fill #0

## 2016-04-13 NOTE — Progress Notes (Signed)
Chief Complaint  Patient presents with  . Shoulder Pain    both shoulders and left hand pain. Muscles in both upper arms x 2 months. When she take IBU she feels like her muscles actually ache more.    Complaining of bilateral shoulder pain, muscle soreness in left upper arm, and pain at the base of the left thumb.  Thumb aches constantly.  She saw some swelling in the joint about 3 weeks ago, and sometimes the finger locks in place, with thumb straight, not flexed, has to manually massage the thumb and push it in order to release. She hears clicking with flexion/extension of the left thumb.  Denies any injury or trauma.  It is aching even at rest now.    Shoulders bother her more in the evening when relaxed.  She can only sleep on the right side, hurts to lay on the left side.  It hurts to lift heavy things, and it hurts to raise her arms overhead.    Once she had pain shooting down the left arm (the week she had URI symptoms). Most of the pain in the upper arm is lateral.   Pain in shoulders and left arm for 2 months. Denies neck pain.  She tried ibuprofen 800mg  twice daily (before work and before bed) for one full week--it didn't help, felt like it was worse. She tried Biofreeze, which helps at night. Compression sleeve at night also helps.  PMH, PSH, SH reviewed  Current Outpatient Prescriptions on File Prior to Visit  Medication Sig Dispense Refill  . cholecalciferol (VITAMIN D) 1000 UNITS tablet Take 1,000 Units by mouth daily. Reported on 07/22/2015    . fexofenadine (ALLEGRA) 180 MG tablet Take 180 mg by mouth at bedtime. Reported on 07/22/2015    . fish oil-omega-3 fatty acids 1000 MG capsule Take 3 g by mouth daily. Reported on 07/22/2015    . lisinopril-hydrochlorothiazide (PRINZIDE,ZESTORETIC) 20-12.5 MG tablet Take 1 tablet by mouth daily. 90 tablet 0  . montelukast (SINGULAIR) 10 MG tablet TAKE 1 TABLET (10 MG TOTAL) BY MOUTH AT BEDTIME. 90 tablet 0  . albuterol (PROVENTIL  HFA;VENTOLIN HFA) 108 (90 Base) MCG/ACT inhaler Inhale 2 puffs into the lungs every 6 (six) hours as needed for wheezing. (Patient not taking: Reported on 04/13/2016) 18 g 1  . ibuprofen (ADVIL,MOTRIN) 800 MG tablet TAKE 1 TABLET BY MOUTH EVERY 8 (EIGHT) HOURS AS NEEDED. USE FOR 1-2 WEEKS AS NEEDED FOR HIP BURSITIS/PAIN (Patient not taking: Reported on 03/26/2016) 60 tablet 1   No current facility-administered medications on file prior to visit.    Allergies  Allergen Reactions  . Nexium [Esomeprazole Magnesium] Itching  . Penicillins Hives  . Percocet [Oxycodone-Acetaminophen] Itching   ROS:  No fever, chills, URI symptoms, shortness of breath, chest pain, palpitations. No GI complaints.  No neck pain, numbness, tingling or weakness.  Shoulder/arm pain and thumb pain and triggering as per HPI  PHYSICAL EXAM:  BP 130/88 (BP Location: Left Arm, Patient Position: Sitting, Cuff Size: Normal)   Pulse 80   Ht 5\' 9"  (1.753 m)   Wt 236 lb 9.6 oz (107.3 kg)   LMP 11/07/2012   BMI 34.94 kg/m   Well developed, pleasant female in no distress  Neck: no c-spine tenderness UE's:  Full ROM of both shoulders (left internal rotation is actually better/higher thumbs than on the right).   No impingement--but some of the motions caused discomfort in the deltoid area.   Normal strength with RC testing. Some  discomfort in the left deltoid area with some of these movements. Tender at anterior shoulder joint bilaterally. nontender at [facility] joint. nontender at left deltoid--pain starts there, and radiates posteriorly (to tricep area--no pain with triceps use/testing). Left thumb--some tenderness at the base of the 1st MCP on palmar side.  No flexor tendon nodule is appreciated.  No triggering noted today. Neuro: Normal strength, sensation, DTR's Skin: normal turgor, no rashes or lesions  ASSESSMENT/PLAN:  Bilateral shoulder pain, unspecified chronicity - Plan: meloxicam (MOBIC) 15 MG tablet  Left arm pain  - Plan: meloxicam (MOBIC) 15 MG tablet  Trigger finger of left thumb - Plan: meloxicam (MOBIC) 15 MG tablet  Discussed ROM exercises.  Once pain improved, start strengthening exercises.  If not improving with NSAIDs, refer to PT.  Trigger finger--if not improving with NSAID, refer to hand surgeon for possible injection/treatment.  Risks/side effects of NSAIDs reviewed in detail.   Stop taking ibuprofen. Instead, take meloxicam once daily with food. Take this every day for at least 7-10 days (up to the full 15 day supply, if needed). You may stop if get complete resolution of pain (after 7-10 days). If you aren't noticing much improvement after a week (or if worse), call for referral to physical therapy.  If the left thumb pain isn't improving, we may want to refer you to a hand surgeon for evaluation (and possible injection for trigger finger).

## 2016-04-13 NOTE — Patient Instructions (Signed)
Stop taking ibuprofen. Instead, take meloxicam once daily with food. Take this every day for at least 7-10 days (up to the full 15 day supply, if needed). You may stop if get complete resolution of pain (after 7-10 days). If you aren't noticing much improvement after a week (or if worse), call for referral to physical therapy.  If the left thumb pain isn't improving, we may want to refer you to a hand surgeon for evaluation (and possible injection for trigger finger).  Trigger Finger Trigger finger (digital tendinitis and stenosing tenosynovitis) is a common disorder that causes an often painful catching of the fingers or thumb. It occurs as a clicking, snapping, or locking of a finger in the palm of the hand. This is caused by a problem with the tendons that flex or bend the fingers sliding smoothly through their sheaths. The condition may occur in any finger or a couple fingers at the same time.  The finger may lock with the finger curled or suddenly straighten out with a snap. This is more common in patients with rheumatoid arthritis and diabetes. Left untreated, the condition may get worse to the point where the finger becomes locked in flexion, like making a fist, or less commonly locked with the finger straightened out. CAUSES   Inflammation and scarring that lead to swelling around the tendon sheath.  Repeated or forceful movements.  Rheumatoid arthritis, an autoimmune disease that affects joints.  Gout.  Diabetes mellitus. SIGNS AND SYMPTOMS  Soreness and swelling of your finger.  A painful clicking or snapping as you bend and straighten your finger. DIAGNOSIS  Your health care provider will do a physical exam of your finger to diagnose trigger finger. TREATMENT   Splinting for 6-8 weeks may be helpful.  Nonsteroidal anti-inflammatory medicines (NSAIDs) can help to relieve the pain and inflammation.  Cortisone injections, along with splinting, may speed up recovery.  Several injections may be required. Cortisone may give relief after one injection.  Surgery is another treatment that may be used if conservative treatments do not work. Surgery can be minor, without incisions (a cut does not have to be made), and can be done with a needle through the skin.  Other surgical choices involve an open procedure in which the surgeon opens the hand through a small incision and cuts the pulley so the tendon can again slide smoothly. Your hand will still work fine. HOME CARE INSTRUCTIONS  Apply ice to the injured area, twice per day:  Put ice in a plastic bag.  Place a towel between your skin and the bag.  Leave the ice on for 20 minutes, 3-4 times a day.  Rest your hand often. MAKE SURE YOU:   Understand these instructions.  Will watch your condition.  Will get help right away if you are not doing well or get worse. This information is not intended to replace advice given to you by your health care provider. Make sure you discuss any questions you have with your health care provider. Document Released: 11/23/2003 Document Revised: 10/05/2012 Document Reviewed: 07/05/2012 Elsevier Interactive Patient Education  2017 [place] with the range of motion exercises for the shoulder (below). Once your arm is feeling better, consider adding in the strengthening exercises shown.  Most of these are done 1-2 times/day, 5-10 repetitions, holding for 10 seconds.   Shoulder Exercises Ask your health care provider which exercises are safe for you. Do exercises exactly as told by your health care provider  and adjust them as directed. It is normal to feel mild stretching, pulling, tightness, or discomfort as you do these exercises, but you should stop right away if you feel sudden pain or your pain gets worse.Do not begin these exercises until told by your health care provider. RANGE OF MOTION EXERCISES  These exercises warm up your muscles and joints and  improve the movement and flexibility of your shoulder. These exercises also help to relieve pain, numbness, and tingling. These exercises involve stretching your injured shoulder directly. Exercise A: Pendulum  1. Stand near a wall or a surface that you can hold onto for balance. 2. Bend at the waist and let your left / right arm hang straight down. Use your other arm to support you. Keep your back straight and do not lock your knees. 3. Relax your left / right arm and shoulder muscles, and move your hips and your trunk so your left / right arm swings freely. Your arm should swing because of the motion of your body, not because you are using your arm or shoulder muscles. 4. Keep moving your body so your arm swings in the following directions, as told by your health care provider:  Side to side.  Forward and backward.  In clockwise and counterclockwise circles. 5. Continue each motion for __________ seconds, or for as long as told by your health care provider. 6. Slowly return to the starting position. Repeat __________ times. Complete this exercise __________ times a day. Exercise B:Flexion, Standing  1. Stand and hold a broomstick, a cane, or a similar object. Place your hands a little more than shoulder-width apart on the object. Your left / right hand should be palm-up, and your other hand should be palm-down. 2. Keep your elbow straight and keep your shoulder muscles relaxed. Push the stick down with your healthy arm to raise your left / right arm in front of your body, and then over your head until you feel a stretch in your shoulder.  Avoid shrugging your shoulder while you raise your arm. Keep your shoulder blade tucked down toward the middle of your back. 3. Hold for __________ seconds. 4. Slowly return to the starting position. Repeat __________ times. Complete this exercise __________ times a day. Exercise C: Abduction, Standing 1. Stand and hold a broomstick, a cane, or a similar  object. Place your hands a little more than shoulder-width apart on the object. Your left / right hand should be palm-up, and your other hand should be palm-down. 2. While keeping your elbow straight and your shoulder muscles relaxed, push the stick across your body toward your left / right side. Raise your left / right arm to the side of your body and then over your head until you feel a stretch in your shoulder.  Do not raise your arm above shoulder height, unless your health care provider tells you to do that.  Avoid shrugging your shoulder while you raise your arm. Keep your shoulder blade tucked down toward the middle of your back. 3. Hold for __________ seconds. 4. Slowly return to the starting position. Repeat __________ times. Complete this exercise __________ times a day. Exercise D:Internal Rotation  1. Place your left / right hand behind your back, palm-up. 2. Use your other hand to dangle an exercise band, a towel, or a similar object over your shoulder. Grasp the band with your left / right hand so you are holding onto both ends. 3. Gently pull up on the band until you  feel a stretch in the front of your left / right shoulder.  Avoid shrugging your shoulder while you raise your arm. Keep your shoulder blade tucked down toward the middle of your back. 4. Hold for __________ seconds. 5. Release the stretch by letting go of the band and lowering your hands. Repeat __________ times. Complete this exercise __________ times a day. STRETCHING EXERCISES  These exercises warm up your muscles and joints and improve the movement and flexibility of your shoulder. These exercises also help to relieve pain, numbness, and tingling. These exercises are done using your healthy shoulder to help stretch the muscles of your injured shoulder. Exercise E: Warehouse manager (External Rotation and Abduction)  1. Stand in a doorway with one of your feet slightly in front of the other. This is called a  staggered stance. If you cannot reach your forearms to the door frame, stand facing a corner of a room. 2. Choose one of the following positions as told by your health care provider:  Place your hands and forearms on the door frame above your head.  Place your hands and forearms on the door frame at the height of your head.  Place your hands on the door frame at the height of your elbows. 3. Slowly move your weight onto your front foot until you feel a stretch across your chest and in the front of your shoulders. Keep your head and chest upright and keep your abdominal muscles tight. 4. Hold for __________ seconds. 5. To release the stretch, shift your weight to your back foot. Repeat __________ times. Complete this stretch __________ times a day. Exercise F:Extension, Standing 1. Stand and hold a broomstick, a cane, or a similar object behind your back.  Your hands should be a little wider than shoulder-width apart.  Your palms should face away from your back. 2. Keeping your elbows straight and keeping your shoulder muscles relaxed, move the stick away from your body until you feel a stretch in your shoulder.  Avoid shrugging your shoulders while you move the stick. Keep your shoulder blade tucked down toward the middle of your back. 3. Hold for __________ seconds. 4. Slowly return to the starting position. Repeat __________ times. Complete this exercise __________ times a day. STRENGTHENING EXERCISES  These exercises build strength and endurance in your shoulder. Endurance is the ability to use your muscles for a long time, even after they get tired. Exercise G:External Rotation  1. Sit in a stable chair without armrests. 2. Secure an exercise band at elbow height on your left / right side. 3. Place a soft object, such as a folded towel or a small pillow, between your left / right upper arm and your body to move your elbow a few inches away (about 10 cm) from your side. 4. Hold the  end of the band so it is tight and there is no slack. 5. Keeping your elbow pressed against the soft object, move your left / right forearm out, away from your abdomen. Keep your body steady so only your forearm moves. 6. Hold for __________ seconds. 7. Slowly return to the starting position. Repeat __________ times. Complete this exercise __________ times a day. Exercise H:Shoulder Abduction  1. Sit in a stable chair without armrests, or stand. 2. Hold a __________ weight in your left / right hand, or hold an exercise band with both hands. 3. Start with your arms straight down and your left / right palm facing in, toward your body. 4.  Slowly lift your left / right hand out to your side. Do not lift your hand above shoulder height unless your health care provider tells you that this is safe.  Keep your arms straight.  Avoid shrugging your shoulder while you do this movement. Keep your shoulder blade tucked down toward the middle of your back. 5. Hold for __________ seconds. 6. Slowly lower your arm, and return to the starting position. Repeat __________ times. Complete this exercise __________ times a day. Exercise I:Shoulder Extension 1. Sit in a stable chair without armrests, or stand. 2. Secure an exercise band to a stable object in front of you where it is at shoulder height. 3. Hold one end of the exercise band in each hand. Your palms should face each other. 4. Straighten your elbows and lift your hands up to shoulder height. 5. Step back, away from the secured end of the exercise band, until the band is tight and there is no slack. 6. Squeeze your shoulder blades together as you pull your hands down to the sides of your thighs. Stop when your hands are straight down by your sides. Do not let your hands go behind your body. 7. Hold for __________ seconds. 8. Slowly return to the starting position. Repeat __________ times. Complete this exercise __________ times a day. Exercise  J:Standing Shoulder Row 1. Sit in a stable chair without armrests, or stand. 2. Secure an exercise band to a stable object in front of you so it is at waist height. 3. Hold one end of the exercise band in each hand. Your palms should be in a thumbs-up position. 4. Bend each of your elbows to an "L" shape (about 90 degrees) and keep your upper arms at your sides. 5. Step back until the band is tight and there is no slack. 6. Slowly pull your elbows back behind you. 7. Hold for __________ seconds. 8. Slowly return to the starting position. Repeat __________ times. Complete this exercise __________ times a day. Exercise K:Shoulder Press-Ups  1. Sit in a stable chair that has armrests. Sit upright, with your feet flat on the floor. 2. Put your hands on the armrests so your elbows are bent and your fingers are pointing forward. Your hands should be about even with the sides of your body. 3. Push down on the armrests and use your arms to lift yourself off of the chair. Straighten your elbows and lift yourself up as much as you comfortably can.  Move your shoulder blades down, and avoid letting your shoulders move up toward your ears.  Keep your feet on the ground. As you get stronger, your feet should support less of your body weight as you lift yourself up. 4. Hold for __________ seconds. 5. Slowly lower yourself back into the chair. Repeat __________ times. Complete this exercise __________ times a day. Exercise L: Wall Push-Ups  1. Stand so you are facing a stable wall. Your feet should be about one arm-length away from the wall. 2. Lean forward and place your palms on the wall at shoulder height. 3. Keep your feet flat on the floor as you bend your elbows and lean forward toward the wall. 4. Hold for __________ seconds. 5. Straighten your elbows to push yourself back to the starting position. Repeat __________ times. Complete this exercise __________ times a day. This information is not  intended to replace advice given to you by your health care provider. Make sure you discuss any questions you have with your health  care provider. Document Released: 12/17/2004 Document Revised: 10/28/2015 Document Reviewed: 10/14/2014 Elsevier Interactive Patient Education  2017 [employer].

## 2016-05-25 ENCOUNTER — Ambulatory Visit (INDEPENDENT_AMBULATORY_CARE_PROVIDER_SITE_OTHER): Payer: 59 | Admitting: Family Medicine

## 2016-05-25 ENCOUNTER — Encounter: Payer: Self-pay | Admitting: Family Medicine

## 2016-05-25 VITALS — BP 134/88 | HR 88 | Temp 98.9°F | Ht 69.0 in | Wt 236.6 lb

## 2016-05-25 DIAGNOSIS — B9789 Other viral agents as the cause of diseases classified elsewhere: Secondary | ICD-10-CM | POA: Diagnosis not present

## 2016-05-25 DIAGNOSIS — R05 Cough: Secondary | ICD-10-CM | POA: Diagnosis not present

## 2016-05-25 DIAGNOSIS — J069 Acute upper respiratory infection, unspecified: Secondary | ICD-10-CM | POA: Diagnosis not present

## 2016-05-25 DIAGNOSIS — R509 Fever, unspecified: Secondary | ICD-10-CM | POA: Diagnosis not present

## 2016-05-25 DIAGNOSIS — J45909 Unspecified asthma, uncomplicated: Secondary | ICD-10-CM | POA: Diagnosis not present

## 2016-05-25 DIAGNOSIS — R52 Pain, unspecified: Secondary | ICD-10-CM | POA: Diagnosis not present

## 2016-05-25 DIAGNOSIS — R059 Cough, unspecified: Secondary | ICD-10-CM

## 2016-05-25 DIAGNOSIS — B349 Viral infection, unspecified: Secondary | ICD-10-CM | POA: Diagnosis not present

## 2016-05-25 LAB — POC INFLUENZA A&B (BINAX/QUICKVUE)
INFLUENZA A, POC: NEGATIVE
INFLUENZA B, POC: NEGATIVE

## 2016-05-25 MED ORDER — HYDROCODONE-HOMATROPINE 5-1.5 MG/5ML PO SYRP: 5.0000 mL | ORAL_SOLUTION | Freq: Four times a day (QID) | ORAL | 0 refills | Status: DC | PRN

## 2016-05-25 MED FILL — HYDROCODONE-HOMATROPINE SYR: 5-1.5 | 4 days supply | Qty: 75 | Fill #0

## 2016-05-25 MED FILL — VENTOLIN HFA 90 MCG INHALER: 108 (90 BAS | 25 days supply | Qty: 18 | Fill #0

## 2016-05-25 NOTE — Progress Notes (Signed)
Chief Complaint  Patient presents with  . Cough    started Wed night with bad HA, chills, body aches and just not feeling well. Lots of nasal stuffiness. Cough started Thursday. Fever went away Sat night.    Wednesday (5 days ago) she started with achiness, stuffy head, headache. She had low grade fever (100.6) for a few days, resolved (no fever for the last 1-2 days).  Last night her throat was hurting a lot, coughing a lot.  She continues to have sinus pain (forehead and cheeks, all equal).  Nasal drainage is white. Cough is productive of dark, brown, sometimes yellow.  Cough is worse at night, better if head is elevated.  She doesn't feel tight in her chest, as long as the head is elevated.  Hasn't needed to use albuterol.  She has been compliant with her montelukast, allegra, and also has been using sudafed.  Helps with the congestion, not with the cough.  Hasn't taken any mucinex. She took Tylenol with the fever.  Last night she took ibuprofen 822m, which helped. She reports tessalon hasn't been helpful in the past.  No sick contacts.  PMH, PSH, SH reviewed  Outpatient Encounter Prescriptions as of 05/25/2016  Medication Sig  . acetaminophen (TYLENOL) 500 MG tablet Take 1,000 mg by mouth every 6 (six) hours as needed.  . cholecalciferol (VITAMIN D) 1000 UNITS tablet Take 1,000 Units by mouth daily. Reported on 07/22/2015  . Diphenhydramine-Phenylephrine (SUDAFED PE DAY & NIGHT PO) Take 2 capsules by mouth every 6 (six) hours as needed.  . fexofenadine (ALLEGRA) 180 MG tablet Take 180 mg by mouth at bedtime. Reported on 07/22/2015  . fish oil-omega-3 fatty acids 1000 MG capsule Take 3 g by mouth daily. Reported on 07/22/2015  . ibuprofen (ADVIL,MOTRIN) 800 MG tablet TAKE 1 TABLET BY MOUTH EVERY 8 (EIGHT) HOURS AS NEEDED. USE FOR 1-2 WEEKS AS NEEDED FOR HIP BURSITIS/PAIN  . lisinopril-hydrochlorothiazide (PRINZIDE,ZESTORETIC) 20-12.5 MG tablet Take 1 tablet by mouth daily.  . montelukast  (SINGULAIR) 10 MG tablet TAKE 1 TABLET (10 MG TOTAL) BY MOUTH AT BEDTIME.  . [DISCONTINUED] meloxicam (MOBIC) 15 MG tablet Take 1 tablet (15 mg total) by mouth daily.  .Marland Kitchenalbuterol (PROVENTIL HFA;VENTOLIN HFA) 108 (90 Base) MCG/ACT inhaler Inhale 2 puffs into the lungs every 6 (six) hours as needed for wheezing. (Patient not taking: Reported on 04/13/2016)   No facility-administered encounter medications on file as of 05/25/2016.     ROS:  Denies nausea, vomiting, diarrhea, shortness of breath, chest pain (just after a lot of coughing), bleeding, bruising, rashes.  Myalgias--resolved; low grade fevers resolved.  +sore throat, cough and sinus congestion persists.  PHYSICAL EXAM:  BP 134/88 (BP Location: Right Arm, Patient Position: Sitting, Cuff Size: Normal)   Pulse 88   Temp 98.9 F (37.2 C)   Ht _0  (1.753 m)   Wt 236 lb 9.6 oz (107.3 kg)   LMP 11/07/2012   BMI 34.94 kg/m   Well appearing, pleasant female in no distress. Occasional dry, hacky cough, and sniffling.  Speaking easily in full sentences, in good spirits HEENT: PERRL, EOMi, conjunctiva and sclera are clear. Slightly diffuse light reflexes bilaterally, no erythema of TM's. Nasal mucosa is moderately edematous, no erythema or purulence.  OP--very red posteriorly, tonsils are normal--minimally enlarged, no erythema or exudate.  +tender to palpation over frontal and maxillary sinuses bilaterally Neck: no lymphadenopathy or mass Heart: regular rate and rhythm, no murmur Lungs: clear bilaterally. No wheezes, rales, ronchi Extremities:  no edema Skin: normal turgor, no rash Neuro: alert and oriented, cranial nerves intact, normal gait, strength Psych: normal mood, affect, hygiene and grooming  Influenza tests negative   ASSESSMENT/PLAN:  Viral upper respiratory tract infection - supportive measures reviewed, as well as s/sx bacterial infection and if/when to contact us for poss ABX  Body aches - Plan: POC Influenza A&B (Binax  test)  Chills with fever - Plan: POC Influenza A&B (Binax test)  Cough - Plan: HYDROcodone-homatropine (HYCODAN) 5-1.5 MG/5ML syrup  Viral syndrome  Asthma with allergic rhinitis, unspecified asthma severity, uncomplicated - stable, not flaring with her current URI   Risks/side effects of prescribed medications reviewed.    Drink plenty of fluid. Continue tylenol and/or ibuprofen as needed for pain, fever Continue your regular allergy medications.  Resume regular sudafed as needed for your sinus pain, and to help dry up the postnasal drainage that is contributing to your sore throat. Take Mucinex DM (or separate plain mucinex and Delsym syrup as needed for cough).  The muxinex (guaifenesin) is an expectorant to help keep the mucus and phlegm thin--it should help with the sinus congestion and the cough. Sinus rinses (neti-pot or sinus rinse kit) should also help with your sinus pain.  Use the cough syrup only at bedtime (will cause sedation).  If your fever recurs, your mucus/phlegm remains discolored, please contact us so we can give you an antibiotic.  It is normal to have discolored mucus/phlegm, but after 7-10 days should be starting to improve--lighter, thinner.

## 2016-05-25 NOTE — Patient Instructions (Signed)
  Drink plenty of fluid. Continue tylenol and/or ibuprofen as needed for pain, fever Continue your regular allergy medications.  Resume regular sudafed as needed for your sinus pain, and to help dry up the postnasal drainage that is contributing to your sore throat. Take Mucinex DM (or separate plain mucinex and Delsym syrup as needed for cough).  The muxinex (guaifenesin) is an expectorant to help keep the mucus and phlegm thin--it should help with the sinus congestion and the cough. Sinus rinses (neti-pot or sinus rinse kit) should also help with your sinus pain.  Use the cough syrup only at bedtime (will cause sedation).  If your fever recurs, your mucus/phlegm remains discolored, please contact us so we can give you an antibiotic.  It is normal to have discolored mucus/phlegm, but after 7-10 days should be starting to improve--lighter, thinner.

## 2016-05-26 ENCOUNTER — Telehealth: Payer: Self-pay | Admitting: Family Medicine

## 2016-05-26 MED ORDER — AZITHROMYCIN 250 MG PO TABS: ORAL_TABLET | ORAL | 0 refills | Status: DC

## 2016-05-26 MED FILL — AZITHROMYCIN 250 MG TABLET: 250 | 5 days supply | Qty: 6 | Fill #0

## 2016-05-26 NOTE — Telephone Encounter (Signed)
Pt called and stated that her throat is much worse. She could barely speak. Pt uses [facility] and can be reached at [phone].

## 2016-05-26 NOTE — Telephone Encounter (Signed)
PT. advised

## 2016-05-26 NOTE — Telephone Encounter (Signed)
Allergies  Allergen Reactions  . Nexium [Esomeprazole Magnesium] Itching  . Penicillins Hives  . Percocet [Oxycodone-Acetaminophen] Itching   Advise pt z-pak sent to pharmacy.

## 2016-06-03 ENCOUNTER — Telehealth: Payer: Self-pay | Admitting: *Deleted

## 2016-06-03 NOTE — Telephone Encounter (Signed)
Patient called and would like to know of you would call her. She is upset about the fact that her FMLA states that ONLY 3 consecutive days for the entire year, she feels like if she gets sick and has an asthma flare if could possibly be more than that in year. She was told that it really needed to be changed because 3 consecutive days was not enough for the year.

## 2016-06-03 NOTE — Telephone Encounter (Signed)
Patient advised.

## 2016-06-03 NOTE — Telephone Encounter (Signed)
FMLA states illness up to 5 days, 3x/year.

## 2016-07-06 ENCOUNTER — Encounter: Payer: 59 | Admitting: Family Medicine

## 2016-08-27 DIAGNOSIS — H5203 Hypermetropia, bilateral: Secondary | ICD-10-CM | POA: Diagnosis not present

## 2016-08-27 DIAGNOSIS — H52221 Regular astigmatism, right eye: Secondary | ICD-10-CM | POA: Diagnosis not present

## 2016-08-27 DIAGNOSIS — H524 Presbyopia: Secondary | ICD-10-CM | POA: Diagnosis not present

## 2016-09-21 ENCOUNTER — Other Ambulatory Visit: Payer: Self-pay | Admitting: Family Medicine

## 2016-09-21 MED FILL — LISINOPRIL-HCTZ 20-12.5 MG: 20-12.5 | 30 days supply | Qty: 30 | Fill #0

## 2016-09-21 NOTE — Telephone Encounter (Signed)
LM for pt to CB- needs to schedule Med check. Victorino December

## 2016-10-15 ENCOUNTER — Telehealth: Payer: Self-pay | Admitting: *Deleted

## 2016-10-15 ENCOUNTER — Other Ambulatory Visit: Payer: Self-pay | Admitting: Family Medicine

## 2016-10-15 NOTE — Telephone Encounter (Signed)
Had a refill request come in this am for her singulair. She she was due for med check May (cx'd) and due for CPE Nov. I was going to schedule her now for med check and next avail CPE and then she asked if she would need labs prior to the med check, I was thinking not due until Nov. So not sure what to do-told her I would check with you and come up with a plan and call her back.

## 2016-10-15 NOTE — Telephone Encounter (Signed)
You are correct. Labs are only needed once yearly (unless having problems/symptoms), due in November. Needs c-met and lipids due to her HTN/meds.

## 2016-10-21 NOTE — Telephone Encounter (Signed)
Spoke with patient and she will not schedule med check now. States she cannot afford to see you twice a year and stated that she will talk to you when she comes in and discuss this with her. I tried to explain to her that you fill out her FMLA paperwork twice yearly and she needs to be seen twice yearly and she told me that would not be happening-that she cannot afford it. She told me that she would be doing a CPE in November and lab prior and you would be addressing everything at that visit. I went ahead and scheduled her for a CPE 01/04/17 (second CPE that afternoon, not sure if that is ok or not-she sees GYN and she is NOT under 40 so let me know) I truly did not know what else to do to help in this situation. I also scheduled her for those labs on 12/31/16 and would need future orders if this is ok.

## 2016-10-21 NOTE — Telephone Encounter (Signed)
I'm okay with her being seen once yearly. However, I will only fill out her FMLA forms once a year, or she will be charged a form fee (that she needs to pay before they are sent back).  I will fill out FMLA in November at her visit--make sure forms are here for her visit to avoid additional form fees.  c-met and lipids (dx CPE, HTN)

## 2016-10-22 ENCOUNTER — Other Ambulatory Visit: Payer: Self-pay | Admitting: *Deleted

## 2016-10-22 DIAGNOSIS — Z0000 Encounter for general adult medical examination without abnormal findings: Secondary | ICD-10-CM

## 2016-10-22 DIAGNOSIS — I10 Essential (primary) hypertension: Secondary | ICD-10-CM

## 2016-10-22 NOTE — Telephone Encounter (Signed)
Future orders entered and patient advised.

## 2016-12-24 ENCOUNTER — Other Ambulatory Visit: Payer: Self-pay | Admitting: Family Medicine

## 2016-12-24 MED FILL — LISINOPRIL-HCTZ 20-12.5 MG: 20-12.5 | 30 days supply | Qty: 30 | Fill #0

## 2016-12-30 ENCOUNTER — Telehealth: Payer: Self-pay | Admitting: Family Medicine

## 2016-12-30 ENCOUNTER — Other Ambulatory Visit: Payer: 59

## 2016-12-30 DIAGNOSIS — I10 Essential (primary) hypertension: Secondary | ICD-10-CM

## 2016-12-30 DIAGNOSIS — Z0000 Encounter for general adult medical examination without abnormal findings: Secondary | ICD-10-CM | POA: Diagnosis not present

## 2016-12-30 LAB — COMPREHENSIVE METABOLIC PANEL
AG Ratio: 1.2 (calc) (ref 1.0–2.5)
ALT: 19 U/L (ref 6–29)
AST: 22 U/L (ref 10–35)
Albumin: 4.1 g/dL (ref 3.6–5.1)
Alkaline phosphatase (APISO): 56 U/L (ref 33–115)
BUN: 10 mg/dL (ref 7–25)
CO2: 26 mmol/L (ref 20–32)
CREATININE: 1 mg/dL (ref 0.50–1.10)
Calcium: 9.4 mg/dL (ref 8.6–10.2)
Chloride: 105 mmol/L (ref 98–110)
GLUCOSE: 89 mg/dL (ref 65–99)
Globulin: 3.5 g/dL (calc) (ref 1.9–3.7)
Potassium: 4.4 mmol/L (ref 3.5–5.3)
Sodium: 140 mmol/L (ref 135–146)
Total Bilirubin: 0.4 mg/dL (ref 0.2–1.2)
Total Protein: 7.6 g/dL (ref 6.1–8.1)

## 2016-12-30 LAB — LIPID PANEL
CHOL/HDL RATIO: 5.1 (calc) — AB (ref <5.0)
Cholesterol: 265 mg/dL — ABNORMAL HIGH (ref <200)
HDL: 52 mg/dL (ref >50)
LDL Cholesterol (Calc): 182 mg/dL (calc) — ABNORMAL HIGH
Non-HDL Cholesterol (Calc): 213 mg/dL (calc) — ABNORMAL HIGH (ref <130)
Triglycerides: 164 mg/dL — ABNORMAL HIGH (ref <150)

## 2016-12-30 NOTE — Telephone Encounter (Signed)
Recv'd form from [employer] put in your folder for completion

## 2016-12-31 ENCOUNTER — Other Ambulatory Visit: Payer: Self-pay

## 2016-12-31 NOTE — Telephone Encounter (Signed)
She has CPE next week.  Will do at visit

## 2017-01-03 NOTE — Progress Notes (Signed)
Chief Complaint  Patient presents with  . Annual Exam    nonfasting annual exam, no pap-sees Dr.Lowe. Did not do eye exam just had eye exam with Dr. Peter Garter at [facility]. Still having B/L hip bursitis. Needs forms for FMLa filled out. UA showed 1+ blood-no symptoms.    Candace Walker is a 46 y.o. female who presents for a complete physical.  She has the following concerns:  Needs FMLA forms filled out.   Asking for ibuprofen rx to help with her hip bursitis pain.  It bothers her at night.   Hypertension follow-up: Blood pressures elsewhere are fine when checked at work.120's/70's. Last week it went up to 132/80 last week when stressed about her father's new diagnosis of lung cancer.  Denies dizziness, headaches, chest pain, muscle cramps or edema. Denies side effects of medications.   Allergies and asthma: Doing well on her current regimen of Singulair, Allegra, Flonase and nasal saline spray. Not needing albuterol recently. FMLA forms need to be completed today.  After exposure to perfume or flowers, she needs to take a 30 minute break, albuterol prn (and avoids going back into that particular room for the rest of the shift).  This happens up to 1-2 times/month. Tries to avoid by switching with somebody when she sees a lot of flowers in the patient's room, before any problems.   Acute visit for URI in 05/2016 and sinobronchitis in 03/2016.  Also seen for bilateral shoulder pain and L thumb trigger finger in 03/2016 (prescribed meloxicam).  Thumb is no longer triggering, sometimes just feels a little tight. She was treated with meloxicam, which helped--she would prefer this to ibuprofen, when reminded about this medication (once daily only).  H/o vitamin D deficiency: She is compliant with daily supplement. Last check was 11/2013, level was 37 on the same vitamin.  Hyperlipidemia: This has been diet controlled, she has never taken cholesterol medication.  She has been stressed (since  learning of her father's cancer), eating differently.  Eating more carbs-- breads, muffins, cake.  She has been eating 2 boiled eggs daily for breakfast for the last several months (along with yogurt and a banana).  14 eggs/week. No red meats or cheese, ice cream.  No creamy dressings, sauces, soups, etc. Only change was increase in carbs, and egg intake.   Immunization History  Administered Date(s) Administered  . Influenza Split 10/15/2010, 11/19/2011, 10/31/2013  . Influenza Whole 11/12/2012  . Influenza-Unspecified 11/05/2014, 11/17/2015  . Pneumococcal Polysaccharide-23 04/24/2013  . Tdap 04/24/2013   Yearly flu shots at work [employer]) Last Pap smear: 02/2015 (per patient, sees Dr. Corinna Capra, GYN); pt states done yearly (s/p hysterectomy); no records. She is scheduled for next month. Last mammogram: at Dr. Gregor Hams office (Jan 2017); no records. Scheduled for next month. Last colonoscopy: never; she was referred last year due to FHx of polyps, but she canceled her appointment. It was too expensive.  She will be able to do this next year (changed insurance plans) Last DEXA: never Dentist: twice yearly Ophtho: Yearly Exercise: No exercise x 6 months (when her parents came to stay with her).  Previously went 2 days/week to [employer] (stair climber, elliptical, stretches, weights).--gave up her membership. She hopes to go back, to help with stress.  Past Medical History:  Diagnosis Date  . Allergic rhinitis, cause unspecified   . Arthritis    bursitis in hips, recently dx with avascular necrosis femor head  . Asthma with allergic rhinitis  rarely uses inhaler unless she gets sick  . Essential hypertension, benign   . Mixed hyperlipidemia    no meds, diet controlled  . Positive PPD    h/o BCG vaccine, negative chest xray  . SVD (spontaneous vaginal delivery)    x 2  . Unspecified vitamin D deficiency    Past Surgical History:  Procedure Laterality Date  . ENDOMETRIAL  ABLATION  2007   Dr. Corinna Capra  . LAPAROSCOPIC ASSISTED VAGINAL HYSTERECTOMY N/A 06/12/2013   Performed by Luz Lex, MD at [facility]S  . LAPAROSCOPIC BILATERAL SALPINGECTOMY  06/12/2013   Performed by Luz Lex, MD at [facility]  . TUBAL LIGATION    . WISDOM TOOTH EXTRACTION     right bottom lower tooth   Social History   Socioeconomic History  . Marital status: Divorced    Spouse name: Not on file  . Number of children: 2  . Years of education: Not on file  . Highest education level: Not on file  Social Needs  . Financial resource strain: Not on file  . Food insecurity - worry: Not on file  . Food insecurity - inability: Not on file  . Transportation needs - medical: Not on file  . Transportation needs - non-medical: Not on file  Occupational History  . Occupation: Research scientist (life sciences): [employer]  Tobacco Use  . Smoking status: Never Smoker  . Smokeless tobacco: Never Used  Substance and Sexual Activity  . Alcohol use: Yes    Comment: occasional (once a month)  . Drug use: No  . Sexual activity: Not Currently    Birth control/protection: Surgical    Comment: ablation and tubal ligation and hysterectomy  Other Topics Concern  . Not on file  Social History Narrative   Lives with her son; no pets. Divorced from her husband.  Daughter is in the army, living in [place].   Parents are staying with her (from  [place]), father will start treatment for lung cancer (12/2016).   Family History  Problem Relation Age of Onset  . Diabetes Mother   . Hypertension Mother   . Allergies Mother   . Colon polyps Mother 64  . Heart disease Father 12       MI  . Hyperlipidemia Father   . Hypertension Father   . Cancer Father 6       lung cancer (smoker)  . Lung cancer Father   . Cancer Paternal Grandmother        breast cancer in 32's  . Hypertension Sister    Outpatient Encounter Medications as of 01/04/2017  Medication Sig  . cholecalciferol (VITAMIN D) 1000 UNITS  tablet Take 1,000 Units by mouth daily. Reported on 07/22/2015  . fexofenadine (ALLEGRA) 180 MG tablet Take 180 mg by mouth at bedtime. Reported on 07/22/2015  . fish oil-omega-3 fatty acids 1000 MG capsule Take 3 g by mouth daily. Reported on 07/22/2015  . lisinopril-hydrochlorothiazide (PRINZIDE,ZESTORETIC) 20-12.5 MG tablet Take 1 tablet daily by mouth.  . montelukast (SINGULAIR) 10 MG tablet TAKE 1 TABLET (10 MG TOTAL) BY MOUTH AT BEDTIME.  . [DISCONTINUED] lisinopril-hydrochlorothiazide (PRINZIDE,ZESTORETIC) 20-12.5 MG tablet TAKE 1 TABLET BY MOUTH DAILY.  Marland Kitchen acetaminophen (TYLENOL) 500 MG tablet Take 1,000 mg by mouth every 6 (six) hours as needed.  Marland Kitchen albuterol (PROVENTIL HFA;VENTOLIN HFA) 108 (90 Base) MCG/ACT inhaler Inhale 2 puffs into the lungs every 6 (six) hours as needed for wheezing. (Patient not taking: Reported on 04/13/2016)  .  Diphenhydramine-Phenylephrine (SUDAFED PE DAY & NIGHT PO) Take 2 capsules by mouth every 6 (six) hours as needed.  . meloxicam (MOBIC) 15 MG tablet Take 1 tablet by mouth with food once daily.  Take for up to 2 weeks at a time as needed for bursitis/pain  . [DISCONTINUED] azithromycin (ZITHROMAX) 250 MG tablet Take 2 tablets by mouth on first day, then 1 tablet by mouth on days 2 through 5  . [DISCONTINUED] HYDROcodone-homatropine (HYCODAN) 5-1.5 MG/5ML syrup Take 5 mLs by mouth every 6 (six) hours as needed for cough.  . [DISCONTINUED] ibuprofen (ADVIL,MOTRIN) 800 MG tablet TAKE 1 TABLET BY MOUTH EVERY 8 (EIGHT) HOURS AS NEEDED. USE FOR 1-2 WEEKS AS NEEDED FOR HIP BURSITIS/PAIN (Patient not taking: Reported on 01/04/2017)   No facility-administered encounter medications on file as of 01/04/2017.    Ginger and cumin/turmeric  (grinding, mixes with water BID).  Allergies  Allergen Reactions  . Nexium [Esomeprazole Magnesium] Itching  . Penicillins Hives  . Percocet [Oxycodone-Acetaminophen] Itching    ROS: The patient denies anorexia, fever, headaches, vision  changes, decreased hearing, ear pain, sore throat, breast concerns, chest pain, palpitations, dizziness, syncope, dyspnea on exertion, cough, swelling, nausea, vomiting, diarrhea, constipation, abdominal pain, melena, hematochezia, hematuria, incontinence, dysuria, vaginal bleeding, discharge, odor or itch, genital lesions, numbness, tingling, weakness, tremor, suspicious skin lesions, depression, anxiety, abnormal bleeding or enlarged lymph nodes. No longer having shoulder or knee pain.   +bilateral hip (R worse than left) pain, c/w prior dx of trochanteric bursitis.  She also has pain in her upper, between her shoulder blades, more on the right. Allergies are controlled; breathing is good.    PHYSICAL EXAM:  BP 130/88   Pulse 84   Ht 5\' 8"  (1.727 m)   Wt 233 lb (105.7 kg)   LMP 11/07/2012   BMI 35.43 kg/m   Wt Readings from Last 3 Encounters:  01/04/17 233 lb (105.7 kg)  05/25/16 236 lb 9.6 oz (107.3 kg)  04/13/16 236 lb 9.6 oz (107.3 kg)    General Appearance:   Alert, cooperative, no distress, appears stated age  Head:   Normocephalic, without obvious abnormality, atraumatic  Eyes:   PERRL, conjunctiva/corneas clear, EOM's intact, fundi benign  Ears:   Normal TM's and external ear canals  Nose:  Nares normal, mucosa is mildly edematous, no drainage or sinus tenderness  Throat:  Lips, mucosa, and tongue normal; teeth and gums normal  Neck:  Supple, no lymphadenopathy; thyroid: no enlargement/tenderness/nodules; no carotid bruit or JVD  Back:  Spine nontender, no curvature, ROM normal, no CVAtenderness  Lungs:   Clear to auscultation bilaterally without wheezes, rales orronchi; respirations unlabored  Chest Wall:   No tenderness or deformity  Heart:   Regular rate and rhythm, S1 and S2 normal, no murmur, rub or gallop  Breast Exam:   Deferred to GYN  Abdomen:   Soft, non-tender, nondistended, normoactive bowel sounds, no masses, no  hepatosplenomegaly  Genitalia:   Deferred to GYN     Extremities:  No clubbing, cyanosis or edema. Tender at bilateral trochanteric bursa, R>L  Pulses:  2+ and symmetric all extremities  Skin:  Skin color, texture, turgor normal, no rashes or lesions  Lymph nodes:  Cervical, supraclavicular, and axillary nodes normal  Neurologic:  CNII-XII intact, normal strength, sensation and gait; reflexes 2+ and symmetric throughout   Psych: Normal mood, affect, hygiene and grooming     Chemistry      Component Value Date/Time   NA 140 12/30/2016  1046   K 4.4 12/30/2016 1046   CL 105 12/30/2016 1046   CO2 26 12/30/2016 1046   BUN 10 12/30/2016 1046   CREATININE 1.00 12/30/2016 1046      Component Value Date/Time   CALCIUM 9.4 12/30/2016 1046   ALKPHOS 48 01/02/2016 0943   AST 22 12/30/2016 1046   ALT 19 12/30/2016 1046   BILITOT 0.4 12/30/2016 1046     Fasting glucose 89  Lab Results  Component Value Date   CHOL 265 (H) 12/30/2016   HDL 52 12/30/2016   LDLCALC 135 (H) 01/02/2016   TRIG 164 (H) 12/30/2016   CHOLHDL 5.1 (H) 12/30/2016   This is significantly higher than last year, 12/2015:  Cholesterol <200 mg/dL 219 Abnormally high      Triglycerides <150 mg/dL 147     HDL >50 mg/dL 55     Total CHOL/HDL Ratio <5.0 Ratio 4.0   VLDL <30 mg/dL 29   LDL Cholesterol <100 mg/dL 135 Abnormally high      ASSESSMENT/PLAN:  Annual physical exam - Plan: POCT Urinalysis DIP (Proadvantage Device)  Mixed hyperlipidemia - significantly higher LDL, likely related to change in diet (14 eggs/wk); Diet discussed; return for repeat lipids in 76mos - Plan: Lipid panel  Essential hypertension, benign - controlled; slightly higher with more stress, and lack of exercise.  Exercise, stress reduction, weight loss - Plan: lisinopril-hydrochlorothiazide (PRINZIDE,ZESTORETIC) 20-12.5 MG tablet  Asthma with allergic rhinitis, unspecified asthma  severity, uncomplicated - Plan: Spirometry with Graph  Family history of colonic polyps  Trochanteric bursitis, unspecified laterality - use mobic rather than motrin (once daily, better compliance). Risks/side effects reviewed. f/u for injection if not better after 2 weeks - Plan: meloxicam (MOBIC) 15 MG tablet  FMLA form filled out.  Discussed monthly self breast exams and yearly mammograms; at least 30 minutes of aerobic activity at least 5 days/week, weight-bearing exercise at least 2x/week; proper sunscreen use reviewed; healthy diet, including goals of calcium and vitamin D intake and alcohol recommendations (less than or equal to 1 drink/day) reviewed; regular seatbelt use; changing batteries in smoke detectors. Immunization recommendations discussed--UTD. Continue yearly flu shots. Colonoscopy recommendations reviewed--she was referred for colonoscopy last year due to her mother having many colon polyps; will call to reschedule when affordable (with change in insurance).  Need mammo and pap smears/notes from GYN, Dr. Corinna Capra (scheduled for next month)  meloxicam for hip bursitis  Please take the meloxicam once daily with food for up to 10-14 days as needed for treatment of hip bursitis.  (rather than just taking it at bedtime).  After 2 weeks, if your pain isn't better, return for possible cortisone injection. Be sure not to take other over-the-counter anti-inflammatories (or rx ibuprofen) while taking the meloxicam prescription. Cut back the dose if it bothers your stomach.  Take it with food.  Your cholesterol was MUCH higher than previously, likely due to your recent change in det, eating 2 eggs daily.  Try and limit egg yolks to just 2-3/week.  The egg whites provide a lot of protein, with less cholesterol.    Recheck fasting cholesterol panel in 3-4 months.  This should be a lot better when you cut out the egg yolks.

## 2017-01-04 ENCOUNTER — Encounter: Payer: Self-pay | Admitting: Family Medicine

## 2017-01-04 ENCOUNTER — Ambulatory Visit (INDEPENDENT_AMBULATORY_CARE_PROVIDER_SITE_OTHER): Payer: 59 | Admitting: Family Medicine

## 2017-01-04 VITALS — BP 130/88 | HR 84 | Ht 68.0 in | Wt 233.0 lb

## 2017-01-04 DIAGNOSIS — M7060 Trochanteric bursitis, unspecified hip: Secondary | ICD-10-CM

## 2017-01-04 DIAGNOSIS — Z0000 Encounter for general adult medical examination without abnormal findings: Secondary | ICD-10-CM

## 2017-01-04 DIAGNOSIS — I10 Essential (primary) hypertension: Secondary | ICD-10-CM

## 2017-01-04 DIAGNOSIS — E782 Mixed hyperlipidemia: Secondary | ICD-10-CM

## 2017-01-04 DIAGNOSIS — Z8371 Family history of colonic polyps: Secondary | ICD-10-CM | POA: Diagnosis not present

## 2017-01-04 DIAGNOSIS — J45909 Unspecified asthma, uncomplicated: Secondary | ICD-10-CM | POA: Diagnosis not present

## 2017-01-04 LAB — POCT URINALYSIS DIP (PROADVANTAGE DEVICE)
BILIRUBIN UA: NEGATIVE mg/dL
Bilirubin, UA: NEGATIVE
Glucose, UA: NEGATIVE mg/dL
Leukocytes, UA: NEGATIVE
Nitrite, UA: NEGATIVE
PH UA: 6 (ref 5.0–8.0)
PROTEIN UA: NEGATIVE mg/dL
SPECIFIC GRAVITY, URINE: 1.025
UUROB: NEGATIVE

## 2017-01-04 MED ORDER — LISINOPRIL-HYDROCHLOROTHIAZIDE 20-12.5 MG PO TABS: 1.0000 | ORAL_TABLET | Freq: Every day | ORAL | 3 refills | Status: DC

## 2017-01-04 MED ORDER — MELOXICAM 15 MG PO TABS: ORAL_TABLET | ORAL | 0 refills | Status: DC

## 2017-01-04 MED FILL — MELOXICAM 15 MG TABLET: 15 | 30 days supply | Qty: 30 | Fill #0

## 2017-01-04 NOTE — Patient Instructions (Signed)
  HEALTH MAINTENANCE RECOMMENDATIONS:  It is recommended that you get at least 30 minutes of aerobic exercise at least 5 days/week (for weight loss, you may need as much as 60-90 minutes). This can be any activity that gets your heart rate up. This can be divided in 10-15 minute intervals if needed, but try and build up your endurance at least once a week.  Weight bearing exercise is also recommended twice weekly.  Eat a healthy diet with lots of vegetables, fruits and fiber.  "Colorful" foods have a lot of vitamins (ie green vegetables, tomatoes, red peppers, etc).  Limit sweet tea, regular sodas and alcoholic beverages, all of which has a lot of calories and sugar.  Up to 1 alcoholic drink daily may be beneficial for women (unless trying to lose weight, watch sugars).  Drink a lot of water.  Calcium recommendations are 1200-1500 mg daily (1500 mg for postmenopausal women or women without ovaries), and vitamin D 1000 IU daily.  This should be obtained from diet and/or supplements (vitamins), and calcium should not be taken all at once, but in divided doses.  Monthly self breast exams and yearly mammograms for women over the age of 81 is recommended.  Sunscreen of at least SPF 30 should be used on all sun-exposed parts of the skin when outside between the hours of 10 am and 4 pm (not just when at beach or pool, but even with exercise, golf, tennis, and yard work!)  Use a sunscreen that says "broad spectrum" so it covers both UVA and UVB rays, and make sure to reapply every 1-2 hours.  Remember to change the batteries in your smoke detectors when changing your clock times in the spring and fall.  Use your seat belt every time you are in a car, and please drive safely and not be distracted with cell phones and texting while driving.   Please take the meloxicam once daily with food for up to 10-14 days as needed for treatment of hip bursitis.  (rather than just taking it at bedtime).  After 2 weeks, if  your pain isn't better, return for possible cortisone injection. Be sure not to take other over-the-counter anti-inflammatories (or rx ibuprofen) while taking the meloxicam prescription. Cut back the dose if it bothers your stomach.  Take it with food.  Your cholesterol was MUCH higher than previously, likely due to your recent change in det, eating 2 eggs daily.  Try and limit egg yolks to just 2-3/week.  The egg whites provide a lot of protein, with less cholesterol.    Recheck fasting cholesterol panel in 3-4 months.  This should be a lot better when you cut out the egg yolks.  Please call [facility] to reschedule your visit when you are able to.  Resume regular exercise--at least 150 minutes of cardio each week.

## 2017-01-05 MED FILL — MONTELUKAST SOD 10 MG TAB: 10 | 90 days supply | Qty: 90 | Fill #0

## 2017-01-28 DIAGNOSIS — Z6835 Body mass index (BMI) 35.0-35.9, adult: Secondary | ICD-10-CM | POA: Diagnosis not present

## 2017-01-28 DIAGNOSIS — Z01419 Encounter for gynecological examination (general) (routine) without abnormal findings: Secondary | ICD-10-CM | POA: Diagnosis not present

## 2017-01-28 DIAGNOSIS — Z1231 Encounter for screening mammogram for malignant neoplasm of breast: Secondary | ICD-10-CM | POA: Diagnosis not present

## 2017-01-28 LAB — HM PAP SMEAR: HM Pap smear: NEGATIVE

## 2017-02-01 LAB — HM MAMMOGRAPHY

## 2017-02-01 MED FILL — LISINOPRIL-HCTZ 20-12.5 MG: 20-12.5 | 90 days supply | Qty: 90 | Fill #0

## 2017-02-18 ENCOUNTER — Telehealth: Payer: Self-pay | Admitting: Family Medicine

## 2017-02-18 NOTE — Telephone Encounter (Signed)
Received requested mammogram [facility] pap from [facility]. Sending back for review.

## 2017-03-03 ENCOUNTER — Encounter: Payer: Self-pay | Admitting: *Deleted

## 2017-03-25 ENCOUNTER — Ambulatory Visit (INDEPENDENT_AMBULATORY_CARE_PROVIDER_SITE_OTHER): Payer: 59

## 2017-03-25 ENCOUNTER — Ambulatory Visit (INDEPENDENT_AMBULATORY_CARE_PROVIDER_SITE_OTHER): Payer: 59 | Admitting: Podiatry

## 2017-03-25 ENCOUNTER — Other Ambulatory Visit: Payer: Self-pay | Admitting: Podiatry

## 2017-03-25 DIAGNOSIS — M722 Plantar fascial fibromatosis: Secondary | ICD-10-CM

## 2017-03-25 DIAGNOSIS — M79672 Pain in left foot: Secondary | ICD-10-CM

## 2017-03-25 DIAGNOSIS — M2041 Other hammer toe(s) (acquired), right foot: Secondary | ICD-10-CM

## 2017-03-25 DIAGNOSIS — M79671 Pain in right foot: Secondary | ICD-10-CM

## 2017-03-25 MED ORDER — TRIAMCINOLONE ACETONIDE 10 MG/ML IJ SUSP
10.0000 mg | Freq: Once | INTRAMUSCULAR | Status: AC
  Administered 2017-03-25: 10 mg

## 2017-03-25 MED ORDER — DICLOFENAC SODIUM 75 MG PO TBEC: 75.0000 mg | DELAYED_RELEASE_TABLET | Freq: Two times a day (BID) | ORAL | 2 refills | Status: DC

## 2017-03-25 MED FILL — DICLOFENAC SODIUM 75 MG TAB: 75 | 25 days supply | Qty: 50 | Fill #0

## 2017-03-25 NOTE — Progress Notes (Signed)
dg 

## 2017-03-25 NOTE — Progress Notes (Signed)
Subjective:   Patient ID: Candace Walker, female   DOB: 47 y.o.   MRN: [id number]   HPI Patient presents with exquisite discomfort plantar heel region left over righ and states she does not remember specific injury.  Also complains of hammertoe deformity fifth digit right that she states is very sore and she is tried wider shoes and padding and would like to have it corrected   Review of Systems  All other systems reviewed and are negative.       Objective:  Physical Exam  Constitutional: She appears well-developed and well-nourished.  Cardiovascular: Intact distal pulses.  Pulmonary/Chest: Effort normal.  Musculoskeletal: Normal range of motion.  Neurological: She is alert.  Skin: Skin is warm.  Nursing note and vitals reviewed.   Neurovascular status found to be intact muscle strength adequate range of motion within normal limits with visit discomfort plantar aspect heel left over right with inflammation fluid and keratotic lesion with pain and bone prominence fifth digit right.  Patient is found to have good digital perfusion well oriented x3 and patient does not smoke and does like to be active     Assessment:  Acute plantar fasciitis left over right with hammertoe deformity fifth right with keratotic lesion     Plan:  H&P all conditions reviewed discussed and patient wants arthroplasty will discuss that at next visit today we will focus on the heel.  I did discuss plantar fasciitis and I injected the plantar fascial left 3 mg Kenalog 5 mg Xylocaine applied fascial brace and placed on diclofenac 75 mg twice daily.  Patient was given guidance on arthroplasty for the fifth digit right  X-rays indicate small spur formation with moderate depression of the arch and no indication of stress fracture or advanced arthritis

## 2017-03-25 NOTE — Patient Instructions (Signed)

## 2017-03-29 ENCOUNTER — Other Ambulatory Visit: Payer: 59

## 2017-03-29 DIAGNOSIS — E782 Mixed hyperlipidemia: Secondary | ICD-10-CM | POA: Diagnosis not present

## 2017-03-30 LAB — LIPID PANEL
CHOL/HDL RATIO: 5.5 ratio — AB (ref 0.0–4.4)
Cholesterol, Total: 271 mg/dL — ABNORMAL HIGH (ref 100–199)
HDL: 49 mg/dL (ref >39)
LDL Calculated: 188 mg/dL — ABNORMAL HIGH (ref 0–99)
Triglycerides: 171 mg/dL — ABNORMAL HIGH (ref 0–149)
VLDL CHOLESTEROL CAL: 34 mg/dL (ref 5–40)

## 2017-04-06 ENCOUNTER — Other Ambulatory Visit: Payer: 59

## 2017-04-08 ENCOUNTER — Ambulatory Visit (INDEPENDENT_AMBULATORY_CARE_PROVIDER_SITE_OTHER): Payer: 59 | Admitting: Podiatry

## 2017-04-08 ENCOUNTER — Encounter: Payer: Self-pay | Admitting: Podiatry

## 2017-04-08 ENCOUNTER — Ambulatory Visit: Payer: 59 | Admitting: Podiatry

## 2017-04-08 DIAGNOSIS — M2040 Other hammer toe(s) (acquired), unspecified foot: Secondary | ICD-10-CM

## 2017-04-08 NOTE — Patient Instructions (Signed)
Pre-Operative Instructions  Congratulations, you have decided to take an important step towards improving your quality of life.  You can be assured that the doctors and staff at [facility] will be with you every step of the way.  Here are some important things you should know:  1. Plan to be at the surgery center/hospital at least 1 (one) hour prior to your scheduled time, unless otherwise directed by the surgical center/hospital staff.  You must have a responsible adult accompany you, remain during the surgery and drive you home.  Make sure you have directions to the surgical center/hospital to ensure you arrive on time. 2. If you are having surgery at [facility] or [facility], you will need a copy of your medical history and physical form from your family physician within one month prior to the date of surgery. We will give you a form for your primary physician to complete.  3. We make every effort to accommodate the date you request for surgery.  However, there are times where surgery dates or times have to be moved.  We will contact you as soon as possible if a change in schedule is required.   4. No aspirin/ibuprofen for one week before surgery.  If you are on aspirin, any non-steroidal anti-inflammatory medications (Mobic, Aleve, Ibuprofen) should not be taken seven (7) days prior to your surgery.  You make take Tylenol for pain prior to surgery.  5. Medications - If you are taking daily heart and blood pressure medications, seizure, reflux, allergy, asthma, anxiety, pain or diabetes medications, make sure you notify the surgery center/hospital before the day of surgery so they can tell you which medications you should take or avoid the day of surgery. 6. No food or drink after midnight the night before surgery unless directed otherwise by surgical center/hospital staff. 7. No alcoholic beverages 24-hours prior to surgery.  No smoking 24-hours prior or 24-hours after  surgery. 8. Wear loose pants or shorts. They should be loose enough to fit over bandages, boots, and casts. 9. Don't wear slip-on shoes. Sneakers are preferred. 10. Bring your boot with you to the surgery center/hospital.  Also bring crutches or a walker if your physician has prescribed it for you.  If you do not have this equipment, it will be provided for you after surgery. 11. If you have not been contacted by the surgery center/hospital by the day before your surgery, call to confirm the date and time of your surgery. 12. Leave-time from work may vary depending on the type of surgery you have.  Appropriate arrangements should be made prior to surgery with your employer. 13. Prescriptions will be provided immediately following surgery by your doctor.  Fill these as soon as possible after surgery and take the medication as directed. Pain medications will not be refilled on weekends and must be approved by the doctor. 14. Remove nail polish on the operative foot and avoid getting pedicures prior to surgery. 15. Wash the night before surgery.  The night before surgery wash the foot and leg well with water and the antibacterial soap provided. Be sure to pay special attention to beneath the toenails and in between the toes.  Wash for at least three (3) minutes. Rinse thoroughly with water and dry well with a towel.  Perform this wash unless told not to do so by your physician.  Enclosed: 1 Ice pack (please put in freezer the night before surgery)   1 Hibiclens skin cleaner     Pre-op instructions  If you have any questions regarding the instructions, please do not hesitate to call our office.  [place]: [address], [place], [place] [address] -- [phone]  [place]: [address] [place], [place] [address] -- [phone]  [place]: [address]  , [place] [address] -- [phone]  [place]: [address], [address], [place], [place] [address] -- [phone]  Website:  [email or web address] 

## 2017-04-08 NOTE — Progress Notes (Signed)
Subjective:   Patient ID: Candace Walker, female   DOB: 47 y.o.   MRN: [id number]   HPI Patient presents stating the heel is doing well with my medication and the brace really helping   ROS      Objective:  Physical Exam  Neurovascular status intact with patient's heel doing much better with chronic digital deformity digit 5 right over left with keratotic lesion head of proximal phalanx     Assessment:  Improving plantar fasciitis right with hammertoe deformity fifth digit right over left     Plan:  H&P condition and x-rays reviewed with patient.  For the fasciitis we will do physical therapy anti-inflammatories and continued brace usage with possible long-term orthotics and for the hammertoe she wants the right one fixed.  I allowed her to read the consent form going over alternative treatments complications associated with surgery and today I went ahead and I allowed her to sign consent form going over the recovery the procedure and risk and patient wants surgery signed consent form is scheduled for arthroplasty procedure.  Encouraged to call with any questions prior to procedure

## 2017-04-09 ENCOUNTER — Ambulatory Visit: Payer: 59 | Admitting: Podiatry

## 2017-04-20 MED FILL — DICLOFENAC SODIUM 75 MG TAB: 75 | 25 days supply | Qty: 50 | Fill #1

## 2017-04-29 ENCOUNTER — Telehealth: Payer: Self-pay | Admitting: Family Medicine

## 2017-04-29 DIAGNOSIS — M7060 Trochanteric bursitis, unspecified hip: Secondary | ICD-10-CM

## 2017-04-29 MED ORDER — MELOXICAM 15 MG PO TABS: ORAL_TABLET | ORAL | 0 refills | Status: DC

## 2017-04-29 MED FILL — MELOXICAM 15 MG TABLET: 15 | 30 days supply | Qty: 30 | Fill #0

## 2017-04-29 NOTE — Telephone Encounter (Signed)
Last filled in November.  [place] for rf #[id number], sent in.

## 2017-04-29 NOTE — Telephone Encounter (Signed)
Pt is having bursitis pain in both hips and right arm again so she is requesting a refill on Meloxicam 15 mg

## 2017-04-30 ENCOUNTER — Telehealth: Payer: Self-pay | Admitting: *Deleted

## 2017-04-30 MED FILL — LISINOPRIL-HCTZ 20-12.5 MG: 20-12.5 | 90 days supply | Qty: 90 | Fill #1

## 2017-04-30 NOTE — Telephone Encounter (Signed)
Patient stated she had to hold off on having surgery at this time due to advise of her primary care doctor.  I called and left a message for Caren Griffins at [facility] to cancel patient's surgery scheduled for April 30.

## 2017-05-06 NOTE — Telephone Encounter (Signed)
What was she having done

## 2017-05-06 NOTE — Telephone Encounter (Signed)
Hammer Toe Repair 5th B/L

## 2017-05-10 NOTE — Telephone Encounter (Signed)
Could also be done in office if they are concerned about anethesia

## 2017-05-10 NOTE — Telephone Encounter (Signed)
I am calling to inform you that Dr. Paulla Dolly said he can perform your surgery here in the office if you have concerns about being under anesthesia.  He will just numb the areas up that surgery will be performed on only.  "Okay, let me consider it and call you back."

## 2017-05-12 ENCOUNTER — Ambulatory Visit (INDEPENDENT_AMBULATORY_CARE_PROVIDER_SITE_OTHER): Payer: 59 | Admitting: Family Medicine

## 2017-05-12 ENCOUNTER — Encounter: Payer: Self-pay | Admitting: Family Medicine

## 2017-05-12 VITALS — BP 128/82 | HR 83 | Temp 98.4°F | Wt 241.6 lb

## 2017-05-12 DIAGNOSIS — J309 Allergic rhinitis, unspecified: Secondary | ICD-10-CM | POA: Diagnosis not present

## 2017-05-12 DIAGNOSIS — J45909 Unspecified asthma, uncomplicated: Secondary | ICD-10-CM | POA: Diagnosis not present

## 2017-05-12 MED ORDER — ALBUTEROL SULFATE (2.5 MG/3ML) 0.083% IN NEBU: 2.5000 mg | INHALATION_SOLUTION | Freq: Once | RESPIRATORY_TRACT | Status: DC

## 2017-05-12 NOTE — Addendum Note (Signed)
Addended by: Elyse Jarvis on: 05/12/2017 04:47 PM   Modules accepted: Orders

## 2017-05-12 NOTE — Patient Instructions (Signed)
If you continue to have trouble with asthma, we might need to place you on a steroid inhaler so keep me informed as to how you are doing

## 2017-05-12 NOTE — Progress Notes (Signed)
   Subjective:    Patient ID: Candace Walker, female    DOB: [birth year], 47 y.o.   MRN: [id number]  HPI Last night she started having difficulty with wheezing and did use her inhaler.  She used it again this morning but continued to have difficulty and came here for follow-up.  She does have 2 inhalers but neglected to take it along with her to work.  No fever, chills, coughing, sore throat, earache.  She does have underlying allergies and has been using Singulair as well as an OTC antihistamine.   Review of Systems     Objective:   Physical Exam Alert and in no distress. Tympanic membranes and canals are normal. Pharyngeal area is normal. Neck is supple without adenopathy or thyromegaly. Cardiac exam shows a regular sinus rhythm without murmurs or gallops. Lungs are clear to auscultation.        Assessment & Plan:  Allergic rhinitis, unspecified seasonality, unspecified trigger  Asthma with allergic rhinitis without complication, unspecified asthma severity  I will give her a nebulizer treatment. Recommend she keep her albuterol inhaler with her at all times.  Expressed the fact that if she continues have difficulty with her springtime allergies and asthma, might need to possibly place her on a steroid inhaler.

## 2017-05-13 ENCOUNTER — Other Ambulatory Visit: Payer: Self-pay

## 2017-05-13 ENCOUNTER — Encounter: Payer: Self-pay | Admitting: Family Medicine

## 2017-05-13 ENCOUNTER — Ambulatory Visit (INDEPENDENT_AMBULATORY_CARE_PROVIDER_SITE_OTHER): Payer: 59 | Admitting: Family Medicine

## 2017-05-13 ENCOUNTER — Telehealth: Payer: Self-pay | Admitting: Family Medicine

## 2017-05-13 ENCOUNTER — Other Ambulatory Visit: Payer: Self-pay | Admitting: Family Medicine

## 2017-05-13 VITALS — BP 120/82 | HR 80 | Temp 97.8°F | Ht 68.0 in | Wt 238.0 lb

## 2017-05-13 DIAGNOSIS — J309 Allergic rhinitis, unspecified: Secondary | ICD-10-CM | POA: Diagnosis not present

## 2017-05-13 DIAGNOSIS — J45909 Unspecified asthma, uncomplicated: Secondary | ICD-10-CM | POA: Diagnosis not present

## 2017-05-13 DIAGNOSIS — J029 Acute pharyngitis, unspecified: Secondary | ICD-10-CM | POA: Diagnosis not present

## 2017-05-13 LAB — POCT RAPID STREP A (OFFICE): RAPID STREP A SCREEN: NEGATIVE

## 2017-05-13 MED ORDER — PREDNISONE 20 MG PO TABS
20.0000 mg | ORAL_TABLET | Freq: Two times a day (BID) | ORAL | 0 refills | Status: DC
Start: 2017-05-13 — End: 2018-01-05

## 2017-05-13 MED FILL — predniSONE 20 MG TABS: 20 | 5 days supply | Qty: 10 | Fill #0

## 2017-05-13 MED FILL — VENTOLIN HFA 90 MCG INHALER: 108 (90 BAS | 25 days supply | Qty: 18 | Fill #0

## 2017-05-13 MED FILL — MONTELUKAST SOD 10 MG TAB: 10 | 90 days supply | Qty: 90 | Fill #0

## 2017-05-13 NOTE — Telephone Encounter (Signed)
Pt called and stated that she saw JCL yesterday and has had some changed overnight. She states that her wheezing is better. But now her throat is very bad and she is completely stopped up. Please advise pt at [phone]. Pt uses [facility].

## 2017-05-13 NOTE — Telephone Encounter (Signed)
Call pt and she say throat is on fire and congestions. Pt says she is using nasal spray this am . Pt will com in today for strep screen. Candace Walker

## 2017-05-13 NOTE — Telephone Encounter (Signed)
Just check with all the symptoms that she is having we might need to have her come in for strep screen

## 2017-05-13 NOTE — Patient Instructions (Addendum)
  I think you have flare of allergies, cannot rule out virus or early sinus infection. I recommend that you continue to use the flonase (it can take up to 10 days to have its full effect--remember to use just GENTLE sniffs).  I also recommend using mucinex (guaifenesin)--an expectorant to keep the mucus/phlegm thin, and help with your chest congestion and sinus pressure. I also recommend doing sinus rinses (sinus rinse kit or neti-pot). You may continue to use sudafed if needed for sinus pain (if blood pressure is good).  Be sure to look at the color of the phlegm, and if it is discolored (yellow-green), getting worse rather than better, please contact us to start an antibiotic.  I'm sending in a prescription for prednisone to take 1 pill twice daily to help with allergies and asthma--should help with your sinus pressure, drainage and chest tightness/wheezing. If you prefer to hold off on starting this until tomorrow, to see if you can make yourself feel better with the above measures, that would be okay. But if no better, or if worse, be sure to take the full 5 day course.   Do not take the meloxicam while taking prednisone (they can both bother your stomach, and the prednisone should also help with bursitis, so both aren't needed.

## 2017-05-13 NOTE — Telephone Encounter (Signed)
Is this okay to refill? 

## 2017-05-13 NOTE — Progress Notes (Signed)
Chief Complaint  Patient presents with  . Cough    brought up a lot of phlegm last night, unsure of color of mucus .Wheezing a lot. Had neb here yesterday.    She was seen yesterday by Dr. Redmond School after developing wheezing/shortness of breath the night before.   She was treated with nebulizer in office yesterday.she also used inhaler last night and this morning with good results.  Last night she spit up a lot of mucus (didn't look at color), very thick. "I'm just tired". Hasn't slept in 2 nights, the sound of wheezing kept her up (hearing the actual wheezing, not due to feeling short of breath, is a light sleeper).  Throat was very sore this morning, better now.  +sniffles/sneezing x 2 days. She just started using Flonase this morning (she only uses seasonally, hadn't started yet).   PMH, PSH, SH reviewed  Outpatient Encounter Medications as of 05/13/2017  Medication Sig  . cholecalciferol (VITAMIN D) 1000 UNITS tablet Take 1,000 Units by mouth daily. Reported on 07/22/2015  . Diphenhydramine-Phenylephrine (SUDAFED PE DAY & NIGHT PO) Take 2 capsules by mouth every 6 (six) hours as needed.  . fexofenadine (ALLEGRA) 180 MG tablet Take 180 mg by mouth at bedtime. Reported on 07/22/2015  . fish oil-omega-3 fatty acids 1000 MG capsule Take 3 g by mouth daily. Reported on 07/22/2015  . lisinopril-hydrochlorothiazide (PRINZIDE,ZESTORETIC) 20-12.5 MG tablet Take 1 tablet daily by mouth.  . meloxicam (MOBIC) 15 MG tablet Take 1 tablet by mouth with food once daily.  Take for up to 2 weeks at a time as needed for bursitis/pain  . montelukast (SINGULAIR) 10 MG tablet TAKE 1 TABLET (10 MG TOTAL) BY MOUTH AT BEDTIME.  . VENTOLIN HFA 108 (90 Base) MCG/ACT inhaler INHALE 2 PUFFS INTO THE LUNGS EVERY 6 HOURS AS NEEDED FOR WHEEZING  . acetaminophen (TYLENOL) 500 MG tablet Take 1,000 mg by mouth every 6 (six) hours as needed.  . predniSONE (DELTASONE) 20 MG tablet Take 1 tablet (20 mg total) by mouth 2 (two) times  daily with a meal.  . [DISCONTINUED] diclofenac (VOLTAREN) 75 MG EC tablet Take 1 tablet (75 mg total) by mouth 2 (two) times daily. (Patient not taking: Reported on 05/12/2017)   Facility-Administered Encounter Medications as of 05/13/2017  Medication  . albuterol (PROVENTIL) (2.5 MG/3ML) 0.083% nebulizer solution 2.5 mg   (prednisone rx'd today, not taking prior to visit)  Allergies  Allergen Reactions  . Nexium [Esomeprazole Magnesium] Itching  . Penicillins Hives  . Percocet [Oxycodone-Acetaminophen] Itching    ROS: Some chills.  No fevers. No nausea, vomiting, diarrhea, rashes. No sick contacts. No chest pain. Taking meloxicam for hip bursitis. See HPI   PHYSICAL EXAM:  BP 120/82   Pulse 80   Temp 97.8 F (36.6 C) (Tympanic)   Ht 5' 8"  (1.727 m)   Wt 238 lb (108 kg)   LMP 11/07/2012   SpO2 99%   BMI 36.19 kg/m   Well developed, pleasant female, with some throat clearing, speaking comfortably, in no distress.  Appears somewhat tired HEENT: PERRL, EOMI, conjunctiva and sclera are clear. TM's and EAC's normal. Nasal mucosa is mod edematous, mild erythema, no drainage or purulence. She is mildly tender over maxillary sinuses. OP: white spot noted on the left tonsil, but otherwise tonsils appear normal--no erythema, not significantly enlarged. Rest of OP is normal. Neck: no lymphadenopathy or mass Heart: regular rate and rhythm Lungs: clear bilaterally.  Fair air movement, no wheezes, rales, ronchi Extremities:  no edema Skin: normal turgor, no rash Neuro: alert and oriented, cranial nerves intact, normal gait, strength  Rapid strep: negative   ASSESSMENT/PLAN:  Allergic rhinitis, unspecified seasonality, unspecified trigger - continue flonase. Given severity of flare (with asthma), will treat with short course of steroids. Risks/side effects reviewed - Plan: predniSONE (DELTASONE) 20 MG tablet  Asthma with allergic rhinitis without complication, unspecified asthma  severity - treat with short course of steroids; continue singulair and albuterol prn - Plan: predniSONE (DELTASONE) 20 MG tablet  Sore throat - suspect related to postnasal drainage.  mucus is thick--encouraged use of mucinex. reviewed s/sx of infection and to call/return if develops - Plan: Rapid Strep A   I think you have flare of allergies, cannot rule out virus or early sinus infection. I recommend that you continue to use the flonase (it can take up to 10 days to have its full effect--remember to use just GENTLE sniffs).  I also recommend using mucinex (guaifenesin)--an expectorant to keep the mucus/phlegm thin, and help with your chest congestion and sinus pressure. I also recommend doing sinus rinses (sinus rinse kit or neti-pot). You may continue to use sudafed if needed for sinus pain (if blood pressure is good).  Be sure to look at the color of the phlegm, and if it is discolored (yellow-green), getting worse rather than better, please contact us to start an antibiotic.  I'm sending in a prescription for prednisone to take 1 pill twice daily to help with allergies and asthma--should help with your sinus pressure, drainage and chest tightness/wheezing. If you prefer to hold off on starting this until tomorrow, to see if you can make yourself feel better with the above measures, that would be okay. But if no better, or if worse, be sure to take the full 5 day course.  Do not take the meloxicam while taking prednisone (they can both bother your stomach, and the prednisone should also help with bursitis, so both aren't needed.

## 2017-05-17 ENCOUNTER — Telehealth: Payer: Self-pay | Admitting: Family Medicine

## 2017-05-17 MED ORDER — AZITHROMYCIN 250 MG PO TABS: ORAL_TABLET | ORAL | 0 refills | Status: DC

## 2017-05-17 MED FILL — AZITHROMYCIN 250 MG TABLET: 250 | 5 days supply | Qty: 6 | Fill #0

## 2017-05-17 NOTE — Telephone Encounter (Signed)
Patient stated lots of disolored thick yellow mucus and some low grade fever over the weekend. zpak sent.

## 2017-05-17 NOTE — Telephone Encounter (Signed)
She was seen 3/28 and had only started flonase that morning . This is what she was advised: "I think you have flare of allergies, cannot rule out virus or early sinus infection. I recommend that you continue to use the flonase (it can take up to 10 days to have its full effect--remember to use just GENTLE sniffs).  I also recommend using mucinex (guaifenesin)--an expectorant to keep the mucus/phlegm thin, and help with your chest congestion and sinus pressure. I also recommend doing sinus rinses (sinus rinse kit or neti-pot). You may continue to use sudafed if needed for sinus pain (if blood pressure is good).  Be sure to look at the color of the phlegm, and if it is discolored (yellow-green), getting worse rather than better, please contact us to start an antibiotic."  So please see if she is having any discolored mucus, fever. If so, okay for zpak.  If clear-white, then continue with mucinex, sinus rinses, etc as stated above

## 2017-05-17 NOTE — Telephone Encounter (Signed)
Pt requesting antibiotic for congestion in nose and face, face hurts from congestion. Chest heaviness is a little better. Pt said Dr. Tomi Bamberger told her to call back to get antibiotic if she did not improve.

## 2017-06-22 MED FILL — DICLOFENAC SODIUM 75 MG TAB: 75 | 25 days supply | Qty: 50 | Fill #2

## 2017-06-24 ENCOUNTER — Other Ambulatory Visit: Payer: Self-pay

## 2017-07-05 ENCOUNTER — Telehealth: Payer: Self-pay | Admitting: Family Medicine

## 2017-07-05 NOTE — Telephone Encounter (Signed)
Pt called stating that she needed to pay the $25 form completion fee for completing her FMLA form. She left payment information. Do we know for sure that the pt will be charged the fee? Has the form been completed and should we go ahead and process the fee payment now?

## 2017-07-15 ENCOUNTER — Telehealth: Payer: Self-pay | Admitting: Family Medicine

## 2017-07-15 ENCOUNTER — Telehealth: Payer: Self-pay | Admitting: *Deleted

## 2017-07-15 DIAGNOSIS — Z0279 Encounter for issue of other medical certificate: Secondary | ICD-10-CM

## 2017-07-15 NOTE — Telephone Encounter (Signed)
Pt called and requested FMLA papers be mailed to her. Mailed 07/15/2017.

## 2017-07-15 NOTE — Telephone Encounter (Signed)
Spoke with patient and explained that there was not any documentation in chart about her FMLA payment but we had an agreement. I called to ask for permission to charge her card the $25 and she said absolutely. She has now paid.

## 2017-08-04 MED FILL — MONTELUKAST SOD 10 MG TAB: 10 | 90 days supply | Qty: 90 | Fill #1

## 2017-08-04 MED FILL — LISINOPRIL-HCTZ 20-12.5 MG: 20-12.5 | 90 days supply | Qty: 90 | Fill #2

## 2017-08-30 ENCOUNTER — Other Ambulatory Visit: Payer: Self-pay | Admitting: *Deleted

## 2017-08-30 ENCOUNTER — Telehealth: Payer: Self-pay | Admitting: Family Medicine

## 2017-08-30 MED ORDER — IBUPROFEN 800 MG PO TABS: 800.0000 mg | ORAL_TABLET | Freq: Three times a day (TID) | ORAL | 0 refills | Status: DC | PRN

## 2017-08-30 MED FILL — IBUPROFEN 800 MG TAB: 800 | 20 days supply | Qty: 60 | Fill #0

## 2017-08-30 NOTE — Telephone Encounter (Signed)
Pt called and stated that her bursitis is flaring up. She is requesting something be sent in for her. Pt uses [facility]. Pt states she has discussed this at appt and doesn't think she needs to come in. Pt can be reached at [phone].

## 2017-08-30 NOTE — Telephone Encounter (Signed)
  Pt called back to check on status States she had requested rx be sent in for muscle relaxer and ibuprofen for her bursitis  Patient would like to be notified when sent in   [facility] outpatient pharm[facility]ay

## 2017-08-30 NOTE — Telephone Encounter (Signed)
Ok to refill ibuprofen 800mg  as previously ordered for bursitis. Original rx request was put on wrong pt's chart as reason for delay. Muscle relaxants are NOT used for bursitis. Can schedule OV if not improving.  Let pt know when done

## 2017-08-30 NOTE — Telephone Encounter (Signed)
rx for ibuprofen sent to [facility].

## 2017-09-02 ENCOUNTER — Telehealth: Payer: Self-pay | Admitting: Family Medicine

## 2017-09-02 NOTE — Telephone Encounter (Signed)
Pt called and is requesting a muscle relaxer  For her bursitis,  states that the ibuprofen is effecting her joints, states she has told you before that she can not take ibuprofen,because of this reason pt uses [place], [place] - [address] [facility]. And pt can be reached at [phone], pt was not able to go to work today because she was in so much pain,

## 2017-09-02 NOTE — Telephone Encounter (Signed)
Called pt and offered her the appt and she states she is not coming in, she is going to call the foot and ankle dr,

## 2017-09-02 NOTE — Telephone Encounter (Signed)
Bursitis is an inflammation of a sac of fluid.  Muscle relaxants do not treat this condition.  I recommend OV for evaluation to determine the appropriate diagnosis and treatment.  Offer her the 11 appt (since she isn't at work)

## 2017-09-09 ENCOUNTER — Ambulatory Visit (INDEPENDENT_AMBULATORY_CARE_PROVIDER_SITE_OTHER): Payer: 59

## 2017-09-09 ENCOUNTER — Encounter (INDEPENDENT_AMBULATORY_CARE_PROVIDER_SITE_OTHER): Payer: Self-pay | Admitting: Orthopaedic Surgery

## 2017-09-09 ENCOUNTER — Ambulatory Visit (INDEPENDENT_AMBULATORY_CARE_PROVIDER_SITE_OTHER): Payer: 59 | Admitting: Orthopaedic Surgery

## 2017-09-09 DIAGNOSIS — M25561 Pain in right knee: Secondary | ICD-10-CM

## 2017-09-09 DIAGNOSIS — M722 Plantar fascial fibromatosis: Secondary | ICD-10-CM | POA: Diagnosis not present

## 2017-09-09 MED ORDER — BUPIVACAINE HCL 0.25 % IJ SOLN
2.0000 mL | INTRAMUSCULAR | Status: AC | PRN
  Administered 2017-09-09: 2 mL via INTRA_ARTICULAR

## 2017-09-09 MED ORDER — METHYLPREDNISOLONE ACETATE 40 MG/ML IJ SUSP
40.0000 mg | INTRAMUSCULAR | Status: AC | PRN
  Administered 2017-09-09: 40 mg via INTRA_ARTICULAR

## 2017-09-09 MED ORDER — LIDOCAINE HCL 1 % IJ SOLN
2.0000 mL | INTRAMUSCULAR | Status: AC | PRN
  Administered 2017-09-09: 2 mL

## 2017-09-09 NOTE — Progress Notes (Signed)
Office Visit Note   Patient: Candace Walker           Date of Birth: [birth year]           MRN: [id number] Visit Date: 09/09/2017              Requested by: Rita Ohara, [address], [place] [address] PCP: Rita Ohara, MD   Assessment & Plan: Visit Diagnoses:  1. Right knee pain, unspecified chronicity   2. Plantar fasciitis of left foot     Plan: Impression is right knee chondral malacia patella.  Left heel plantar fasciitis.  In regards to the left knee, we will inject this with cortisone today.  She will follow-up with [place] as needed.  In regards to the left heel, she will continue with her plantar fascia exercises.  I am provided her with a new handout as she did not have a structured program at home.  Follow-up with [place] as needed.  Follow-Up Instructions: Return if symptoms worsen or fail to improve.   Orders:  Orders Placed This Encounter  Procedures  . Large Joint Inj: R knee  . XR KNEE 3 VIEW RIGHT   No orders of the defined types were placed in this encounter.     Procedures: Large Joint Inj: R knee on 09/09/2017 10:43 AM Indications: pain Details: 22 G needle, anterolateral approach Medications: 2 mL lidocaine 1 %; 2 mL bupivacaine 0.25 %; 40 mg methylPREDNISolone acetate 40 MG/ML      Clinical Data: No additional findings.   Subjective: Chief Complaint  Patient presents with  . Right Knee - Pain  . Left Foot - Pain    Pain in left heel    HPI patient is a pleasant 47 year old female who presents to our clinic today with right knee and left heel pain.  In regards to the right knee, this is been ongoing for the past 3 to 4 weeks without any known injury or change in activity.  The pain she has is to the entire left knee but primarily at the anterior aspect with knee extension.  She describes this as a constant ache worse with going from seated to standing positions.  She also notes tightness as well as instability.  No locking or  catching.  She is tried over-the-counter medications without relief of symptoms.  No previous cortisone injection or surgical intervention.  The other issue she brings up today is her left heel.  History of plantar fasciitis for about 9 months now.  She has seen Dr. Paulla Dolly where she has had one cortisone injection.  This made her symptoms worse.  She has used orthotics for the past week and a half and has been doing stretches for the past 9 months.  Review of Systems as detailed in HPI.  All others reviewed and are negative.   Objective: Vital Signs: LMP 11/07/2012   Physical Exam well-developed well-nourished female in no acute distress.  Alert and oriented x3.  Ortho Exam examination of the right knee shows no effusion.  Range of motion 0 to 115 degrees.  Minimal joint line tenderness.  Minimal patellofemoral crepitus.  She is stable valgus varus stress. Calf is soft and nontender. Left heel exam shows marked tenderness over the plantar fascial insertion of the heel.  Limited dorsiflexion.  Flexible pes planus.  She is neurovascularly intact distally.  Specialty Comments:  No specialty comments available.  Imaging: Xr Knee 3 View Right  Result Date: 09/09/2017 Periarticular spurring  consistent with mild osteoarthritis    PMFS History: Patient Active Problem List   Diagnosis Date Noted  . Right knee pain 09/09/2017  . Plantar fasciitis of left foot 09/09/2017  . S/P laparoscopic assisted vaginal hysterectomy (LAVH) 06/12/2013  . Obesity (BMI 30-39.9) 04/25/2013  . Vitamin D deficiency   . Mixed hyperlipidemia   . Essential hypertension, benign   . Asthma with allergic rhinitis   . Allergic rhinitis    Past Medical History:  Diagnosis Date  . Allergic rhinitis, cause unspecified   . Arthritis    bursitis in hips, recently dx with avascular necrosis femor head  . Asthma with allergic rhinitis    rarely uses inhaler unless she gets sick  . Essential hypertension, benign   .  Mixed hyperlipidemia    no meds, diet controlled  . Positive PPD    h/o BCG vaccine, negative chest xray  . SVD (spontaneous vaginal delivery)    x 2  . Unspecified vitamin D deficiency     Family History  Problem Relation Age of Onset  . Diabetes Mother   . Hypertension Mother   . Allergies Mother   . Colon polyps Mother 79  . Heart disease Father 19       MI  . Hyperlipidemia Father   . Hypertension Father   . Cancer Father 53       lung cancer (smoker)  . Lung cancer Father   . Cancer Paternal Grandmother        breast cancer in 89's  . Hypertension Sister     Past Surgical History:  Procedure Laterality Date  . ENDOMETRIAL ABLATION  2007   Dr. Corinna Capra  . LAPAROSCOPIC ASSISTED VAGINAL HYSTERECTOMY N/A 06/12/2013   Procedure: LAPAROSCOPIC ASSISTED VAGINAL HYSTERECTOMY;  Surgeon: Luz Lex, MD;  Location: [facility];  Service: Gynecology;  Laterality: N/A;  . LAPAROSCOPIC BILATERAL SALPINGECTOMY  06/12/2013   Procedure: LAPAROSCOPIC BILATERAL SALPINGECTOMY;  Surgeon: Luz Lex, MD;  Location: [facility];  Service: Gynecology;;  . TUBAL LIGATION    . WISDOM TOOTH EXTRACTION     right bottom lower tooth   Social History   Occupational History  . Occupation: Research scientist (life sciences): [employer]  Tobacco Use  . Smoking status: Never Smoker  . Smokeless tobacco: Never Used  Substance and Sexual Activity  . Alcohol use: Yes    Comment: occasional (once a month)  . Drug use: No  . Sexual activity: Not Currently    Birth control/protection: Surgical    Comment: ablation and tubal ligation and hysterectomy

## 2017-09-20 ENCOUNTER — Other Ambulatory Visit (INDEPENDENT_AMBULATORY_CARE_PROVIDER_SITE_OTHER): Payer: Self-pay | Admitting: Physician Assistant

## 2017-09-20 ENCOUNTER — Telehealth (INDEPENDENT_AMBULATORY_CARE_PROVIDER_SITE_OTHER): Payer: Self-pay | Admitting: Orthopaedic Surgery

## 2017-09-20 MED ORDER — METHOCARBAMOL 500 MG PO TABS: 500.0000 mg | ORAL_TABLET | Freq: Every day | ORAL | 0 refills | Status: DC | PRN

## 2017-09-20 MED FILL — METHOCARBAMOL 500 MG TABS: 500 | 20 days supply | Qty: 20 | Fill #0

## 2017-09-20 NOTE — Telephone Encounter (Signed)
Patient states that she is still feeling the tightness in the back of her leg.  Patient called requesting an RX for a muscle relaxer into [place].  CB#[phone].

## 2017-09-20 NOTE — Telephone Encounter (Signed)
See message.

## 2017-09-20 NOTE — Telephone Encounter (Signed)
Sent in

## 2017-11-05 MED FILL — LISINOPRIL-HCTZ 20-12.5 MG: 20-12.5 | 90 days supply | Qty: 90 | Fill #3

## 2017-11-12 ENCOUNTER — Ambulatory Visit (INDEPENDENT_AMBULATORY_CARE_PROVIDER_SITE_OTHER): Payer: 59 | Admitting: Podiatry

## 2017-11-12 ENCOUNTER — Ambulatory Visit (INDEPENDENT_AMBULATORY_CARE_PROVIDER_SITE_OTHER): Payer: 59

## 2017-11-12 ENCOUNTER — Encounter: Payer: Self-pay | Admitting: Podiatry

## 2017-11-12 DIAGNOSIS — M722 Plantar fascial fibromatosis: Secondary | ICD-10-CM

## 2017-11-12 DIAGNOSIS — M7661 Achilles tendinitis, right leg: Secondary | ICD-10-CM

## 2017-11-12 MED ORDER — DICLOFENAC SODIUM 75 MG PO TBEC: 75.0000 mg | DELAYED_RELEASE_TABLET | Freq: Two times a day (BID) | ORAL | 2 refills | Status: DC

## 2017-11-12 MED ORDER — TRIAMCINOLONE ACETONIDE 10 MG/ML IJ SUSP
10.0000 mg | Freq: Once | INTRAMUSCULAR | Status: AC
  Administered 2017-11-12: 10 mg

## 2017-11-12 MED FILL — DICLOFENAC SODIUM 75 MG TAB: 75 | 25 days supply | Qty: 50 | Fill #0

## 2017-11-12 NOTE — Patient Instructions (Signed)

## 2017-11-15 NOTE — Progress Notes (Signed)
Subjective:   Patient ID: Candace Walker, female   DOB: 48 y.o.   MRN: [id number]   HPI Patient has redeveloped discomfort plantar aspect heel region bilateral and states is been sore for and states that her arch has dropped more than it had previously and its tender with palpation   ROS      Objective:  Physical Exam  Neurovascular status intact with inflammation of the plantar fascial bilateral with fluid buildup noted and moderate depression of the arch     Assessment:  Acute plantar fasciitis bilateral     Plan:  H&P conditions reviewed and I focused on the plantar fashion today injecting the insertion into the calcaneus 3 mg Kenalog 5 mg Xylocaine applied fascial brace instructed on physical therapy anti-inflammatories and reappoint for [place] to recheck again in the next several weeks.  Gave instructions on shoe gear modifications  X-rays taken bilateral indicated that there is spur formation bilateral moderate depression of the arch with no indications of other pathology

## 2017-11-25 ENCOUNTER — Telehealth: Payer: Self-pay

## 2017-11-25 DIAGNOSIS — Z5181 Encounter for therapeutic drug level monitoring: Secondary | ICD-10-CM

## 2017-11-25 DIAGNOSIS — E782 Mixed hyperlipidemia: Secondary | ICD-10-CM

## 2017-11-25 DIAGNOSIS — Z0000 Encounter for general adult medical examination without abnormal findings: Secondary | ICD-10-CM

## 2017-11-25 DIAGNOSIS — I10 Essential (primary) hypertension: Secondary | ICD-10-CM

## 2017-11-25 NOTE — Telephone Encounter (Signed)
Patient wants to know if she can come in to have her labs drawn prior to her physical 01/05/18. If so please place orders for labs

## 2017-11-26 NOTE — Addendum Note (Signed)
Addended by: Rita Ohara on: 11/26/2017 04:49 PM   Modules accepted: Orders

## 2017-11-26 NOTE — Telephone Encounter (Signed)
Orders entered, okay to schedule lab visit

## 2017-11-29 ENCOUNTER — Ambulatory Visit: Payer: 59 | Admitting: Family Medicine

## 2017-11-29 NOTE — Telephone Encounter (Signed)
Lab appointment scheduled 

## 2017-12-03 ENCOUNTER — Ambulatory Visit: Payer: 59 | Admitting: Podiatry

## 2017-12-21 DIAGNOSIS — H524 Presbyopia: Secondary | ICD-10-CM | POA: Diagnosis not present

## 2017-12-21 DIAGNOSIS — H5203 Hypermetropia, bilateral: Secondary | ICD-10-CM | POA: Diagnosis not present

## 2017-12-21 DIAGNOSIS — H52221 Regular astigmatism, right eye: Secondary | ICD-10-CM | POA: Diagnosis not present

## 2018-01-03 ENCOUNTER — Other Ambulatory Visit (INDEPENDENT_AMBULATORY_CARE_PROVIDER_SITE_OTHER): Payer: 59

## 2018-01-03 DIAGNOSIS — E782 Mixed hyperlipidemia: Secondary | ICD-10-CM | POA: Diagnosis not present

## 2018-01-03 DIAGNOSIS — Z0000 Encounter for general adult medical examination without abnormal findings: Secondary | ICD-10-CM

## 2018-01-03 DIAGNOSIS — Z5181 Encounter for therapeutic drug level monitoring: Secondary | ICD-10-CM

## 2018-01-03 DIAGNOSIS — I10 Essential (primary) hypertension: Secondary | ICD-10-CM

## 2018-01-04 LAB — CBC WITH DIFFERENTIAL/PLATELET
BASOS: 0 %
Basophils Absolute: 0 10*3/uL (ref 0.0–0.2)
EOS (ABSOLUTE): 0 10*3/uL (ref 0.0–0.4)
Eos: 1 %
Hematocrit: 38.2 % (ref 34.0–46.6)
Hemoglobin: 12.4 g/dL (ref 11.1–15.9)
IMMATURE GRANS (ABS): 0 10*3/uL (ref 0.0–0.1)
Immature Granulocytes: 0 %
LYMPHS: 41 %
Lymphocytes Absolute: 1.3 10*3/uL (ref 0.7–3.1)
MCH: 28.8 pg (ref 26.6–33.0)
MCHC: 32.5 g/dL (ref 31.5–35.7)
MCV: 89 fL (ref 79–97)
Monocytes Absolute: 0.3 10*3/uL (ref 0.1–0.9)
Monocytes: 11 %
NEUTROS ABS: 1.5 10*3/uL (ref 1.4–7.0)
Neutrophils: 47 %
Platelets: 146 10*3/uL — ABNORMAL LOW (ref 150–450)
RBC: 4.3 x10E6/uL (ref 3.77–5.28)
RDW: 13.1 % (ref 12.3–15.4)
WBC: 3.2 10*3/uL — ABNORMAL LOW (ref 3.4–10.8)

## 2018-01-04 LAB — COMPREHENSIVE METABOLIC PANEL
ALK PHOS: 61 IU/L (ref 39–117)
ALT: 22 IU/L (ref 0–32)
AST: 26 IU/L (ref 0–40)
Albumin/Globulin Ratio: 1.6 (ref 1.2–2.2)
Albumin: 4.2 g/dL (ref 3.5–5.5)
BILIRUBIN TOTAL: 0.4 mg/dL (ref 0.0–1.2)
BUN/Creatinine Ratio: 17 (ref 9–23)
BUN: 18 mg/dL (ref 6–24)
CALCIUM: 9.7 mg/dL (ref 8.7–10.2)
CHLORIDE: 100 mmol/L (ref 96–106)
CO2: 24 mmol/L (ref 20–29)
Creatinine, Ser: 1.08 mg/dL — ABNORMAL HIGH (ref 0.57–1.00)
GFR calc Af Amer: 70 mL/min/{1.73_m2} (ref >59)
GFR calc non Af Amer: 61 mL/min/{1.73_m2} (ref >59)
Globulin, Total: 2.7 g/dL (ref 1.5–4.5)
Glucose: 93 mg/dL (ref 65–99)
POTASSIUM: 4.1 mmol/L (ref 3.5–5.2)
Sodium: 137 mmol/L (ref 134–144)
Total Protein: 6.9 g/dL (ref 6.0–8.5)

## 2018-01-04 LAB — LIPID PANEL
CHOLESTEROL TOTAL: 229 mg/dL — AB (ref 100–199)
Chol/HDL Ratio: 4.8 ratio — ABNORMAL HIGH (ref 0.0–4.4)
HDL: 48 mg/dL (ref >39)
LDL Calculated: 157 mg/dL — ABNORMAL HIGH (ref 0–99)
TRIGLYCERIDES: 120 mg/dL (ref 0–149)
VLDL Cholesterol Cal: 24 mg/dL (ref 5–40)

## 2018-01-04 LAB — TSH: TSH: 0.814 u[IU]/mL (ref 0.450–4.500)

## 2018-01-04 NOTE — Progress Notes (Signed)
Chief Complaint  Patient presents with  . Annual Exam    nonfasting annual exam, no pap. UA showed trace blood. Just had eye exam this month with Dr. Peter Garter. Would like to know if you would test her for the CA-125 today-she feels like her family is full of cancer and she does still have her ovaries.   . Murmur    sometimes she feels like her heart stops and then it also feels like it races at times. Just wants to check to be on safe side.     Candace Walker is a 47 y.o. female who presents for a complete physical.  She has the following concerns:  She is requesting EKG to be done today. When she was at work about a month ago, she had a fast heart rate, then she felt a pause, she coughed, then it started again.  A nurse listened, reports heart was racing, and she heard a pause. She recalls this happening once earlier last year, lasted a few hours.  Happened again last month, just those 2 episodes.  She has h/o heart murmur since childhood. She recalls having a heart monitor in the past, while being seen at [facility].  Paper chart reviewed--only records from [facility] was 1 note from Dr. Christella Scheuermann in 12/2009, and she reports it would have been earlier.  She recalls wearing monitor overnight, and that results were normal. Caffeine--1 cup of coffee every morning.   She saw Dr. Paulla Dolly and had bilateral plantar fascia injections about 6 weeks ago. She saw orthopedist (Dr. Erlinda Hong)  in July for right knee pain, had cortisone injection. X-rays showed OA, spurs. She took NSAIDs for both her knees and her heels.  Not currently taking them. Symptoms resolved. Currently denies heel or knee pain. She has ongoing/chronic hip pain.  She can't take/tolerate ibuprofen.  She reports that muscle relaxers have helped (has rx from Dr. Erlinda Hong). She mostly learns to live with it, and does stretches.  Hypertension follow-up: Blood pressures elsewhere are fine when checkedat work.108-125/70's.  Denies dizziness, headaches, chest pain,  muscle cramps or edema. Denies side effects of medications. Rare palpitations/tachycardia as mentioned above.  Allergies and asthma: Doing well on her current regimen of Singulair, Allegra, Flonaseand nasal saline spray. Not needing albuterol recently, not since she was sick in March. After exposure to perfume or flowers, she needs to take a 30 minute break, albuterol prn (and avoids going back into that particular room for the rest of the shift). This happens less frequently since there are fewer flowers allowed in patient rooms. Tries to avoid by switching with somebody when she sees a lot of flowers in the patient's room, before any problems.   She was seen 3/27 and 3/28 in our office for acute flare of allergies/asthma. No other illness requiring visits here.  H/o vitamin D deficiency: She is compliant with daily supplement. Last check was 11/2013, level was 37 on the same vitamin.  Hyperlipidemia: This has been diet controlled, she has never taken cholesterol medication. Lipid panel was much higher last year--at that time she had been stressed (since learning of her father's cancer), eating differently.  Eating more carbs-- breads, muffins, cake.  She had been eating 2 boiled eggs daily for breakfast for several months (along with yogurt and a banana).  14 eggs/week prior to last year's labs No red meats or cheese, ice cream.  No creamy dressings, sauces, soups, etc. Only change was increase in carbs, and egg intake. Lipids  had gone up:  Lipids 12/2016: Cholesterol <200 mg/dL 265High    HDL >50 mg/dL 52   Triglycerides <150 mg/dL 164High    LDL Cholesterol (Calc) mg/dL (calc) 182High    We had her try and follow low cholesterol diet and return for 3 month recheck.  Lipid were just as high/higher on repeat testing, and she was asked to return for OV to discuss her cholesterol results, but she never schedule appointment.  Lipids 03/29/17 Cholesterol, Total 100 - 199 mg/dL 271High      Triglycerides 0 - 149 mg/dL 171High     HDL >39 mg/dL 49    VLDL Cholesterol Cal 5 - 40 mg/dL 34    LDL Calculated 0 - 99 mg/dL 188High     Chol/HDL Ratio 0.0 - 4.4 ratio 5.5High     She had labs done prior to today's visit, see below. Current diet includes: Egg whites only, no red meat. Not much cheese. Eats a lot of chicken, oatmeal, salad, uses vinaigrette dressing (sundried tomato). She would like to try Red Yeast Rice to see if that helps, has spoken to others about this.   Immunization History  Administered Date(s) Administered  . Influenza Split 10/15/2010, 11/19/2011, 10/31/2013  . Influenza Whole 11/12/2012  . Influenza-Unspecified 11/05/2014, 11/17/2015, 11/09/2016  . Pneumococcal Polysaccharide-23 04/24/2013  . Tdap 04/24/2013   Yearly flu shots at work [employer]) Last Pap smear: 02/2015(per patient she went there last year, seesDr. Corinna Capra, GYN); no records received.  She plans to go in April Last mammogram:at Dr. Gregor Hams office, reports she had one last year (no records). Last colonoscopy: never; she was referred in the past due to FHx of polyps, but she canceled her appointment. It was too expensive. Plan changed this year, never scheduled. "I will call for appointment next year" Last DEXA: never Dentist: twice yearly Ophtho: Yearly Exercise: Goes to the gym 3-4 days/week--weights, 35-50 minutes of cardio. Walks on the job.  Past Medical History:  Diagnosis Date  . Allergic rhinitis, cause unspecified   . Arthritis    bursitis in hips, recently dx with avascular necrosis femor head  . Asthma with allergic rhinitis    rarely uses inhaler unless she gets sick  . Essential hypertension, benign   . Mixed hyperlipidemia    no meds, diet controlled  . Positive PPD    h/o BCG vaccine, negative chest xray  . SVD (spontaneous vaginal delivery)    x 2  . Unspecified vitamin D deficiency     Past Surgical History:  Procedure Laterality Date  . ENDOMETRIAL  ABLATION  2007   Dr. Corinna Capra  . LAPAROSCOPIC ASSISTED VAGINAL HYSTERECTOMY N/A 06/12/2013   Procedure: LAPAROSCOPIC ASSISTED VAGINAL HYSTERECTOMY;  Surgeon: Luz Lex, MD;  Location: [facility];  Service: Gynecology;  Laterality: N/A;  . LAPAROSCOPIC BILATERAL SALPINGECTOMY  06/12/2013   Procedure: LAPAROSCOPIC BILATERAL SALPINGECTOMY;  Surgeon: Luz Lex, MD;  Location: [facility];  Service: Gynecology;;  . TUBAL LIGATION    . WISDOM TOOTH EXTRACTION     right bottom lower tooth    Social History   Socioeconomic History  . Marital status: Divorced    Spouse name: Not on file  . Number of children: 2  . Years of education: Not on file  . Highest education level: Not on file  Occupational History  . Occupation: Research scientist (life sciences): [employer]  . Financial resource strain: Not on file  . Food insecurity:  Worry: Not on file    Inability: Not on file  . Transportation needs:    Medical: Not on file    Non-medical: Not on file  Tobacco Use  . Smoking status: Never Smoker  . Smokeless tobacco: Never Used  Substance and Sexual Activity  . Alcohol use: Yes    Comment: occasional (once a month)  . Drug use: No  . Sexual activity: Not Currently    Birth control/protection: Surgical    Comment: ablation and tubal ligation and hysterectomy  Lifestyle  . Physical activity:    Days per week: Not on file    Minutes per session: Not on file  . Stress: Not on file  Relationships  . Social connections:    Talks on phone: Not on file    Gets together: Not on file    Attends religious service: Not on file    Active member of club or organization: Not on file    Attends meetings of clubs or organizations: Not on file    Relationship status: Not on file  . Intimate partner violence:    Fear of current or ex partner: Not on file    Emotionally abused: Not on file    Physically abused: Not on file    Forced sexual activity: Not on file  Other Topics Concern  . Not on  file  Social History Narrative   Lives with her son; no pets. Divorced from her husband.  Daughter is in the army, living in [place].   Parents are staying with her since 12/2016 (from  [place]. [place]), father is on treatment for lung cancer (started 12/2016).    Family History  Problem Relation Age of Onset  . Diabetes Mother   . Hypertension Mother   . Allergies Mother   . Colon polyps Mother 50  . Heart disease Father 78       MI  . Hyperlipidemia Father   . Hypertension Father   . Cancer Father 8       lung cancer (smoker)  . Lung cancer Father   . Cancer Paternal Grandmother        breast cancer in 6's  . Hypertension Sister     Outpatient Encounter Medications as of 01/05/2018  Medication Sig  . cholecalciferol (VITAMIN D) 1000 UNITS tablet Take 1,000 Units by mouth daily. Reported on 07/22/2015  . fexofenadine (ALLEGRA) 180 MG tablet Take 180 mg by mouth at bedtime. Reported on 07/22/2015  . fish oil-omega-3 fatty acids 1000 MG capsule Take 3 g by mouth daily. Reported on 07/22/2015  . lisinopril-hydrochlorothiazide (PRINZIDE,ZESTORETIC) 20-12.5 MG tablet Take 1 tablet by mouth daily.  . montelukast (SINGULAIR) 10 MG tablet TAKE 1 TABLET (10 MG TOTAL) BY MOUTH AT BEDTIME.  . [DISCONTINUED] lisinopril-hydrochlorothiazide (PRINZIDE,ZESTORETIC) 20-12.5 MG tablet Take 1 tablet daily by mouth.  . [DISCONTINUED] montelukast (SINGULAIR) 10 MG tablet TAKE 1 TABLET (10 MG TOTAL) BY MOUTH AT BEDTIME.  Marland Kitchen acetaminophen (TYLENOL) 500 MG tablet Take 1,000 mg by mouth every 6 (six) hours as needed.  . methocarbamol (ROBAXIN) 500 MG tablet Take 1 tablet (500 mg total) by mouth daily as needed for muscle spasms. (Patient not taking: Reported on 01/05/2018)  . VENTOLIN HFA 108 (90 Base) MCG/ACT inhaler INHALE 2 PUFFS INTO THE LUNGS EVERY 6 HOURS AS NEEDED FOR WHEEZING (Patient not taking: Reported on 01/05/2018)  . [DISCONTINUED] azithromycin (ZITHROMAX Z-PAK) 250 MG tablet As directed  .  [DISCONTINUED] diclofenac (VOLTAREN) 75 MG EC tablet Take 1  tablet (75 mg total) by mouth 2 (two) times daily.  . [DISCONTINUED] Diphenhydramine-Phenylephrine (SUDAFED PE DAY & NIGHT PO) Take 2 capsules by mouth every 6 (six) hours as needed.  . [DISCONTINUED] ibuprofen (ADVIL,MOTRIN) 800 MG tablet Take 1 tablet (800 mg total) by mouth every 8 (eight) hours as needed. (Patient not taking: Reported on 01/05/2018)  . [DISCONTINUED] meloxicam (MOBIC) 15 MG tablet Take 1 tablet by mouth with food once daily.  Take for up to 2 weeks at a time as needed for bursitis/pain (Patient not taking: Reported on 01/05/2018)  . [DISCONTINUED] predniSONE (DELTASONE) 20 MG tablet Take 1 tablet (20 mg total) by mouth 2 (two) times daily with a meal.   Facility-Administered Encounter Medications as of 01/05/2018  Medication  . albuterol (PROVENTIL) (2.5 MG/3ML) 0.083% nebulizer solution 2.5 mg    Allergies  Allergen Reactions  . Nexium [Esomeprazole Magnesium] Itching  . Penicillins Hives  . Percocet [Oxycodone-Acetaminophen] Itching    ROS: The patient denies anorexia, fever, headaches, vision changes, decreased hearing, ear pain, sore throat, breast concerns, chest pain, dizziness, syncope, dyspnea on exertion, cough, swelling, nausea, vomiting, diarrhea, constipation, abdominal pain, melena, hematochezia, hematuria, incontinence, dysuria, vaginal bleeding, discharge, odor or itch, genital lesions, numbness, tingling, weakness, tremor, suspicious skin lesions, depression, anxiety, abnormal bleeding or enlarged lymph nodes. Bilateral PF--resolved after cortisone shots Right knee pain--resolved after cortisone shot Hip pain off/on-- +bilateral hips. Heat and massage helps, as does the muscle relaxants (?prev dx'd as bursitis). Allergies are controlled; breathing is good.    PHYSICAL EXAM:  BP 120/80   Pulse 80   Ht 5\' 8"  (1.727 m)   Wt 233 lb 6.4 oz (105.9 kg)   LMP 11/07/2012   BMI 35.49 kg/m   Wt  Readings from Last 3 Encounters:  01/05/18 233 lb 6.4 oz (105.9 kg)  05/13/17 238 lb (108 kg)  05/12/17 241 lb 9.6 oz (109.6 kg)     General Appearance:   Alert, cooperative, no distress, appears stated age. She declined changing into gown. She is in good spirits.  Head:   Normocephalic, without obvious abnormality, atraumatic  Eyes:   PERRL, conjunctiva/corneas clear, EOM's intact, fundi benign. Pterygium medially on the right  Ears:   Normal TM's and external ear canals  Nose:  Nares normal, mucosa is mild-mod edematous,no drainage or sinus tenderness  Throat:  Lips, mucosa, and tongue normal; teeth and gums normal  Neck:  Supple, no lymphadenopathy; thyroid: no enlargement/tenderness/nodules; no carotid bruit or JVD  Back:  Spine nontender, no curvature, ROM normal, no CVAtenderness  Lungs:   Clear to auscultation bilaterally without wheezes, rales orronchi; respirations unlabored  Chest Wall:   No tenderness or deformity  Heart:   Regular rate and rhythm, S1 and S2 normal, no murmur, rub or gallop, no ectopy  Breast Exam:   Deferred to GYN  Abdomen:   Soft, non-tender, nondistended, normoactive bowel sounds, no masses, no hepatosplenomegaly  Genitalia:   Deferred to GYN     Extremities:  No clubbing, cyanosis or edema. Tender at bilateral trochanteric bursa, R>L. Also had some tenderness in buttocks, at pyriformis and deep gluteal muscles, with discomfort on stretching, tighter on the right than left.  Pulses:  2+ and symmetric all extremities  Skin:  Skin color, texture, turgor normal, no rashes or lesions  Lymph nodes:  Cervical, supraclavicular, and axillary nodes normal  Neurologic:  CNII-XII intact, normal strength, sensation and gait; reflexes 2+ and symmetric throughout   Psych: Normal mood, affect, hygiene  and grooming  Lab Results  Component Value Date   CHOL 229 (H) 01/03/2018    HDL 48 01/03/2018   LDLCALC 157 (H) 01/03/2018   TRIG 120 01/03/2018   CHOLHDL 4.8 (H) 01/03/2018     Chemistry      Component Value Date/Time   NA 137 01/03/2018 0832   K 4.1 01/03/2018 0832   CL 100 01/03/2018 0832   CO2 24 01/03/2018 0832   BUN 18 01/03/2018 0832   CREATININE 1.08 (H) 01/03/2018 0832   CREATININE 1.00 12/30/2016 1046      Component Value Date/Time   CALCIUM 9.7 01/03/2018 0832   ALKPHOS 61 01/03/2018 0832   AST 26 01/03/2018 0832   ALT 22 01/03/2018 0832   BILITOT 0.4 01/03/2018 0832     Fasting glucose 93  Lab Results  Component Value Date   WBC 3.2 (L) 01/03/2018   HGB 12.4 01/03/2018   HCT 38.2 01/03/2018   MCV 89 01/03/2018   PLT 146 (L) 01/03/2018   Lab Results  Component Value Date   TSH 0.814 01/03/2018   Spirometry--normal EKG:  NSR, incomplete RBBB.  Compared to 05/2013 EKG--slight differences noted in III and V1, some earlier transition.  No acute changes.   ASSESSMENT/PLAN:  Annual physical exam - Plan: POCT Urinalysis DIP (Proadvantage Device)  Mixed hyperlipidemia - improved, still above goal. Reviewed diet, which is good. Plans to start red yeast rice. Recheck lipid/LFTs in 20mos. likely has genetic component - Plan: Lipid panel, Hepatic function panel  Essential hypertension, benign - well controlled - Plan: lisinopril-hydrochlorothiazide (PRINZIDE,ZESTORETIC) 20-12.5 MG tablet, EKG 12-Lead  Asthma with allergic rhinitis without complication, unspecified asthma severity - controlled on current regimen; normal spirometry today - Plan: montelukast (SINGULAIR) 10 MG tablet, Spirometry with Graph  Family history of colonic polyps - encouraged calling to schedule colonoscopy, reports she will (next year)  Palpitations - 2 episodes with rapid heart rate, and pauses noted. Reassured normal exam, no EKG findings, normal labs. Refer back to cards if recurs  Bilateral hip pain - some component of bursitis, but also tight pyriformis and  gluteal muscles. Stretches shown    Needs to increase cardio. Weight loss encouraged. Lipids improved, still above goal; diet is good, discussed hereditary component. ASCVD calculator: 3.1% 10 year risk 39% lifetime risk  Discussed monthly self breast exams and yearly mammograms; at least 30 minutes of aerobic activity at least 5 days/week, weight-bearing exercise at least 2x/week; proper sunscreen use reviewed; healthy diet, including goals of calcium and vitamin D intake and alcohol recommendations (less than or equal to 1 drink/day) reviewed; regular seatbelt use; changing batteries in smoke detectors. Immunization recommendations discussed--UTD. Continue yearly flu shots. Colonoscopy recommendations reviewed--she was referred for colonoscopy in the past due to her mother having many colon polyps. Needs to call and schedule  Need mammo and pap smears/notes from GYN, Dr. Corinna Capra (never got any reports last year) She plans to go back to GYN in the Spring.  Discussed Ca 125, not recommended for screening and why.  Discussed that she should have yearly pelvic exams (and can further discuss with Dr. Corinna Capra, if desired, the CA 125).   Willing to try [facility] Schedule 3 month fasting labs Lipids, liver  CPE 1 year, f/u sooner prn

## 2018-01-05 ENCOUNTER — Encounter: Payer: Self-pay | Admitting: Family Medicine

## 2018-01-05 ENCOUNTER — Ambulatory Visit (INDEPENDENT_AMBULATORY_CARE_PROVIDER_SITE_OTHER): Payer: 59 | Admitting: Family Medicine

## 2018-01-05 VITALS — BP 120/80 | HR 80 | Ht 68.0 in | Wt 233.4 lb

## 2018-01-05 DIAGNOSIS — J45909 Unspecified asthma, uncomplicated: Secondary | ICD-10-CM | POA: Diagnosis not present

## 2018-01-05 DIAGNOSIS — Z8371 Family history of colonic polyps: Secondary | ICD-10-CM

## 2018-01-05 DIAGNOSIS — I10 Essential (primary) hypertension: Secondary | ICD-10-CM | POA: Diagnosis not present

## 2018-01-05 DIAGNOSIS — M25552 Pain in left hip: Secondary | ICD-10-CM

## 2018-01-05 DIAGNOSIS — Z0000 Encounter for general adult medical examination without abnormal findings: Secondary | ICD-10-CM | POA: Diagnosis not present

## 2018-01-05 DIAGNOSIS — M25551 Pain in right hip: Secondary | ICD-10-CM | POA: Diagnosis not present

## 2018-01-05 DIAGNOSIS — R002 Palpitations: Secondary | ICD-10-CM | POA: Diagnosis not present

## 2018-01-05 DIAGNOSIS — Z83719 Family history of colon polyps, unspecified: Secondary | ICD-10-CM

## 2018-01-05 DIAGNOSIS — E782 Mixed hyperlipidemia: Secondary | ICD-10-CM | POA: Diagnosis not present

## 2018-01-05 LAB — POCT URINALYSIS DIP (PROADVANTAGE DEVICE)
BILIRUBIN UA: NEGATIVE
BILIRUBIN UA: NEGATIVE mg/dL
Glucose, UA: NEGATIVE mg/dL
Leukocytes, UA: NEGATIVE
Nitrite, UA: NEGATIVE
PH UA: 6.5 (ref 5.0–8.0)
Protein Ur, POC: NEGATIVE mg/dL
SPECIFIC GRAVITY, URINE: 1.01
Urobilinogen, Ur: NEGATIVE

## 2018-01-05 MED ORDER — LISINOPRIL-HYDROCHLOROTHIAZIDE 20-12.5 MG PO TABS: 1.0000 | ORAL_TABLET | Freq: Every day | ORAL | 3 refills | Status: DC

## 2018-01-05 MED ORDER — MONTELUKAST SODIUM 10 MG PO TABS: ORAL_TABLET | ORAL | 3 refills | Status: DC

## 2018-01-05 MED FILL — MONTELUKAST SOD 10 MG TAB: 10 | 90 days supply | Qty: 90 | Fill #0

## 2018-01-05 NOTE — Patient Instructions (Addendum)
  HEALTH MAINTENANCE RECOMMENDATIONS:  It is recommended that you get at least 30 minutes of aerobic exercise at least 5 days/week (for weight loss, you may need as much as 60-90 minutes). This can be any activity that gets your heart rate up. This can be divided in 10-15 minute intervals if needed, but try and build up your endurance at least once a week.  Weight bearing exercise is also recommended twice weekly.  Eat a healthy diet with lots of vegetables, fruits and fiber.  "Colorful" foods have a lot of vitamins (ie green vegetables, tomatoes, red peppers, etc).  Limit sweet tea, regular sodas and alcoholic beverages, all of which has a lot of calories and sugar.  Up to 1 alcoholic drink daily may be beneficial for women (unless trying to lose weight, watch sugars).  Drink a lot of water.  Calcium recommendations are 1200-1500 mg daily (1500 mg for postmenopausal women or women without ovaries), and vitamin D 1000 IU daily.  This should be obtained from diet and/or supplements (vitamins), and calcium should not be taken all at once, but in divided doses.  Monthly self breast exams and yearly mammograms for women over the age of 68 is recommended.  Sunscreen of at least SPF 30 should be used on all sun-exposed parts of the skin when outside between the hours of 10 am and 4 pm (not just when at beach or pool, but even with exercise, golf, tennis, and yard work!)  Use a sunscreen that says "broad spectrum" so it covers both UVA and UVB rays, and make sure to reapply every 1-2 hours.  Remember to change the batteries in your smoke detectors when changing your clock times in the spring and fall.  Use your seat belt every time you are in a car, and please drive safely and not be distracted with cell phones and texting while driving.  We spoke of your cholesterol in detail today. Start the red yeast rice (take as directed on the bottle), and return in 3 months for fasting labs to see how well it is  working.  If you have recurrent problems with fast heart rate or palpitations, please try and get in with [place] as soon as you can (to see if we can catch any abnormal rhythm), vs urgent care if after hours. We may need to send you back to the cardiologist for further evaluation if this becomes more frequent or severe.

## 2018-01-18 ENCOUNTER — Telehealth: Payer: Self-pay | Admitting: Family Medicine

## 2018-01-18 NOTE — Telephone Encounter (Signed)
Received requested records from [facility]. Sending back for review.  °

## 2018-01-24 ENCOUNTER — Encounter: Payer: Self-pay | Admitting: *Deleted

## 2018-02-02 MED FILL — LISINOPRIL-HCTZ 20-12.5 MG: 20-12.5 | 90 days supply | Qty: 90 | Fill #0

## 2018-03-10 ENCOUNTER — Telehealth: Payer: Self-pay | Admitting: *Deleted

## 2018-03-10 MED ORDER — VALACYCLOVIR HCL 1 G PO TABS: ORAL_TABLET | ORAL | 0 refills | Status: DC

## 2018-03-10 MED FILL — valACYclovir HCL 1 GM TABS: 1 | 5 days supply | Qty: 20 | Fill #0

## 2018-03-10 NOTE — Telephone Encounter (Signed)
Patient having itching sensation and feels like cold sores are about to come out on her bottom lip. Asking if you would call in a refill on her valtrex 1000mg -states that Audelia Acton called in for her a long time ago and her bottle has expired. I looked in chart and he called in 07/2012.

## 2018-03-10 NOTE — Telephone Encounter (Signed)
Patient advised.

## 2018-03-10 NOTE — Telephone Encounter (Signed)
Advise pt--sent in #20 (which is enough for 5 courses, instructions are on bottle).

## 2018-03-16 ENCOUNTER — Ambulatory Visit: Payer: 59

## 2018-03-16 ENCOUNTER — Ambulatory Visit (INDEPENDENT_AMBULATORY_CARE_PROVIDER_SITE_OTHER): Payer: 59 | Admitting: Podiatry

## 2018-03-16 ENCOUNTER — Encounter: Payer: Self-pay | Admitting: Podiatry

## 2018-03-16 ENCOUNTER — Ambulatory Visit (INDEPENDENT_AMBULATORY_CARE_PROVIDER_SITE_OTHER): Payer: 59

## 2018-03-16 ENCOUNTER — Telehealth: Payer: Self-pay | Admitting: *Deleted

## 2018-03-16 DIAGNOSIS — M79672 Pain in left foot: Principal | ICD-10-CM

## 2018-03-16 DIAGNOSIS — M79671 Pain in right foot: Secondary | ICD-10-CM

## 2018-03-16 DIAGNOSIS — M722 Plantar fascial fibromatosis: Secondary | ICD-10-CM

## 2018-03-16 DIAGNOSIS — M779 Enthesopathy, unspecified: Secondary | ICD-10-CM | POA: Diagnosis not present

## 2018-03-16 DIAGNOSIS — M7661 Achilles tendinitis, right leg: Secondary | ICD-10-CM | POA: Diagnosis not present

## 2018-03-16 MED ORDER — METHYLPREDNISOLONE 4 MG PO TBPK: ORAL_TABLET | ORAL | 0 refills | Status: DC

## 2018-03-16 MED ORDER — IBUPROFEN 200 MG PO TABS: 800.0000 mg | ORAL_TABLET | Freq: Three times a day (TID) | ORAL | Status: AC

## 2018-03-16 MED ORDER — IBUPROFEN 800 MG PO TABS: 800.0000 mg | ORAL_TABLET | Freq: Three times a day (TID) | ORAL | 0 refills | Status: DC | PRN

## 2018-03-16 MED FILL — IBUPROFEN 800 MG TAB: 800 | 10 days supply | Qty: 30 | Fill #0

## 2018-03-16 MED FILL — METHYLPREDNISOLONE 4 MG TAB: 4 | 6 days supply | Qty: 21 | Fill #0

## 2018-03-16 NOTE — Progress Notes (Signed)
Subjective:   Patient ID: Candace Walker, female   DOB: 48 y.o.   MRN: [id number]   HPI Patient states she did well for 4 months and has had a reoccurrence of pain in her left plantar heel and posterior Achilles right.  States that she is taking care of her dad now and is concerned about finances but wants to know is or anything else we can do   ROS      Objective:  Physical Exam  Neurovascular status intact with patient's posterior heel being inflamed or open plantar heel were inflamed with discomfort upon palpation     Assessment:  Plantar fasciitis Achilles tendinitis that was doing very well for 4 months and is occurred again     Plan:  I discussed long-term orthotics which I think will be best for her but she has to hold off now and night splint which she has to hold off on.  I placed her on Medrol Dosepak to be followed by oral anti-inflammatories Motrin and we will try this and if not improved we will have to consider cortisone injection.  Reappoint to recheck  X-rays indicate spur formation but no indications of other stress fracture issue or other acute pathology

## 2018-03-16 NOTE — Patient Instructions (Signed)

## 2018-03-16 NOTE — Telephone Encounter (Signed)
I left a voicemail to let patient know that Dr. Paulla Dolly did send the following medications to her [facility]:  1.  Medrol Dosepak 2.  Ibuprofen 800 mg  Patient had stopped back in after her appointment this afternoon requesting to see me, Rolly Pancake, CMA, to inquire about the prescriptions Dr. Paulla Dolly had sent. Patient had left before I could confirm with her what was sent to her.  Patient called me back at 2:45 pm and I was able to verbally confirm to her that her medication was sent over to her pharmacy. Patient stated she understood.

## 2018-04-01 DIAGNOSIS — Z1231 Encounter for screening mammogram for malignant neoplasm of breast: Secondary | ICD-10-CM | POA: Diagnosis not present

## 2018-04-01 DIAGNOSIS — Z6835 Body mass index (BMI) 35.0-35.9, adult: Secondary | ICD-10-CM | POA: Diagnosis not present

## 2018-04-01 DIAGNOSIS — Z01419 Encounter for gynecological examination (general) (routine) without abnormal findings: Secondary | ICD-10-CM | POA: Diagnosis not present

## 2018-04-05 LAB — HM MAMMOGRAPHY

## 2018-04-05 LAB — HM PAP SMEAR: HM Pap smear: NEGATIVE

## 2018-04-11 MED FILL — DICLOFENAC SODIUM 75 MG TAB: 75 | 25 days supply | Qty: 50 | Fill #1

## 2018-04-13 ENCOUNTER — Ambulatory Visit (INDEPENDENT_AMBULATORY_CARE_PROVIDER_SITE_OTHER): Payer: 59 | Admitting: Orthopaedic Surgery

## 2018-04-26 ENCOUNTER — Telehealth (INDEPENDENT_AMBULATORY_CARE_PROVIDER_SITE_OTHER): Payer: Self-pay | Admitting: Orthopaedic Surgery

## 2018-04-26 ENCOUNTER — Encounter (INDEPENDENT_AMBULATORY_CARE_PROVIDER_SITE_OTHER): Payer: Self-pay | Admitting: Orthopaedic Surgery

## 2018-04-26 ENCOUNTER — Ambulatory Visit (INDEPENDENT_AMBULATORY_CARE_PROVIDER_SITE_OTHER): Payer: 59 | Admitting: Orthopaedic Surgery

## 2018-04-26 DIAGNOSIS — M7671 Peroneal tendinitis, right leg: Secondary | ICD-10-CM | POA: Diagnosis not present

## 2018-04-26 MED ORDER — MELOXICAM 7.5 MG PO TABS: 7.5000 mg | ORAL_TABLET | Freq: Two times a day (BID) | ORAL | 2 refills | Status: DC | PRN

## 2018-04-26 MED FILL — MELOXICAM 7.5 MG TABLET: 7.5 | 15 days supply | Qty: 30 | Fill #0

## 2018-04-26 NOTE — Telephone Encounter (Signed)
Patient will come in today after 3:30 or tomorrow AM.

## 2018-04-26 NOTE — Progress Notes (Signed)
Office Visit Note   Patient: Candace Walker           Date of Birth: 1970/05/31           MRN: [id number] Visit Date: 04/26/2018              Requested by: Rita Ohara, [address] [place], [place] [id number] PCP: Rita Ohara, MD   Assessment & Plan: Visit Diagnoses:  1. Peroneal tendinitis, right leg     Plan: Impression is right ankle peroneal tendinitis.  We discussed cam boot versus ASO brace and physical therapy as well as meloxicam.  She would like to try the ASO brace and meloxicam for now.  If not any better we can consider a cortisone injection under ultrasound.  Questions encouraged and answered.  Follow-up as needed.  Follow-Up Instructions: Return if symptoms worsen or fail to improve.   Orders:  No orders of the defined types were placed in this encounter.  Meds ordered this encounter  Medications  . meloxicam (MOBIC) 7.5 MG tablet    Sig: Take 1 tablet (7.5 mg total) by mouth 2 (two) times daily as needed for pain.    Dispense:  30 tablet    Refill:  2      Procedures: No procedures performed   Clinical Data: No additional findings.   Subjective: Chief Complaint  Patient presents with  . Right Foot - Pain    Candace Walker comes in today with lateral sided right ankle pain for about a month.  Denies any injuries.  She states that it hurts along the course of the peroneal tendons is worse with walking and standing and at the end of the day.  She works as a Chartered certified accountant at [employer].  She does endorse some swelling also.  Denies any subluxation of the tendons.  Ibuprofen makes her joints hurt.   Review of Systems  Constitutional: Negative.   HENT: Negative.   Eyes: Negative.   Respiratory: Negative.   Cardiovascular: Negative.   Endocrine: Negative.   Musculoskeletal: Negative.   Neurological: Negative.   Hematological: Negative.   Psychiatric/Behavioral: Negative.   All other systems reviewed and are  negative.    Objective: Vital Signs: Ht 5\' 8"  (1.727 m)   Wt 233 lb (105.7 kg)   LMP 11/07/2012   BMI 35.43 kg/m   Physical Exam Vitals signs and nursing note reviewed.  Constitutional:      Appearance: She is well-developed.  Pulmonary:     Effort: Pulmonary effort is normal.  Skin:    General: Skin is warm.     Capillary Refill: Capillary refill takes less than 2 seconds.  Neurological:     Mental Status: She is alert and oriented to person, place, and time.  Psychiatric:        Behavior: Behavior normal.        Thought Content: Thought content normal.        Judgment: Judgment normal.     Ortho Exam Right ankle exam shows tenderness along the peroneal tendons near the distal fibula.  There is no subluxation of the tendons.  There is discomfort with resisted ankle eversion. Specialty Comments:  No specialty comments available.  Imaging: No results found.   PMFS History: Patient Active Problem List   Diagnosis Date Noted  . Peroneal tendinitis, right leg 04/26/2018  . Right knee pain 09/09/2017  . Plantar fasciitis of left foot 09/09/2017  . S/P laparoscopic assisted vaginal hysterectomy (LAVH) 06/12/2013  .  Obesity (BMI 30-39.9) 04/25/2013  . Vitamin D deficiency   . Mixed hyperlipidemia   . Essential hypertension, benign   . Asthma with allergic rhinitis   . Allergic rhinitis    Past Medical History:  Diagnosis Date  . Allergic rhinitis, cause unspecified   . Arthritis    bursitis in hips, recently dx with avascular necrosis femor head  . Asthma with allergic rhinitis    rarely uses inhaler unless she gets sick  . Essential hypertension, benign   . Mixed hyperlipidemia    no meds, diet controlled  . Positive PPD    h/o BCG vaccine, negative chest xray  . SVD (spontaneous vaginal delivery)    x 2  . Unspecified vitamin D deficiency     Family History  Problem Relation Age of Onset  . Diabetes Mother   . Hypertension Mother   . Allergies Mother    . Colon polyps Mother 51  . Heart disease Father 51       MI  . Hyperlipidemia Father   . Hypertension Father   . Cancer Father 50       lung cancer (smoker)  . Lung cancer Father   . Cancer Paternal Grandmother        breast cancer in 49's  . Hypertension Sister     Past Surgical History:  Procedure Laterality Date  . ENDOMETRIAL ABLATION  2007   Dr. Corinna Capra  . LAPAROSCOPIC ASSISTED VAGINAL HYSTERECTOMY N/A 06/12/2013   Procedure: LAPAROSCOPIC ASSISTED VAGINAL HYSTERECTOMY;  Surgeon: Luz Lex, MD;  Location: [facility];  Service: Gynecology;  Laterality: N/A;  . LAPAROSCOPIC BILATERAL SALPINGECTOMY  06/12/2013   Procedure: LAPAROSCOPIC BILATERAL SALPINGECTOMY;  Surgeon: Luz Lex, MD;  Location: [facility];  Service: Gynecology;;  . TUBAL LIGATION    . WISDOM TOOTH EXTRACTION     right bottom lower tooth   Social History   Occupational History  . Occupation: Research scientist (life sciences): [employer]  Tobacco Use  . Smoking status: Never Smoker  . Smokeless tobacco: Never Used  Substance and Sexual Activity  . Alcohol use: Yes    Comment: occasional (once a month)  . Drug use: No  . Sexual activity: Not Currently    Birth control/protection: Surgical    Comment: ablation and tubal ligation and hysterectomy

## 2018-04-26 NOTE — Telephone Encounter (Signed)
Pt called in saying her ankle brace is big she thinks she needs a medium

## 2018-05-02 MED FILL — LISINOPRIL-HCTZ 20-12.5 MG: 20-12.5 | 90 days supply | Qty: 90 | Fill #1

## 2018-05-10 ENCOUNTER — Other Ambulatory Visit: Payer: 59

## 2018-07-29 MED FILL — LISINOPRIL-HCTZ 20-12.5 MG: 20-12.5 | 90 days supply | Qty: 90 | Fill #2

## 2018-09-01 ENCOUNTER — Other Ambulatory Visit: Payer: Self-pay

## 2018-09-01 ENCOUNTER — Encounter: Payer: Self-pay | Admitting: Family Medicine

## 2018-09-01 ENCOUNTER — Ambulatory Visit (INDEPENDENT_AMBULATORY_CARE_PROVIDER_SITE_OTHER): Payer: 59 | Admitting: Family Medicine

## 2018-09-01 VITALS — BP 122/78 | HR 76 | Temp 97.8°F | Ht 68.0 in | Wt 238.0 lb

## 2018-09-01 DIAGNOSIS — L03011 Cellulitis of right finger: Secondary | ICD-10-CM

## 2018-09-01 DIAGNOSIS — K2100 Gastro-esophageal reflux disease with esophagitis, without bleeding: Secondary | ICD-10-CM

## 2018-09-01 MED ORDER — DOXYCYCLINE HYCLATE 100 MG PO TABS: 100.0000 mg | ORAL_TABLET | Freq: Two times a day (BID) | ORAL | 0 refills | Status: DC

## 2018-09-01 MED FILL — DOXYCYCLINE HYCLATE 100 MG: 100 | 7 days supply | Qty: 14 | Fill #0

## 2018-09-01 NOTE — Progress Notes (Signed)
Chief Complaint  Patient presents with  . Wound Infection    thinks she may have an infection at the base of her thumb nail on her right hand, started about 2 weeks ago-she pulled the skin away.   Marland Kitchen Heartburn    has been having lots more heartburn lately.     She pulled some skin away from the left thumb (hangnail) 2 weeks ago.  It starting hurting more last week, feels more tender to touch.  She put some peroxide on it, and antibacterial ointment, which helped for a bit. Denies any drainage or redness, no fevers. Intermittently gets sharp pains, and it hurts to press on the area.  She has been getting heartburn more frequently, every day (mild) over the last 2 months, and some days gets a "bad discomfort". She tries to avoid acidic foods Denies any change in diet. Occurring after lunch (carrib[place] foods, not spicy per pt; rice, beans, chicken, vegetables); no tomatoes or pineapple (allergic, gives her hives). Meal size hasn't changed.  She got Aciphex from [facility], uses it only when it is bad, not taking it daily, and it helps.  PMH, PSH, SH reviewed  Outpatient Encounter Medications as of 09/01/2018  Medication Sig Note  . acetaminophen (TYLENOL) 500 MG tablet Take 1,000 mg by mouth every 6 (six) hours as needed.   . cholecalciferol (VITAMIN D) 1000 UNITS tablet Take 1,000 Units by mouth daily. Reported on 07/22/2015   . fexofenadine (ALLEGRA) 180 MG tablet Take 180 mg by mouth at bedtime. Reported on 07/22/2015   . fish oil-omega-3 fatty acids 1000 MG capsule Take 3 g by mouth daily. Reported on 07/22/2015   . lisinopril-hydrochlorothiazide (PRINZIDE,ZESTORETIC) 20-12.5 MG tablet Take 1 tablet by mouth daily.   . TURMERIC PO Take 1 capsule by mouth daily.   . VENTOLIN HFA 108 (90 Base) MCG/ACT inhaler INHALE 2 PUFFS INTO THE LUNGS EVERY 6 HOURS AS NEEDED FOR WHEEZING 09/01/2018: Used Saturday  . [DISCONTINUED] montelukast (SINGULAIR) 10 MG tablet TAKE 1 TABLET (10 MG TOTAL) BY MOUTH AT  BEDTIME.   Marland Kitchen ibuprofen (ADVIL,MOTRIN) 800 MG tablet Take 1 tablet (800 mg total) by mouth every 8 (eight) hours as needed. (Patient not taking: Reported on 09/01/2018)   . valACYclovir (VALTREX) 1000 MG tablet Take 2 tablets at onset of cold sore.  Repeat once 12 hours later (4 pills/course) (Patient not taking: Reported on 09/01/2018)   . [DISCONTINUED] meloxicam (MOBIC) 7.5 MG tablet Take 1 tablet (7.5 mg total) by mouth 2 (two) times daily as needed for pain.   . [DISCONTINUED] methocarbamol (ROBAXIN) 500 MG tablet Take 1 tablet (500 mg total) by mouth daily as needed for muscle spasms.   . [DISCONTINUED] methylPREDNISolone (MEDROL DOSEPAK) 4 MG TBPK tablet follow package directions    Facility-Administered Encounter Medications as of 09/01/2018  Medication  . albuterol (PROVENTIL) (2.5 MG/3ML) 0.083% nebulizer solution 2.5 mg   Allergies  Allergen Reactions  . Nexium [Esomeprazole Magnesium] Itching  . Penicillins Hives  . Percocet [Oxycodone-Acetaminophen] Itching   ROS: no fever, chills, URI symptoms.  She needed to use albuterol once recently related to a patient have MANY flowers delivered. Otherwise, no issues with allergies or asthma, doing well. No headaches, dizziness, chest pain, shortness of breath.  Heartburn per HPI, no nausea, vomiting, bowel changes. Moods are good.  See HPI  PHYSICAL EXAM:  BP 122/78   Pulse 76   Temp 97.8 F (36.6 C) (Temporal)   Ht 5\' 8"  (1.727 m)  Wt 238 lb (108 kg)   LMP 11/07/2012   BMI 36.19 kg/m   Wt Readings from Last 3 Encounters:  09/01/18 238 lb (108 kg)  04/26/18 233 lb (105.7 kg)  01/05/18 233 lb 6.4 oz (105.9 kg)   Pleasant female, in good spirits. HEEN: conjunctiva and sclera are clear.  Wearing mask Neck: no lymphadenopathy or mass Heart: regular rate and rhythm Lungs: clear bilaterally, no wheezing Abdomen: soft, nontender, no mass Extremities: no edema. 2+ pulses. Radial aspect of the right thumb, adjacent to the nail  there is an area of hyperpigmentation and firmness/induration. No warmth, no crusting or drainage.  ASSESSMENT/PLAN:  Paronychia of finger, right - frequent soaks; short course of doxycycline. f/u if worsening - Plan: doxycycline (VIBRA-TABS) 100 MG tablet  Gastroesophageal reflux disease with esophagitis - counseled re: diet, weight loss. use Aciphex daily x 2 weeks, then change to Pepcid if treatment still needed   Risks/SE of doxy, as well as PPI's reviewed in detail.  We received FMLA forms to fill out for patient as well. These were filled out--though noted that she has not had any acute visits related to asthma/allergies since 04/2017, so not sure if she really qualifies. Also, no longer seen 2x/year, just once yearly.  F/u for physical as scheduled in December.     Mild/early paronychia of the right thumb. Treat with warm soaks and doxycycline

## 2018-09-01 NOTE — Patient Instructions (Signed)
Soak your right thumb 3-4 times daily. Take the antibiotic twice daily for a week. Contact us if it is getting worse rather than better--swollen, red, hot, fever, swelling up the finger, to the wrist.  For your heartburn--take the Aciphex once daily before breastfast/lunch, and do this regularly for 2 weeks.  If you have heartburn recur upon stopping it, you can try taking Pepcid once or twice daily, which is a little safer long-term.  If it isn't as effective, you can go back to the aciphex.    Paronychia Paronychia is an infection of the skin that surrounds a nail. It usually affects the skin around a fingernail, but it may also occur near a toenail. It often causes pain and swelling around the nail. In some cases, a collection of pus (abscess) can form near or under the nail.  This condition may develop suddenly, or it may develop gradually over a longer period. In most cases, paronychia is not serious, and it will clear up with treatment. What are the causes? This condition may be caused by bacteria or a fungus. These germs can enter the body through an opening in the skin, such as a cut or a hangnail. What increases the risk? This condition is more likely to develop in people who:  Get their hands wet often, such as those who work as Designer, industrial/product, bartenders, or nurses.  Bite their fingernails or suck their thumbs.  Trim their nails very short.  Have hangnails or injured fingertips.  Get manicures.  Have diabetes. What are the signs or symptoms? Symptoms of this condition include:  Redness and swelling of the skin near the nail.  Tenderness around the nail when you touch the area.  Pus-filled bumps under the skin at the base and sides of the nail (cuticle).  Fluid or pus under the nail.  Throbbing pain in the area. How is this diagnosed? This condition is diagnosed with a physical exam. In some cases, a sample of pus may be tested to determine what type of bacteria or  fungus is causing the condition. How is this treated? Treatment depends on the cause and severity of your condition. If your condition is mild, it may clear up on its own in a few days or after soaking in warm water. If needed, treatment may include:  Antibiotic medicine, if your infection is caused by bacteria.  Antifungal medicine, if your infection is caused by a fungus.  A procedure to drain pus from an abscess.  Anti-inflammatory medicine (corticosteroids). Follow these instructions at home: Wound care  Keep the affected area clean.  Soak the affected area in warm water, if told to do so by your health care provider. You may be told to do this for 20 minutes, 2-3 times a day.  Keep the area dry when you are not soaking it.  Do not try to drain an abscess yourself.  Follow instructions from your health care provider about how to take care of the affected area. Make sure you: ? Wash your hands with soap and water before you change your bandage (dressing). If soap and water are not available, use hand sanitizer. ? Change your dressing as told by your health care provider.  If you had an abscess drained, check the area every day for signs of infection. Check for: ? Redness, swelling, or pain. ? Fluid or blood. ? Warmth. ? Pus or a bad smell. Medicines   Take over-the-counter and prescription medicines only as told by your health  care provider.  If you were prescribed an antibiotic medicine, take it as told by your health care provider. Do not stop taking the antibiotic even if you start to feel better. General instructions  Avoid contact with harsh chemicals.  Do not pick at the affected area. Prevention  To prevent this condition from happening again: ? Wear rubber gloves when washing dishes or doing other tasks that require your hands to get wet. ? Wear gloves if your hands might come in contact with cleaners or other chemicals. ? Avoid injuring your nails or  fingertips. ? Do not bite your nails or tear hangnails. ? Do not cut your nails very short. ? Do not cut your cuticles. ? Use clean nail clippers or scissors when trimming nails. Contact a health care provider if:  Your symptoms get worse or do not improve with treatment.  You have continued or increased fluid, blood, or pus coming from the affected area.  Your finger or knuckle becomes swollen or difficult to move. Get help right away if you have:  A fever or chills.  Redness spreading away from the affected area.  Joint or muscle pain. Summary  Paronychia is an infection of the skin that surrounds a nail. It often causes pain and swelling around the nail. In some cases, a collection of pus (abscess) can form near or under the nail.  This condition may be caused by bacteria or a fungus. These germs can enter the body through an opening in the skin, such as a cut or a hangnail.  If your condition is mild, it may clear up on its own in a few days. If needed, treatment may include medicine or a procedure to drain pus from an abscess.  To prevent this condition from happening again, wear gloves if doing tasks that require your hands to get wet or to come in contact with chemicals. Also avoid injuring your nails or fingertips. This information is not intended to replace advice given to you by your health care provider. Make sure you discuss any questions you have with your health care provider. Document Released: 07/29/2000 Document Revised: 02/19/2017 Document Reviewed: 02/15/2017 Elsevier Patient Education  2020 [place] for Gastroesophageal Reflux Disease, Adult When you have gastroesophageal reflux disease (GERD), the foods you eat and your eating habits are very important. Choosing the right foods can help ease the discomfort of GERD. Consider working with a diet and nutrition specialist (dietitian) to help you make healthy food choices. What general  guidelines should I follow?  Eating plan  Choose healthy foods low in fat, such as fruits, vegetables, whole grains, low-fat dairy products, and lean meat, fish, and poultry.  Eat frequent, small meals instead of three large meals each day. Eat your meals slowly, in a relaxed setting. Avoid bending over or lying down until 2-3 hours after eating.  Limit high-fat foods such as fatty meats or fried foods.  Limit your intake of oils, butter, and shortening to less than 8 teaspoons each day.  Avoid the following: ? Foods that cause symptoms. These may be different for different people. Keep a food diary to keep track of foods that cause symptoms. ? Alcohol. ? Drinking large amounts of liquid with meals. ? Eating meals during the 2-3 hours before bed.  Cook foods using methods other than frying. This may include baking, grilling, or broiling. Lifestyle  Maintain a healthy weight. Ask your health care provider what weight is healthy for you.  If you need to lose weight, work with your health care provider to do so safely.  Exercise for at least 30 minutes on 5 or more days each week, or as told by your health care provider.  Avoid wearing clothes that fit tightly around your waist and chest.  Do not use any products that contain nicotine or tobacco, such as cigarettes and e-cigarettes. If you need help quitting, ask your health care provider.  Sleep with the head of your bed raised. Use a wedge under the mattress or blocks under the bed frame to raise the head of the bed. What foods are not recommended? The items listed may not be a complete list. Talk with your dietitian about what dietary choices are best for you. Grains Pastries or quick breads with added fat. [place] toast. Vegetables Deep fried vegetables. [place] fries. Any vegetables prepared with added fat. Any vegetables that cause symptoms. For some people this may include tomatoes and tomato products, chili peppers, onions and  garlic, and horseradish. Fruits Any fruits prepared with added fat. Any fruits that cause symptoms. For some people this may include citrus fruits, such as oranges, grapefruit, pineapple, and lemons. Meats and other protein foods High-fat meats, such as fatty beef or pork, hot dogs, ribs, ham, sausage, salami and bacon. Fried meat or protein, including fried fish and fried chicken. Nuts and nut butters. Dairy Whole milk and chocolate milk. Sour cream. Cream. Ice cream. Cream cheese. Milk shakes. Beverages Coffee and tea, with or without caffeine. Carbonated beverages. Sodas. Energy drinks. Fruit juice made with acidic fruits (such as orange or grapefruit). Tomato juice. Alcoholic drinks. Fats and oils Butter. Margarine. Shortening. Ghee. Sweets and desserts Chocolate and cocoa. Donuts. Seasoning and other foods Pepper. Peppermint and spearmint. Any condiments, herbs, or seasonings that cause symptoms. For some people, this may include curry, hot sauce, or vinegar-based salad dressings. Summary  When you have gastroesophageal reflux disease (GERD), food and lifestyle choices are very important to help ease the discomfort of GERD.  Eat frequent, small meals instead of three large meals each day. Eat your meals slowly, in a relaxed setting. Avoid bending over or lying down until 2-3 hours after eating.  Limit high-fat foods such as fatty meat or fried foods. This information is not intended to replace advice given to you by your health care provider. Make sure you discuss any questions you have with your health care provider. Document Released: 02/02/2005 Document Revised: 05/26/2018 Document Reviewed: 02/04/2016 Elsevier Patient Education  2020 [employer].

## 2018-09-08 ENCOUNTER — Ambulatory Visit: Payer: 59 | Admitting: Family Medicine

## 2018-09-08 ENCOUNTER — Telehealth: Payer: Self-pay | Admitting: Family Medicine

## 2018-09-08 ENCOUNTER — Encounter: Payer: Self-pay | Admitting: Family Medicine

## 2018-09-08 NOTE — Telephone Encounter (Signed)
See if she can send a picture through [place].  It might help me know whether or not she needs to come in and have it lanced

## 2018-09-08 NOTE — Telephone Encounter (Signed)
It looks a little bigger.  Offer appt and end of day or tomorrow with JCL.  Continue warm compresses and elevation in the meanwhile.

## 2018-09-08 NOTE — Telephone Encounter (Signed)
Pt called and feels like the antibiotic that was given to her at her last visit is not working. She said her finger is still throbbing

## 2018-09-08 NOTE — Telephone Encounter (Signed)
Patient has sent 2 images via mychart. Please view.

## 2018-09-08 NOTE — Telephone Encounter (Signed)
Patient has an appointment at 3:45 today

## 2018-09-08 NOTE — Telephone Encounter (Signed)
Noted, though it appears that she cancelled this appt

## 2018-09-13 DIAGNOSIS — H11051 Peripheral pterygium, progressive, right eye: Secondary | ICD-10-CM | POA: Diagnosis not present

## 2018-10-28 MED FILL — LISINOPRIL-HCTZ 20-12.5 MG: 20-12.5 | 90 days supply | Qty: 90 | Fill #3

## 2018-11-07 ENCOUNTER — Ambulatory Visit (INDEPENDENT_AMBULATORY_CARE_PROVIDER_SITE_OTHER): Payer: 59 | Admitting: Gastroenterology

## 2018-11-07 ENCOUNTER — Encounter: Payer: Self-pay | Admitting: Gastroenterology

## 2018-11-07 VITALS — BP 104/70 | HR 68 | Temp 98.2°F | Ht 68.0 in | Wt 236.0 lb

## 2018-11-07 DIAGNOSIS — Z8371 Family history of colonic polyps: Secondary | ICD-10-CM

## 2018-11-07 MED ORDER — SUPREP BOWEL PREP KIT 17.5-3.13-1.6 GM/177ML PO SOLN: ORAL | 0 refills | Status: DC

## 2018-11-07 MED FILL — SUPREP BOWEL PREP KIT: 17.5-3.13-1 | 1 days supply | Qty: 354 | Fill #0

## 2018-11-07 NOTE — Patient Instructions (Signed)
If you are age 49 or older, your body mass index should be between 23-30. Your Body mass index is 35.88 kg/m. If this is out of the aforementioned range listed, please consider follow up with your Primary Care Provider.  If you are age 33 or younger, your body mass index should be between 19-25. Your Body mass index is 35.88 kg/m. If this is out of the aformentioned range listed, please consider follow up with your Primary Care Provider.   To help prevent the possible spread of infection to our patients, communities, and staff; we will be implementing the following measures:  As of now we are not allowing any visitors/family members to accompany you to any upcoming appointments with [facility]. If you have any concerns about this please contact our office to discuss prior to the appointment.   You have been scheduled for a colonoscopy. Please follow written instructions given to you at your visit today.  Please pick up your prep supplies at the pharmacy within the next 1-3 days. If you use inhalers (even only as needed), please bring them with you on the day of your procedure. Your physician has requested that you go to www.startemmi.com and enter the access code given to you at your visit today. This web site gives a general overview about your procedure. However, you should still follow specific instructions given to you by our office regarding your preparation for the procedure.  Thank you for entrusting me with your care and for choosing [facility], Dr. Burwell Cellar

## 2018-11-07 NOTE — Progress Notes (Signed)
HPI :  48 y/o AA female with a history of asthma, HTN, HLD, referred by Rita Ohara MD for discussion of colonoscopy.   Her mother has had numerous colon polyps starting at age 49, and she also has a sister who recently had colon polyps removed.  She denies any known family history of colon cancer.  She denies any trouble with her bowel habits.  No constipation diarrhea.  No blood in her stools.  She denies any abdominal pains that bother her routinely.  She has never had a prior colonoscopy.  She states her asthma is well controlled.  She has never had an issue with anesthesia in the past.     Past Medical History:  Diagnosis Date  . Allergic rhinitis, cause unspecified   . Arthritis    bursitis in hips, recently dx with avascular necrosis femor head  . Asthma with allergic rhinitis    rarely uses inhaler unless she gets sick  . Essential hypertension, benign   . Mixed hyperlipidemia    no meds, diet controlled  . Positive PPD    h/o BCG vaccine, negative chest xray  . SVD (spontaneous vaginal delivery)    x 2  . Unspecified vitamin D deficiency      Past Surgical History:  Procedure Laterality Date  . ENDOMETRIAL ABLATION  2007   Dr. Corinna Capra  . LAPAROSCOPIC ASSISTED VAGINAL HYSTERECTOMY N/A 06/12/2013   Procedure: LAPAROSCOPIC ASSISTED VAGINAL HYSTERECTOMY;  Surgeon: Luz Lex, MD;  Location: [facility];  Service: Gynecology;  Laterality: N/A;  . LAPAROSCOPIC BILATERAL SALPINGECTOMY  06/12/2013   Procedure: LAPAROSCOPIC BILATERAL SALPINGECTOMY;  Surgeon: Luz Lex, MD;  Location: [facility];  Service: Gynecology;;  . TUBAL LIGATION    . WISDOM TOOTH EXTRACTION     right bottom lower tooth   Family History  Problem Relation Age of Onset  . Diabetes Mother   . Hypertension Mother   . Allergies Mother   . Colon polyps Mother 63  . Heart disease Father 46       MI  . Hyperlipidemia Father   . Hypertension Father   . Lung cancer Father 3       smoker  . Breast cancer  Paternal Grandmother   . Hypertension Sister   . Colon cancer Neg Hx   . Esophageal cancer Neg Hx   . Liver disease Neg Hx   . Stomach cancer Neg Hx    Social History   Tobacco Use  . Smoking status: Never Smoker  . Smokeless tobacco: Never Used  Substance Use Topics  . Alcohol use: Yes    Comment: occasional (once a month)  . Drug use: No   Current Outpatient Medications  Medication Sig Dispense Refill  . acetaminophen (TYLENOL) 500 MG tablet Take 1,000 mg by mouth every 6 (six) hours as needed.    . Ascorbic Acid (VITAMIN C) 1000 MG tablet Take 1,000 mg by mouth daily.    . cholecalciferol (VITAMIN D) 1000 UNITS tablet Take 1,000 Units by mouth daily. Reported on 07/22/2015    . fexofenadine (ALLEGRA) 180 MG tablet Take 180 mg by mouth at bedtime. Reported on 07/22/2015    . fish oil-omega-3 fatty acids 1000 MG capsule Take 3 g by mouth daily. Reported on 07/22/2015    . lisinopril-hydrochlorothiazide (PRINZIDE,ZESTORETIC) 20-12.5 MG tablet Take 1 tablet by mouth daily. 90 tablet 3  . TURMERIC PO Take 1 capsule by mouth daily.    . valACYclovir (VALTREX) 1000 MG tablet  Take 2 tablets at onset of cold sore.  Repeat once 12 hours later (4 pills/course) 20 tablet 0  . VENTOLIN HFA 108 (90 Base) MCG/ACT inhaler INHALE 2 PUFFS INTO THE LUNGS EVERY 6 HOURS AS NEEDED FOR WHEEZING 18 g 1   No current facility-administered medications for this visit.    Allergies  Allergen Reactions  . Nexium [Esomeprazole Magnesium] Hives, Itching and Swelling  . Penicillins Hives  . Percocet [Oxycodone-Acetaminophen] Itching     Review of Systems: All systems reviewed and negative except where noted in HPI.   Lab Results  Component Value Date   WBC 3.2 (L) 01/03/2018   HGB 12.4 01/03/2018   HCT 38.2 01/03/2018   MCV 89 01/03/2018   PLT 146 (L) 01/03/2018    Lab Results  Component Value Date   CREATININE 1.08 (H) 01/03/2018   BUN 18 01/03/2018   NA 137 01/03/2018   K 4.1 01/03/2018   CL  100 01/03/2018   CO2 24 01/03/2018    Lab Results  Component Value Date   ALT 22 01/03/2018   AST 26 01/03/2018   ALKPHOS 61 01/03/2018   BILITOT 0.4 01/03/2018     Physical Exam: BP 104/70   Pulse 68   Temp 98.2 F (36.8 C)   Ht 5\' 8"  (1.727 m)   Wt 236 lb (107 kg)   LMP 11/07/2012   BMI 35.88 kg/m  Constitutional: Pleasant,well-developed, female in no acute distress. HEENT: Normocephalic and atraumatic. Conjunctivae are normal. No scleral icterus. Neck supple.  Cardiovascular: Normal rate, regular rhythm.  Pulmonary/chest: Effort normal and breath sounds normal. No wheezing, rales or rhonchi. Abdominal: Soft, nondistended, nontender.  There are no masses palpable. No hepatomegaly. Extremities: no edema Lymphadenopathy: No cervical adenopathy noted. Neurological: Alert and oriented to person place and time. Skin: Skin is warm and dry. No rashes noted. Psychiatric: Normal mood and affect. Behavior is normal.   ASSESSMENT AND PLAN: 48 year old female here for new patient assessment of the following:  Family history of colon polyps - mother and sister with numerous colon polyps.  She has never had prior colon cancer screening.  Given her family history and her ethnicity she is due for colon cancer screening at this time.  Given her family history, optical colonoscopy is recommended over stool based testing. I discussed risks and benefits and following this discussion she wanted to proceed.  Further recommendations pending results.  Harman Cellar, MD [facility]  CC: Rita Ohara, MD

## 2018-11-08 ENCOUNTER — Telehealth: Payer: Self-pay | Admitting: Gastroenterology

## 2018-11-08 NOTE — Telephone Encounter (Signed)
Called and advised  Patient that I will leave a Sample of Suprep up front for her to pick up.  She indicated she will be able to pick it up on Thursday afternoon.

## 2018-11-15 ENCOUNTER — Encounter: Payer: Self-pay | Admitting: Gastroenterology

## 2018-11-28 ENCOUNTER — Telehealth: Payer: Self-pay

## 2018-11-28 ENCOUNTER — Telehealth: Payer: Self-pay | Admitting: *Deleted

## 2018-11-28 NOTE — Telephone Encounter (Signed)
Called patient to ask if she could come 30 minutes early for tomorrow's procedure.  Patient agreed to come in at 1 PM

## 2018-11-28 NOTE — Telephone Encounter (Signed)
Covid-19 screening questions   Do you now or have you had a fever in the last 14 days?  Do you have any respiratory symptoms of shortness of breath or cough now or in the last 14 days?  Do you have any family members or close contacts with diagnosed or suspected Covid-19 in the past 14 days?  Have you been tested for Covid-19 and found to be positive?       

## 2018-11-28 NOTE — Telephone Encounter (Signed)
Pt answered ""NO"" to all Covid questions

## 2018-11-29 ENCOUNTER — Other Ambulatory Visit: Payer: Self-pay

## 2018-11-29 ENCOUNTER — Ambulatory Visit (AMBULATORY_SURGERY_CENTER): Payer: 59 | Admitting: Gastroenterology

## 2018-11-29 ENCOUNTER — Encounter: Payer: Self-pay | Admitting: Gastroenterology

## 2018-11-29 VITALS — BP 136/82 | HR 59 | Temp 98.7°F | Resp 11 | Ht 68.0 in | Wt 236.0 lb

## 2018-11-29 DIAGNOSIS — Z8371 Family history of colonic polyps: Secondary | ICD-10-CM

## 2018-11-29 DIAGNOSIS — Z1211 Encounter for screening for malignant neoplasm of colon: Secondary | ICD-10-CM | POA: Diagnosis not present

## 2018-11-29 MED ORDER — SODIUM CHLORIDE 0.9 % IV SOLN: 500.0000 mL | Freq: Once | INTRAVENOUS | Status: DC

## 2018-11-29 NOTE — Progress Notes (Signed)
Temperature taken by J.B., VS taken by C.W. 

## 2018-11-29 NOTE — Progress Notes (Signed)
PT taken to PACU. Monitors in place. VSS. Report given to RN. 

## 2018-11-29 NOTE — Patient Instructions (Signed)
Handout provided on Hemorrhoids.  You may resume your previous diet and medication schedule.  It is recommended you have a repeat colonoscopy in 5 years due to a family history of colon polyps.  YOU HAD AN ENDOSCOPIC PROCEDURE TODAY AT [facility]:   Refer to the procedure report that was given to you for any specific questions about what was found during the examination.  If the procedure report does not answer your questions, please call your gastroenterologist to clarify.  If you requested that your care partner not be given the details of your procedure findings, then the procedure report has been included in a sealed envelope for you to review at your convenience later.  YOU SHOULD EXPECT: Some feelings of bloating in the abdomen. Passage of more gas than usual.  Walking can help get rid of the air that was put into your GI tract during the procedure and reduce the bloating. If you had a lower endoscopy (such as a colonoscopy or flexible sigmoidoscopy) you may notice spotting of blood in your stool or on the toilet paper. If you underwent a bowel prep for your procedure, you may not have a normal bowel movement for a few days.  Please Note:  You might notice some irritation and congestion in your nose or some drainage.  This is from the oxygen used during your procedure.  There is no need for concern and it should clear up in a day or so.  SYMPTOMS TO REPORT IMMEDIATELY:   Following lower endoscopy (colonoscopy or flexible sigmoidoscopy):  Excessive amounts of blood in the stool  Significant tenderness or worsening of abdominal pains  Swelling of the abdomen that is new, acute  Fever of 100F or higher   For urgent or emergent issues, a gastroenterologist can be reached at any hour by calling [phone].   DIET:  We do recommend a small meal at first, but then you may proceed to your regular diet.  Drink plenty of fluids but you should avoid alcoholic beverages for  24 hours.  ACTIVITY:  You should plan to take it easy for the rest of today and you should NOT DRIVE or use heavy machinery until tomorrow (because of the sedation medicines used during the test).    FOLLOW UP: Our staff will call the number listed on your records 48-72 hours following your procedure to check on you and address any questions or concerns that you may have regarding the information given to you following your procedure. If we do not reach you, we will leave a message.  We will attempt to reach you two times.  During this call, we will ask if you have developed any symptoms of COVID 19. If you develop any symptoms (ie: fever, flu-like symptoms, shortness of breath, cough etc.) before then, please call [phone].  If you test positive for Covid 19 in the 2 weeks post procedure, please call and report this information to [place].    If any biopsies were taken you will be contacted by phone or by letter within the next 1-3 weeks.  Please call us at [phone] if you have not heard about the biopsies in 3 weeks.    SIGNATURES/CONFIDENTIALITY: You and/or your care partner have signed paperwork which will be entered into your electronic medical record.  These signatures attest to the fact that that the information above on your After Visit Summary has been reviewed and is understood.  Full responsibility of the confidentiality of this  discharge information lies with you and/or your care-partner. 

## 2018-11-29 NOTE — Op Note (Signed)
Candace Walker: Candace Walker Procedure Date: 11/29/2018 12:48 PM MRN: [id number] Endoscopist: Remo Lipps P. Havery Moros , MD Age: 48 Referring MD:  Date of Birth: 1970/03/26 Gender: Female Account #: [id number] Procedure:                Colonoscopy Indications:              Colon cancer screening in patient at increased                            risk: Family history of colon polyps in multiple                            1st-degree relatives (mother age 85s, sister) Medicines:                Monitored Anesthesia Care Procedure:                Pre-Anesthesia Assessment:                           - Prior to the procedure, a History and Physical                            was performed, and patient medications and                            allergies were reviewed. The patient's tolerance of                            previous anesthesia was also reviewed. The risks                            and benefits of the procedure and the sedation                            options and risks were discussed with the patient.                            All questions were answered, and informed consent                            was obtained. Prior Anticoagulants: The patient has                            taken no previous anticoagulant or antiplatelet                            agents. ASA Grade Assessment: II - A patient with                            mild systemic disease. After reviewing the risks                            and benefits, the patient was deemed in  satisfactory condition to undergo the procedure.                           After obtaining informed consent, the colonoscope                            was passed under direct vision. Throughout the                            procedure, the patient's blood pressure, pulse, and                            oxygen saturations were monitored continuously. The   Colonoscope was introduced through the anus and                            advanced to the the cecum, identified by                            appendiceal orifice and ileocecal valve. The                            colonoscopy was performed without difficulty. The                            patient tolerated the procedure well. The quality                            of the bowel preparation was good. The ileocecal                            valve, appendiceal orifice, and rectum were                            photographed. Scope In: 2:05:24 PM Scope Out: 2:25:45 PM Scope Withdrawal Time: 0 hours 16 minutes 59 seconds  Total Procedure Duration: 0 hours 20 minutes 21 seconds  Findings:                 The perianal and digital rectal examinations were                            normal.                           The colon was tortuous.                           Internal hemorrhoids were found during                            retroflexion. The hemorrhoids were small.                           The exam was otherwise without abnormality. No  polyps. Complications:            No immediate complications. Estimated blood loss:                            None. Estimated Blood Loss:     Estimated blood loss: none. Impression:               - Tortuous colon.                           - Internal hemorrhoids.                           - The examination was otherwise normal.                           - No polyps. Recommendation:           - Patient has a contact number available for                            emergencies. The signs and symptoms of potential                            delayed complications were discussed with the                            patient. Return to normal activities tomorrow.                            Written discharge instructions were provided to the                            patient.                           - Resume previous diet.                            - Continue present medications.                           - Consideration for repeat colonoscopy in 5 years                            for screening purposes given multiple first degree                            relatives with colon polyps. Would be useful to                            obtain information on mother's and sister's polyp                            history to make more definitive recommendations. Remo Lipps P. Adileny Delon, MD 11/29/2018 2:31:03 PM This report has been signed electronically.

## 2018-12-01 ENCOUNTER — Telehealth: Payer: Self-pay

## 2018-12-01 NOTE — Telephone Encounter (Signed)
  Follow up Call-  Call back number 11/29/2018  Post procedure Call Back phone  # [phone]  Permission to leave phone message Yes  Some recent data might be hidden     Patient questions:  Do you have a fever, pain , or abdominal swelling? No. Pain Score  0 *  Have you tolerated food without any problems? Yes.    Have you been able to return to your normal activities? Yes.    Do you have any questions about your discharge instructions: Diet   No. Medications  No. Follow up visit  No.  Do you have questions or concerns about your Care? No.  Actions: * If pain score is 4 or above: No action needed, pain <4.  1. Have you developed a fever since your procedure? no  2.   Have you had an respiratory symptoms (SOB or cough) since your procedure? no  3.   Have you tested positive for COVID 19 since your procedure no  4.   Have you had any family members/close contacts diagnosed with the COVID 19 since your procedure?  no   If yes to any of these questions please route to Joylene John, RN and Alphonsa Gin, Therapist, sports.

## 2018-12-30 MED FILL — MELOXICAM 7.5 MG TABLET: 7.5 | 15 days supply | Qty: 30 | Fill #1

## 2019-01-03 ENCOUNTER — Other Ambulatory Visit: Payer: Self-pay | Admitting: Family Medicine

## 2019-01-03 DIAGNOSIS — Z5181 Encounter for therapeutic drug level monitoring: Secondary | ICD-10-CM

## 2019-01-03 DIAGNOSIS — E782 Mixed hyperlipidemia: Secondary | ICD-10-CM

## 2019-01-03 DIAGNOSIS — Z0000 Encounter for general adult medical examination without abnormal findings: Secondary | ICD-10-CM

## 2019-01-04 ENCOUNTER — Other Ambulatory Visit: Payer: Self-pay

## 2019-01-04 ENCOUNTER — Encounter: Payer: Self-pay | Admitting: Orthopaedic Surgery

## 2019-01-04 ENCOUNTER — Ambulatory Visit (INDEPENDENT_AMBULATORY_CARE_PROVIDER_SITE_OTHER): Payer: 59 | Admitting: Orthopaedic Surgery

## 2019-01-04 ENCOUNTER — Ambulatory Visit (INDEPENDENT_AMBULATORY_CARE_PROVIDER_SITE_OTHER): Payer: 59

## 2019-01-04 ENCOUNTER — Ambulatory Visit: Payer: Self-pay

## 2019-01-04 DIAGNOSIS — M25511 Pain in right shoulder: Secondary | ICD-10-CM | POA: Diagnosis not present

## 2019-01-04 DIAGNOSIS — M25512 Pain in left shoulder: Secondary | ICD-10-CM | POA: Diagnosis not present

## 2019-01-04 DIAGNOSIS — G8929 Other chronic pain: Secondary | ICD-10-CM

## 2019-01-04 NOTE — Progress Notes (Signed)
Office Visit Note   Patient: Candace Walker           Date of Birth: [birth year]           MRN: [id number] Visit Date: 01/04/2019              Requested by: Rita Ohara, [address],  [place] [address] PCP: Rita Ohara, MD   Assessment & Plan: Visit Diagnoses:  1. Chronic pain of both shoulders     Plan: My impression is bilateral trapezial pain.  We will obtain arthritis panel to rule out autoimmune disorders.  I have also made referral to outpatient PT for evaluation.  Questions encouraged and answered.  Follow-up as needed.  Follow-Up Instructions: Return if symptoms worsen or fail to improve.   Orders:  Orders Placed This Encounter  Procedures  . XR Shoulder Right  . XR Shoulder Left  . Ambulatory referral to Physical Therapy   No orders of the defined types were placed in this encounter.     Procedures: No procedures performed   Clinical Data: No additional findings.   Subjective: Chief Complaint  Patient presents with  . Left Shoulder - Pain  . Right Shoulder - Pain    Candace Walker is a 48 year old female comes in for evaluation of bilateral shoulder pain for about the last 5 months.  She sounds like she has a strong family history of autoimmune disorders.  She denies any injuries.  She states that she feels chronic soreness and numbness and pain with activity that is worse at night.  Denies any true radicular symptoms.   Review of Systems  Constitutional: Negative.   HENT: Negative.   Eyes: Negative.   Respiratory: Negative.   Cardiovascular: Negative.   Endocrine: Negative.   Musculoskeletal: Negative.   Neurological: Negative.   Hematological: Negative.   Psychiatric/Behavioral: Negative.   All other systems reviewed and are negative.    Objective: Vital Signs: LMP 11/07/2012   Physical Exam Vitals signs and nursing note reviewed.  Constitutional:      Appearance: She is well-developed.  HENT:     Head: Normocephalic  and atraumatic.  Neck:     Musculoskeletal: Neck supple.  Pulmonary:     Effort: Pulmonary effort is normal.  Abdominal:     Palpations: Abdomen is soft.  Skin:    General: Skin is warm.     Capillary Refill: Capillary refill takes less than 2 seconds.  Neurological:     Mental Status: She is alert and oriented to person, place, and time.  Psychiatric:        Behavior: Behavior normal.        Thought Content: Thought content normal.        Judgment: Judgment normal.     Ortho Exam Shoulder exams are relatively unremarkable.  Cervical spine is nontender.  Bilateral trapezius muscles are tender to palpation.  Negative Spurling's.  No focal motor or sensory deficits.  Normal reflexes.  Specialty Comments:  No specialty comments available.  Imaging: Xr Shoulder Left  Result Date: 01/04/2019 No acute or structural abnormalities  Xr Shoulder Right  Result Date: 01/04/2019 No acute or structural abnormalities    PMFS History: Patient Active Problem List   Diagnosis Date Noted  . Peroneal tendinitis, right leg 04/26/2018  . Right knee pain 09/09/2017  . Plantar fasciitis of left foot 09/09/2017  . S/P laparoscopic assisted vaginal hysterectomy (LAVH) 06/12/2013  . Obesity (BMI 30-39.9) 04/25/2013  . Vitamin D deficiency   .  Mixed hyperlipidemia   . Essential hypertension, benign   . Asthma with allergic rhinitis   . Allergic rhinitis    Past Medical History:  Diagnosis Date  . Allergic rhinitis, cause unspecified   . Allergy   . Arthritis    bursitis in hips, recently dx with avascular necrosis femor head  . Asthma with allergic rhinitis    rarely uses inhaler unless she gets sick  . Essential hypertension, benign   . Heart murmur   . Mixed hyperlipidemia    no meds, diet controlled  . Positive PPD    h/o BCG vaccine, negative chest xray  . SVD (spontaneous vaginal delivery)    x 2  . Unspecified vitamin D deficiency     Family History  Problem Relation  Age of Onset  . Diabetes Mother   . Hypertension Mother   . Allergies Mother   . Colon polyps Mother 51  . Heart disease Father 17       MI  . Hyperlipidemia Father   . Hypertension Father   . Lung cancer Father 41       smoker  . Breast cancer Paternal Grandmother   . Hypertension Sister   . Colon cancer Neg Hx   . Esophageal cancer Neg Hx   . Liver disease Neg Hx   . Stomach cancer Neg Hx   . Rectal cancer Neg Hx     Past Surgical History:  Procedure Laterality Date  . ENDOMETRIAL ABLATION  2007   Dr. Corinna Capra  . LAPAROSCOPIC ASSISTED VAGINAL HYSTERECTOMY N/A 06/12/2013   Procedure: LAPAROSCOPIC ASSISTED VAGINAL HYSTERECTOMY;  Surgeon: Luz Lex, MD;  Location: [facility];  Service: Gynecology;  Laterality: N/A;  . LAPAROSCOPIC BILATERAL SALPINGECTOMY  06/12/2013   Procedure: LAPAROSCOPIC BILATERAL SALPINGECTOMY;  Surgeon: Luz Lex, MD;  Location: [facility];  Service: Gynecology;;  . TUBAL LIGATION    . WISDOM TOOTH EXTRACTION     right bottom lower tooth   Social History   Occupational History  . Occupation: Research scientist (life sciences): [employer]  Tobacco Use  . Smoking status: Never Smoker  . Smokeless tobacco: Never Used  Substance and Sexual Activity  . Alcohol use: Yes    Comment: occasional (once a month)  . Drug use: No  . Sexual activity: Not Currently    Birth control/protection: Surgical    Comment: ablation and tubal ligation and hysterectomy

## 2019-01-04 NOTE — Addendum Note (Signed)
Addended by: Precious Bard on: 01/04/2019 03:19 PM   Modules accepted: Orders

## 2019-01-06 ENCOUNTER — Other Ambulatory Visit: Payer: 59

## 2019-01-06 ENCOUNTER — Other Ambulatory Visit: Payer: Self-pay

## 2019-01-06 DIAGNOSIS — E782 Mixed hyperlipidemia: Secondary | ICD-10-CM | POA: Diagnosis not present

## 2019-01-06 DIAGNOSIS — Z0000 Encounter for general adult medical examination without abnormal findings: Secondary | ICD-10-CM

## 2019-01-06 DIAGNOSIS — Z5181 Encounter for therapeutic drug level monitoring: Secondary | ICD-10-CM | POA: Diagnosis not present

## 2019-01-06 LAB — URIC ACID: Uric Acid, Serum: 6.4 mg/dL (ref 2.5–7.0)

## 2019-01-06 LAB — SEDIMENTATION RATE: Sed Rate: 28 mm/h — ABNORMAL HIGH (ref 0–20)

## 2019-01-06 LAB — ANA: Anti Nuclear Antibody (ANA): NEGATIVE

## 2019-01-06 LAB — RHEUMATOID FACTOR: Rheumatoid fact SerPl-aCnc: <14 IU/mL (ref <14)

## 2019-01-07 LAB — CBC WITH DIFFERENTIAL/PLATELET
Basophils Absolute: 0 10*3/uL (ref 0.0–0.2)
Basos: 0 %
EOS (ABSOLUTE): 0 10*3/uL (ref 0.0–0.4)
Eos: 1 %
Hematocrit: 37.7 % (ref 34.0–46.6)
Hemoglobin: 12.7 g/dL (ref 11.1–15.9)
Immature Grans (Abs): 0 10*3/uL (ref 0.0–0.1)
Immature Granulocytes: 0 %
Lymphocytes Absolute: 1.6 10*3/uL (ref 0.7–3.1)
Lymphs: 53 %
MCH: 29.1 pg (ref 26.6–33.0)
MCHC: 33.7 g/dL (ref 31.5–35.7)
MCV: 86 fL (ref 79–97)
Monocytes Absolute: 0.2 10*3/uL (ref 0.1–0.9)
Monocytes: 7 %
Neutrophils Absolute: 1.2 10*3/uL — ABNORMAL LOW (ref 1.4–7.0)
Neutrophils: 39 %
Platelets: 176 10*3/uL (ref 150–450)
RBC: 4.37 x10E6/uL (ref 3.77–5.28)
RDW: 13.1 % (ref 11.7–15.4)
WBC: 3 10*3/uL — ABNORMAL LOW (ref 3.4–10.8)

## 2019-01-07 LAB — COMPREHENSIVE METABOLIC PANEL
ALT: 11 IU/L (ref 0–32)
AST: 22 IU/L (ref 0–40)
Albumin/Globulin Ratio: 1.2 (ref 1.2–2.2)
Albumin: 4.1 g/dL (ref 3.8–4.8)
Alkaline Phosphatase: 65 IU/L (ref 39–117)
BUN/Creatinine Ratio: 13 (ref 9–23)
BUN: 15 mg/dL (ref 6–24)
Bilirubin Total: 0.3 mg/dL (ref 0.0–1.2)
CO2: 24 mmol/L (ref 20–29)
Calcium: 9.8 mg/dL (ref 8.7–10.2)
Chloride: 99 mmol/L (ref 96–106)
Creatinine, Ser: 1.17 mg/dL — ABNORMAL HIGH (ref 0.57–1.00)
GFR calc Af Amer: 63 mL/min/{1.73_m2} (ref >59)
GFR calc non Af Amer: 55 mL/min/{1.73_m2} — ABNORMAL LOW (ref >59)
Globulin, Total: 3.3 g/dL (ref 1.5–4.5)
Glucose: 82 mg/dL (ref 65–99)
Potassium: 4.1 mmol/L (ref 3.5–5.2)
Sodium: 139 mmol/L (ref 134–144)
Total Protein: 7.4 g/dL (ref 6.0–8.5)

## 2019-01-07 LAB — LIPID PANEL
Chol/HDL Ratio: 5.3 ratio — ABNORMAL HIGH (ref 0.0–4.4)
Cholesterol, Total: 249 mg/dL — ABNORMAL HIGH (ref 100–199)
HDL: 47 mg/dL (ref >39)
LDL Chol Calc (NIH): 169 mg/dL — ABNORMAL HIGH (ref 0–99)
Triglycerides: 179 mg/dL — ABNORMAL HIGH (ref 0–149)
VLDL Cholesterol Cal: 33 mg/dL (ref 5–40)

## 2019-01-18 NOTE — Patient Instructions (Addendum)
  HEALTH MAINTENANCE RECOMMENDATIONS:  It is recommended that you get at least 30 minutes of aerobic exercise at least 5 days/week (for weight loss, you may need as much as 60-90 minutes). This can be any activity that gets your heart rate up. This can be divided in 10-15 minute intervals if needed, but try and build up your endurance at least once a week.  Weight bearing exercise is also recommended twice weekly.  Eat a healthy diet with lots of vegetables, fruits and fiber.  "Colorful" foods have a lot of vitamins (ie green vegetables, tomatoes, red peppers, etc).  Limit sweet tea, regular sodas and alcoholic beverages, all of which has a lot of calories and sugar.  Up to 1 alcoholic drink daily may be beneficial for women (unless trying to lose weight, watch sugars).  Drink a lot of water.  Calcium recommendations are 1200-1500 mg daily (1500 mg for postmenopausal women or women without ovaries), and vitamin D 1000 IU daily.  This should be obtained from diet and/or supplements (vitamins), and calcium should not be taken all at once, but in divided doses.  Monthly self breast exams and yearly mammograms for women over the age of 58 is recommended.  Sunscreen of at least SPF 30 should be used on all sun-exposed parts of the skin when outside between the hours of 10 am and 4 pm (not just when at beach or pool, but even with exercise, golf, tennis, and yard work!)  Use a sunscreen that says "broad spectrum" so it covers both UVA and UVB rays, and make sure to reapply every 1-2 hours.  Remember to change the batteries in your smoke detectors when changing your clock times in the spring and fall. Carbon monoxide detectors are recommended for your home.  Use your seat belt every time you are in a car, and please drive safely and not be distracted with cell phones and texting while driving.  Stop the red yeast rice. Continue the omega-3 fish oil 3/day. Start taking coenzyme q10 daily. Let the red  yeast wash out for about a week. Then start taking rosuvastatin (Crestor) 10mg  once daily. Continue your lowfat, low cholesterol diet.  Return in 3 months for fasting labs  Try and use tylenol (arthritis or extra strength) for pain, rather than meloxicam, as this is better for your kidneys.

## 2019-01-18 NOTE — Progress Notes (Signed)
Chief Complaint  Patient presents with  . Annual Exam    nonfasting annual exam no pap. Shoulder and arm x 3 months, esp when she is sleeping. (L is worse than R) Was numb but now gone.    Candace Walker is a 48 y.o. female who presents for a complete physical.  She has the following concerns:  She was last seen here in July for paronychia, treated with doxycycline. A week later, it looked larger.  She canceled f/u appt. She treated it with heated aloe and it eventually completely resolved (grandmother's recommendation).  She was also noted to have reflux at that time, and it was recommended that she take Aciphex daily for 2 weeks. She reports this has completely resolved.  She saw Dr. Erlinda Hong (ortho) 2 weeks ago with complaints of 5 months of shoulder pain bilaterally.  He felt shoulder exam was normal, felt it was trapezius strain and PT was recommended. RF and ANA were negative, ESR was 28, uric acid 6.4. She denies pain in her upper back. Pain is in the front of her shoulders, and the back of the upper arms, achey muscles in her arms.  Pain is only at night, never during the day, and it radiates down the arms.  At first her hands would just get numb.She no longer gets numbness in hands (changes sleep position when this happens).  Denies weakness in her hands. She takes meloxicam before bed, but not every night, which helps. (she thought it was a muscle relaxant, rather than NSAID).  She uses topical meds most nights to her arms, due to pain.  OA R knee, previously injected by Dr. Erlinda Hong. Pain completely resolved. She has ongoing/chronic hip pain. This pain comes and goes. Last time was 2 weeks ago, took meloxicam for 5 days and it resolved.  She saw Dr. Paulla Dolly last in January with flare of L PF and R achilles tendonitis.  He treated her with Medrol Dosepak, said to return for injections if not improving.  Orthotics were recommended (held off due to cost). Occasionally the R achilles flares,  currently doing well.  Hypertension follow-up: Blood pressures elsewhere are fine when checkedat work, running 112-121/65-70's.  Denies dizziness, headaches, chest pain or edema. Denies side effects of medications.Lots of muscle cramps in her arms at night as reported above.  2 weeks ago she had short episode of her heart racing, relieved by cough. No associated dizziness or chest pain (lasts just a few seconds.).  Allergies and asthma: She hasn't taken any singulair since January.  Started some other vitamins, wasn't sure if she needed.  Since COVID, there are no visitors with perfume, and no flowers, so she really hasn't had any problems.  She continues to take [employer], and is doing well.  Hasn't needed to use albuterol in a very long time.  In the past, after exposure to perfume or flowers, she needs to takea 30 minute break,albuterol prn(and avoids going back into that particular room for the rest of the shift). Tries to avoid by switching with somebody when she sees a lot of flowers in the patient's room, before any problems.  Again, not an issue since COVID. Last spirometry was normal in 12/2017.  H/o vitamin D deficiency: She is compliant with daily supplement. Last check was 11/2013, level was 37 on the same vitamin.  Hyperlipidemia: This has been diet controlled, she has never taken cholesterol medication. On last check a year ago, lipids had improved from the  year prior, but still above goal; diet is good, discussed hereditary component.  ASCVD calculator: 3.1% 10 year risk, 39% lifetime risk.   She declined statin, but wanted to try red yeast rice. She was supposed to return for recheck in 3 mos (after taking RYR), but did not. She has been taking 3 fish oil daily, and red yeast rice BID. She had labs drawn prior to visit, see below.  Current diet includes: Egg whites only, no red meat. Not much cheese. Eats a lot of chicken, oatmeal, salad, uses vinaigrette  dressing (sundried tomato). No longer eating much salads.   Immunization History  Administered Date(s) Administered  . Influenza Split 10/15/2010, 11/19/2011, 10/31/2013  . Influenza Whole 11/12/2012  . Influenza-Unspecified 11/05/2014, 11/17/2015, 11/09/2016, 11/16/2017  . Pneumococcal Polysaccharide-23 04/24/2013  . Tdap 04/24/2013   Yearly flu shots at work [employer]) Last Pap smear: per GYN Dr. Corinna Capra, last seen 03/2018 (last in computer is from 01/2017) Last mammogram:at Dr. Gregor Hams office, 03/2018 (last received here was 01/2017) Last colonoscopy: 11/2018.  Tortuous colon, small internal hemorrhoids, no polyps.  Rec repeat 5 years (due to FHx) Last DEXA: never Dentist: twice yearly Ophtho: Yearly Exercise: just stretching, no regular cardio. Pre-COVID routine: gym 3-4 days/week--weights, 35-50 minutes of cardio. Walks on the job.  Past Medical History:  Diagnosis Date  . Allergic rhinitis, cause unspecified   . Allergy   . Arthritis    bursitis in hips, recently dx with avascular necrosis femor head  . Asthma with allergic rhinitis    rarely uses inhaler unless she gets sick  . Essential hypertension, benign   . Heart murmur   . Mixed hyperlipidemia    no meds, diet controlled  . Positive PPD    h/o BCG vaccine, negative chest xray  . SVD (spontaneous vaginal delivery)    x 2  . Unspecified vitamin D deficiency     Past Surgical History:  Procedure Laterality Date  . ENDOMETRIAL ABLATION  2007   Dr. Corinna Capra  . LAPAROSCOPIC ASSISTED VAGINAL HYSTERECTOMY N/A 06/12/2013   Procedure: LAPAROSCOPIC ASSISTED VAGINAL HYSTERECTOMY;  Surgeon: Luz Lex, MD;  Location: [facility];  Service: Gynecology;  Laterality: N/A;  . LAPAROSCOPIC BILATERAL SALPINGECTOMY  06/12/2013   Procedure: LAPAROSCOPIC BILATERAL SALPINGECTOMY;  Surgeon: Luz Lex, MD;  Location: [facility];  Service: Gynecology;;  . TUBAL LIGATION    . WISDOM TOOTH EXTRACTION     right bottom lower tooth     Social History   Socioeconomic History  . Marital status: Divorced    Spouse name: Not on file  . Number of children: 2  . Years of education: Not on file  . Highest education level: Not on file  Occupational History  . Occupation: Research scientist (life sciences): [employer]  . Financial resource strain: Not on file  . Food insecurity    Worry: Not on file    Inability: Not on file  . Transportation needs    Medical: Not on file    Non-medical: Not on file  Tobacco Use  . Smoking status: Never Smoker  . Smokeless tobacco: Never Used  Substance and Sexual Activity  . Alcohol use: Yes    Comment: rare  . Drug use: No  . Sexual activity: Not Currently    Birth control/protection: Surgical    Comment: ablation and tubal ligation and hysterectomy  Lifestyle  . Physical activity    Days per week: Not on file  Minutes per session: Not on file  . Stress: Not on file  Relationships  . Social Herbalist on phone: Not on file    Gets together: Not on file    Attends religious service: Not on file    Active member of club or organization: Not on file    Attends meetings of clubs or organizations: Not on file    Relationship status: Not on file  . Intimate partner violence    Fear of current or ex partner: Not on file    Emotionally abused: Not on file    Physically abused: Not on file    Forced sexual activity: Not on file  Other Topics Concern  . Not on file  Social History Narrative   Lives with her son; no pets. Divorced from her husband.  Daughter is in the army, living in [place].   Parents are staying with her since 12/2016 (from  [place]), father is on treatment for lung cancer (started 12/2016). Doing well.    Family History  Problem Relation Age of Onset  . Diabetes Mother   . Hypertension Mother   . Allergies Mother   . Colon polyps Mother 3  . Kidney disease Mother   . Heart disease Father 78       MI  . Hyperlipidemia  Father   . Hypertension Father   . Lung cancer Father 27       smoker  . Breast cancer Paternal Grandmother   . Rheum arthritis Paternal Grandmother   . Hypertension Sister   . Diabetes Sister        borderline  . Rheum arthritis Maternal Grandmother   . Colon cancer Neg Hx   . Esophageal cancer Neg Hx   . Liver disease Neg Hx   . Stomach cancer Neg Hx   . Rectal cancer Neg Hx     Outpatient Encounter Medications as of 01/19/2019  Medication Sig Note  . Ascorbic Acid (VITAMIN C) 1000 MG tablet Take 1,000 mg by mouth daily.   . cholecalciferol (VITAMIN D) 1000 UNITS tablet Take 1,000 Units by mouth daily. Reported on 07/22/2015   . fexofenadine (ALLEGRA) 180 MG tablet Take 180 mg by mouth at bedtime. Reported on 07/22/2015   . fish oil-omega-3 fatty acids 1000 MG capsule Take 3 g by mouth daily. Reported on 07/22/2015   . lisinopril-hydrochlorothiazide (PRINZIDE,ZESTORETIC) 20-12.5 MG tablet Take 1 tablet by mouth daily.   . Red Yeast Rice Extract (RED YEAST RICE PO) Take 1 tablet by mouth 2 (two) times daily.   . TURMERIC PO Take 1 capsule by mouth daily.   Marland Kitchen acetaminophen (TYLENOL) 500 MG tablet Take 1,000 mg by mouth every 6 (six) hours as needed.   . valACYclovir (VALTREX) 1000 MG tablet Take 2 tablets at onset of cold sore.  Repeat once 12 hours later (4 pills/course) (Patient not taking: Reported on 01/19/2019)   . VENTOLIN HFA 108 (90 Base) MCG/ACT inhaler INHALE 2 PUFFS INTO THE LUNGS EVERY 6 HOURS AS NEEDED FOR WHEEZING (Patient not taking: Reported on 01/19/2019) 09/01/2018: Used Saturday   No facility-administered encounter medications on file as of 01/19/2019.     Allergies  Allergen Reactions  . Nexium [Esomeprazole Magnesium] Hives, Itching and Swelling  . Penicillins Hives  . Percocet [Oxycodone-Acetaminophen] Itching     ROS: The patient denies anorexia, fever, headaches, vision changes, decreased hearing, ear pain, sore throat, breast concerns, chest pain, dizziness,  syncope, dyspnea on exertion, cough,  swelling, nausea, vomiting, diarrhea, constipation, abdominal pain, melena, hematochezia, hematuria, incontinence, dysuria, vaginal bleeding, discharge, odor or itch, genital lesions, numbness, tingling, weakness, tremor, suspicious skin lesions, depression, anxiety, abnormal bleeding or enlarged lymph nodes. Plantar fasciitis and achilles tendonitis have improved, not flaring. No further knee pain (s/p cortisone injection over a year ago) Hip pain off/on-- +bilateral hips. Heat and massage helps, and mobic; currently doing well. Allergies are controlled; breathing is good. Rare short-lived tachycardia, relieved by cough. Shoulder and arm pain per HPI    PHYSICAL EXAM:  BP 110/74   Pulse 80   Temp 98.7 F (37.1 C) (Tympanic)   Ht _0  (1.727 m)   Wt 237 lb 6.4 oz (107.7 kg)   LMP 11/07/2012   BMI 36.10 kg/m   Wt Readings from Last 3 Encounters:  01/19/19 237 lb 6.4 oz (107.7 kg)  11/29/18 236 lb (107 kg)  11/07/18 236 lb (107 kg)     General Appearance:   Alert, cooperative, no distress, appears stated age. She is in good spirits.  Head:   Normocephalic, without obvious abnormality, atraumatic  Eyes:   PERRL, conjunctiva/corneas clear, EOM's intact, fundi benign. Pterygium medially on the right  Ears:   Normal TM's and external ear canals  Nose:  Not examined, wearing mask due to COVID-19 pandemic  Throat:  Not examined, wearing mask due to COVID-19 pandemic  Neck:  Supple, no lymphadenopathy; thyroid: no enlargement/tenderness/nodules; no carotid bruit or JVD. Trapezius muscles nontender, no spasm.  FROM of neck  Back:  Spine nontender, no curvature, ROM normal, no CVAtenderness  Lungs:   Clear to auscultation bilaterally without wheezes, rales orronchi; respirations unlabored  Chest Wall:   No tenderness or deformity  Heart:   Regular rate and rhythm, S1 and S2 normal, no murmur, rub or gallop, no  ectopy  Breast Exam:   Deferred to GYN  Abdomen:   Soft, non-tender, nondistended, normoactive bowel sounds, no masses, no hepatosplenomegaly  Genitalia:   Deferred to GYN     Extremities:  No clubbing, cyanosis or edema. Mildly tender at right trochanteric bursa. She has FROM of shoulder and elbows, with no reproducible tenderness anywhere in upper extremities.  Pulses:  2+ and symmetric all extremities  Skin:  Skin color, texture, turgor normal, no rashes or lesions  Lymph nodes:  Cervical, supraclavicular, and axillary nodes normal  Neurologic:  Normal strength, sensation and gait; reflexes 2+ and symmetric throughout   Psych: Normal mood, affect, hygiene and grooming     Chemistry      Component Value Date/Time   NA 139 01/06/2019 0830   K 4.1 01/06/2019 0830   CL 99 01/06/2019 0830   CO2 24 01/06/2019 0830   BUN 15 01/06/2019 0830   CREATININE 1.17 (H) 01/06/2019 0830   CREATININE 1.00 12/30/2016 1046      Component Value Date/Time   CALCIUM 9.8 01/06/2019 0830   ALKPHOS 65 01/06/2019 0830   AST 22 01/06/2019 0830   ALT 11 01/06/2019 0830   BILITOT 0.3 01/06/2019 0830     Fasting glucose 82  Lab Results  Component Value Date   CHOL 249 (H) 01/06/2019   HDL 47 01/06/2019   LDLCALC 169 (H) 01/06/2019   TRIG 179 (H) 01/06/2019   CHOLHDL 5.3 (H) 01/06/2019   Lab Results  Component Value Date   WBC 3.0 (L) 01/06/2019   HGB 12.7 01/06/2019   HCT 37.7 01/06/2019   MCV 86 01/06/2019   PLT 176 01/06/2019  ASSESSMENT/PLAN  Annual physical exam  Mixed hyperlipidemia - no benefit from RYR (and ?SE); Stop RYR x 1-2 wks, then start Crestor 60m and coenzyme Q10. Cont low cholesterol diet. Recheck 3 mos - Plan: rosuvastatin (CRESTOR) 10 MG tablet, Lipid panel  Essential hypertension, benign - well controlled - Plan: lisinopril-hydrochlorothiazide (ZESTORETIC) 20-12.5 MG tablet  Asthma with allergic rhinitis without  complication, unspecified asthma severity - normal spirometry, even off singulair. To restart if/when having more allergen exposures. Cont albuterol prn - Plan: Spirometry with Graph  Allergic rhinitis, unspecified seasonality, unspecified trigger - controlled  Medication monitoring encounter - Plan: Lipid panel, Comprehensive metabolic panel  Myalgia - upper extremities; normal labs and eval by ortho and me.  ? if could be related to red yeast rice (which is being stopped). Limit NSAIDs, try tylenol  Obesity (BMI 30-39.9) - counseled re: diet, exercise   Recheck 3 mos c-met, lipids  Will get records from 03/2018 visit to Dr. LCorinna Capra  Discussed monthly self breast exams and yearly mammograms; at least 30 minutes of aerobic activity at least 5 days/week, weight-bearing exercise at least 2x/week; proper sunscreen use reviewed; healthy diet, including goals of calcium and vitamin D intake and alcohol recommendations (less than or equal to 1 drink/day) reviewed; regular seatbelt use; changing batteries in smoke detectors. Immunization recommendations discussed--UTD. Continue yearly flu shots. Colonoscopy screening, UTD.   F/u 3 mos fasting labs, 1 year CPE, sooner prn.

## 2019-01-19 ENCOUNTER — Ambulatory Visit (INDEPENDENT_AMBULATORY_CARE_PROVIDER_SITE_OTHER): Payer: 59 | Admitting: Family Medicine

## 2019-01-19 ENCOUNTER — Encounter: Payer: Self-pay | Admitting: Family Medicine

## 2019-01-19 ENCOUNTER — Other Ambulatory Visit: Payer: Self-pay

## 2019-01-19 VITALS — BP 110/74 | HR 80 | Temp 98.7°F | Ht 68.0 in | Wt 237.4 lb

## 2019-01-19 DIAGNOSIS — M791 Myalgia, unspecified site: Secondary | ICD-10-CM | POA: Diagnosis not present

## 2019-01-19 DIAGNOSIS — J309 Allergic rhinitis, unspecified: Secondary | ICD-10-CM

## 2019-01-19 DIAGNOSIS — Z5181 Encounter for therapeutic drug level monitoring: Secondary | ICD-10-CM | POA: Diagnosis not present

## 2019-01-19 DIAGNOSIS — J45909 Unspecified asthma, uncomplicated: Secondary | ICD-10-CM | POA: Diagnosis not present

## 2019-01-19 DIAGNOSIS — I10 Essential (primary) hypertension: Secondary | ICD-10-CM

## 2019-01-19 DIAGNOSIS — E782 Mixed hyperlipidemia: Secondary | ICD-10-CM

## 2019-01-19 DIAGNOSIS — E669 Obesity, unspecified: Secondary | ICD-10-CM

## 2019-01-19 DIAGNOSIS — Z0000 Encounter for general adult medical examination without abnormal findings: Secondary | ICD-10-CM | POA: Diagnosis not present

## 2019-01-19 MED ORDER — ROSUVASTATIN CALCIUM 10 MG PO TABS: 10.0000 mg | ORAL_TABLET | Freq: Every day | ORAL | 2 refills | Status: DC

## 2019-01-19 MED ORDER — LISINOPRIL-HYDROCHLOROTHIAZIDE 20-12.5 MG PO TABS: 1.0000 | ORAL_TABLET | Freq: Every day | ORAL | 3 refills | Status: DC

## 2019-01-19 MED FILL — ROSUVASTATIN CALCIUM 10 MG: 10 | 30 days supply | Qty: 30 | Fill #0

## 2019-01-19 MED FILL — LISINOPRIL-HCTZ 20-12.5 MG: 20-12.5 | 90 days supply | Qty: 90 | Fill #0

## 2019-03-02 ENCOUNTER — Encounter: Payer: Self-pay | Admitting: *Deleted

## 2019-03-16 MED FILL — CLINDAMYCIN HCL 150 MG CAPS: 150 | 7 days supply | Qty: 21 | Fill #0

## 2019-03-23 ENCOUNTER — Telehealth: Payer: Self-pay

## 2019-03-23 DIAGNOSIS — E782 Mixed hyperlipidemia: Secondary | ICD-10-CM

## 2019-03-23 MED ORDER — ROSUVASTATIN CALCIUM 10 MG PO TABS: 10.0000 mg | ORAL_TABLET | Freq: Every day | ORAL | 0 refills | Status: DC

## 2019-03-23 MED FILL — CLINDAMYCIN HCL 150 MG CAPS: 150 | 7 days supply | Qty: 21 | Fill #1

## 2019-03-23 MED FILL — ROSUVASTATIN CALCIUM 10 MG: 10 | 30 days supply | Qty: 30 | Fill #1

## 2019-03-23 NOTE — Telephone Encounter (Signed)
Received a fax from [facility] for a refill on the pts. Rosuvastatin pt. Last apt. Was 01/19/19.

## 2019-03-23 NOTE — Telephone Encounter (Signed)
Done

## 2019-03-27 ENCOUNTER — Telehealth: Payer: Self-pay | Admitting: *Deleted

## 2019-03-27 NOTE — Telephone Encounter (Signed)
Spoke with patient and she scheduled a virtual for next Monday 04/03/19.

## 2019-03-27 NOTE — Telephone Encounter (Signed)
Advise pt--this is an NSAID, similar to the ibuprofen we used to prescribe. It shouldn't be used long-term/chronically unless absolutely needed (GI risks, and kidney/liver risks, which require monitoring).  If she wants rx, does need to have virtual visit, and I'll then have to figure out if/when labs are needed (I didn't look at labs in chart in replying to this message).

## 2019-03-27 NOTE — Telephone Encounter (Signed)
Patient called and said she took diclofenac 75mg  that she had from Dr. Paulla Dolly (it was an old rx, she had 3 left). She said she had THE best sleep she has ever had this weekend. Completely took away the aching arm muscle pain she has been having for so long. She was wondering if this was something that you could prescribe for her? She is willing to do virtual or in office visit if needed.

## 2019-04-03 ENCOUNTER — Institutional Professional Consult (permissible substitution): Payer: Self-pay | Admitting: Family Medicine

## 2019-04-19 ENCOUNTER — Other Ambulatory Visit: Payer: 59

## 2019-04-24 ENCOUNTER — Other Ambulatory Visit: Payer: 59

## 2019-04-24 ENCOUNTER — Other Ambulatory Visit: Payer: Self-pay

## 2019-04-24 DIAGNOSIS — Z5181 Encounter for therapeutic drug level monitoring: Secondary | ICD-10-CM

## 2019-04-24 DIAGNOSIS — E782 Mixed hyperlipidemia: Secondary | ICD-10-CM | POA: Diagnosis not present

## 2019-04-24 MED FILL — LISINOPRIL-HCTZ 20-12.5 MG: 20-12.5 | 90 days supply | Qty: 90 | Fill #1

## 2019-04-25 LAB — COMPREHENSIVE METABOLIC PANEL
ALT: 17 IU/L (ref 0–32)
AST: 23 IU/L (ref 0–40)
Albumin/Globulin Ratio: 1.2 (ref 1.2–2.2)
Albumin: 4.1 g/dL (ref 3.8–4.8)
Alkaline Phosphatase: 70 IU/L (ref 39–117)
BUN/Creatinine Ratio: 15 (ref 9–23)
BUN: 14 mg/dL (ref 6–24)
Bilirubin Total: 0.3 mg/dL (ref 0.0–1.2)
CO2: 22 mmol/L (ref 20–29)
Calcium: 9.5 mg/dL (ref 8.7–10.2)
Chloride: 102 mmol/L (ref 96–106)
Creatinine, Ser: 0.96 mg/dL (ref 0.57–1.00)
GFR calc Af Amer: 80 mL/min/{1.73_m2} (ref >59)
GFR calc non Af Amer: 70 mL/min/{1.73_m2} (ref >59)
Globulin, Total: 3.3 g/dL (ref 1.5–4.5)
Glucose: 88 mg/dL (ref 65–99)
Potassium: 4 mmol/L (ref 3.5–5.2)
Sodium: 141 mmol/L (ref 134–144)
Total Protein: 7.4 g/dL (ref 6.0–8.5)

## 2019-04-25 LAB — LIPID PANEL
Chol/HDL Ratio: 3.6 ratio (ref 0.0–4.4)
Cholesterol, Total: 174 mg/dL (ref 100–199)
HDL: 49 mg/dL (ref >39)
LDL Chol Calc (NIH): 101 mg/dL — ABNORMAL HIGH (ref 0–99)
Triglycerides: 138 mg/dL (ref 0–149)
VLDL Cholesterol Cal: 24 mg/dL (ref 5–40)

## 2019-04-25 MED FILL — ROSUVASTATIN CALCIUM 10 MG: 10 | 90 days supply | Qty: 90 | Fill #0

## 2019-06-05 DIAGNOSIS — H5203 Hypermetropia, bilateral: Secondary | ICD-10-CM | POA: Diagnosis not present

## 2019-06-05 DIAGNOSIS — H524 Presbyopia: Secondary | ICD-10-CM | POA: Diagnosis not present

## 2019-06-05 DIAGNOSIS — H52221 Regular astigmatism, right eye: Secondary | ICD-10-CM | POA: Diagnosis not present

## 2019-06-06 DIAGNOSIS — Z1231 Encounter for screening mammogram for malignant neoplasm of breast: Secondary | ICD-10-CM | POA: Diagnosis not present

## 2019-06-06 LAB — HM MAMMOGRAPHY

## 2019-07-18 MED FILL — LISINOPRIL-HCTZ 20-12.5 MG: 20-12.5 | 90 days supply | Qty: 90 | Fill #2

## 2019-07-18 MED FILL — ROSUVASTATIN CALCIUM 10 MG: 10 | 30 days supply | Qty: 30 | Fill #2

## 2019-08-24 ENCOUNTER — Encounter: Payer: Self-pay | Admitting: Family Medicine

## 2019-08-24 ENCOUNTER — Ambulatory Visit: Payer: 59 | Admitting: Family Medicine

## 2019-08-24 ENCOUNTER — Other Ambulatory Visit: Payer: Self-pay

## 2019-08-24 VITALS — BP 120/74 | HR 68 | Ht 69.0 in | Wt 244.8 lb

## 2019-08-24 DIAGNOSIS — I10 Essential (primary) hypertension: Secondary | ICD-10-CM

## 2019-08-24 DIAGNOSIS — Z5181 Encounter for therapeutic drug level monitoring: Secondary | ICD-10-CM | POA: Diagnosis not present

## 2019-08-24 DIAGNOSIS — R002 Palpitations: Secondary | ICD-10-CM | POA: Diagnosis not present

## 2019-08-24 DIAGNOSIS — L723 Sebaceous cyst: Secondary | ICD-10-CM | POA: Diagnosis not present

## 2019-08-24 MED ORDER — SULFAMETHOXAZOLE-TRIMETHOPRIM 800-160 MG PO TABS: 1.0000 | ORAL_TABLET | Freq: Two times a day (BID) | ORAL | 0 refills | Status: DC

## 2019-08-24 MED FILL — SULFAMETHOXAZOLE-TMP DS TAB: 800-160 | 10 days supply | Qty: 20 | Fill #0

## 2019-08-24 NOTE — Progress Notes (Signed)
Chief Complaint  Patient presents with  . Consult    heart racing for over a month. Feels like she can't do anything, feels like she has to cough. Feels her heart beating hard and it blocks off her airway.   . Recurrent Skin Infections    boil in between legs that is a hard piece of flesh now that she would like you to look at.  . Immunizations    had both COVID vaccines and will call me later with dates.    She notices her heart racing with strenuous activity--like lifting a suitcase up to put on the bed, walking back and forth to pack/unpack.  She has had it occur while walking a lot a work. Starts suddenly, but pulse gradually improves, rests, takes deep breaths. This is happening about every 2 weeks, once happened 3x in a week. Lasts 5-10 minutes. Doesn't ever occur at the gym, where she does elliptical, and upper body weights. This last occurred yesterday, and also occurred Saturday of this week.  Denies dehydration, increased caffeine or any other triggers.  She had 2 episodes in 2019 where she had racing heart. Had EKG done at 12/2017 visit. Had a heart monitor at one point over 10 years ago  Her second concern is: She noted a bump 10 days ago at left groin area.  It is painful.  She tried heating, tried squeezing it, unable to get anything out. No significant increase in size since she first noted last week. Denies it being tender to touch.  She noted more discomfort when she had the heating pad on it, and noticed small "open areas" after removing the heat.  No drainage.  PMH, PSH, SH reviewed  Outpatient Encounter Medications as of 08/24/2019  Medication Sig Note  . Ascorbic Acid (VITAMIN C) 1000 MG tablet Take 1,000 mg by mouth daily.   . cholecalciferol (VITAMIN D) 1000 UNITS tablet Take 1,000 Units by mouth daily. Reported on 07/22/2015   . fexofenadine (ALLEGRA) 180 MG tablet Take 180 mg by mouth at bedtime. Reported on 07/22/2015   . fish oil-omega-3 fatty acids 1000 MG  capsule Take 3 g by mouth daily. Reported on 07/22/2015   . lisinopril-hydrochlorothiazide (ZESTORETIC) 20-12.5 MG tablet Take 1 tablet by mouth daily.   . rosuvastatin (CRESTOR) 10 MG tablet Take 1 tablet (10 mg total) by mouth daily.   . vitamin B-12 (CYANOCOBALAMIN) 1000 MCG tablet Take 1,000 mcg by mouth daily.   Marland Kitchen acetaminophen (TYLENOL) 500 MG tablet Take 1,000 mg by mouth every 6 (six) hours as needed. (Patient not taking: Reported on 08/24/2019)   . valACYclovir (VALTREX) 1000 MG tablet Take 2 tablets at onset of cold sore.  Repeat once 12 hours later (4 pills/course) (Patient not taking: Reported on 01/19/2019)   . VENTOLIN HFA 108 (90 Base) MCG/ACT inhaler INHALE 2 PUFFS INTO THE LUNGS EVERY 6 HOURS AS NEEDED FOR WHEEZING (Patient not taking: Reported on 08/24/2019) 09/01/2018: Used Saturday  . [DISCONTINUED] TURMERIC PO Take 1 capsule by mouth daily.    No facility-administered encounter medications on file as of 08/24/2019.   Allergies  Allergen Reactions  . Nexium [Esomeprazole Magnesium] Hives, Itching and Swelling  . Penicillins Hives  . Percocet [Oxycodone-Acetaminophen] Itching   ROS: No fever, chills, URI symptoms, chest pain, shortness of breath. Heart racing per HPI. No edema, muscle cramps, headaches, dizziness. No bleeding, bruising, rashes (other than lump, per HPI). Denies urinary complaints, nausea, vomiting, diarrhea. See HPI  PMH, PSH, SH reviewed  BP 120/74   Pulse 68   Ht 5\' 9"  (1.753 m)   Wt 244 lb 12.8 oz (111 kg)   LMP 11/07/2012   BMI 36.15 kg/m   Well-appearing, pleasant female, in good spirits, in no distress HEENT: conjunctiva and sclera are clear, EOMI, wearing mask Neck: no lymphadenopathy, thyromegaly or carotid bruit Heart: regular rate and rhythm, no murmur, rub or ectopy Lungs: clear bilaterally Back: no spinal or CVA tenderness Abdomen: soft, nontender, no mass Extremities: no edema, normal pulses Skin: normal turgor. Suprapubic area, just  medial to the L groin-- There is a 2 x 1 cm area of induration. The hair is shaved in this area. There are multiple open pores/erythematous spots visible. No fluctuance   ASSESSMENT/PLAN:  Palpitations - Check labs; EKG unremarkable.  Needs further eval (echo, monitor), referring to cardiology - Plan: TSH, CBC with Differential/Platelet, Comprehensive metabolic panel, EKG 03-TWSF, Ambulatory referral to Cardiology  Essential hypertension, benign - controlled - Plan: EKG 12-Lead  Medication monitoring encounter  Sebaceous cyst - L groin, vs ingrown hair.  Treat with Septra and warm compresses.  f/u if worsening - Plan: sulfamethoxazole-trimethoprim (BACTRIM DS) 800-160 MG tablet   Sebaceous cyst vs ingrown hair, inflamed. Bactrim DS BID x 10 day Moist heat   TSH, CBC, chem EKG Refer cardiology--

## 2019-08-24 NOTE — Patient Instructions (Signed)
Stay well hydrated. We are checking labs and referring you to cardiology for further evaluation. Your EKG looked fine.  Take the antibiotic for the cyst/abscess. Continue to use warm compresses.  Return for recheck if increasing in size, pain, fever

## 2019-08-25 LAB — COMPREHENSIVE METABOLIC PANEL
ALT: 18 IU/L (ref 0–32)
AST: 25 IU/L (ref 0–40)
Albumin/Globulin Ratio: 1.3 (ref 1.2–2.2)
Albumin: 4.4 g/dL (ref 3.8–4.8)
Alkaline Phosphatase: 67 IU/L (ref 48–121)
BUN/Creatinine Ratio: 18 (ref 9–23)
BUN: 18 mg/dL (ref 6–24)
Bilirubin Total: <0.2 mg/dL (ref 0.0–1.2)
CO2: 25 mmol/L (ref 20–29)
Calcium: 9.6 mg/dL (ref 8.7–10.2)
Chloride: 104 mmol/L (ref 96–106)
Creatinine, Ser: 1.02 mg/dL — ABNORMAL HIGH (ref 0.57–1.00)
GFR calc Af Amer: 75 mL/min/{1.73_m2} (ref >59)
GFR calc non Af Amer: 65 mL/min/{1.73_m2} (ref >59)
Globulin, Total: 3.3 g/dL (ref 1.5–4.5)
Glucose: 89 mg/dL (ref 65–99)
Potassium: 4.1 mmol/L (ref 3.5–5.2)
Sodium: 142 mmol/L (ref 134–144)
Total Protein: 7.7 g/dL (ref 6.0–8.5)

## 2019-08-25 LAB — CBC WITH DIFFERENTIAL/PLATELET
Basophils Absolute: 0 10*3/uL (ref 0.0–0.2)
Basos: 0 %
EOS (ABSOLUTE): 0 10*3/uL (ref 0.0–0.4)
Eos: 1 %
Hematocrit: 39.7 % (ref 34.0–46.6)
Hemoglobin: 13.1 g/dL (ref 11.1–15.9)
Immature Grans (Abs): 0 10*3/uL (ref 0.0–0.1)
Immature Granulocytes: 0 %
Lymphocytes Absolute: 1.8 10*3/uL (ref 0.7–3.1)
Lymphs: 44 %
MCH: 28.9 pg (ref 26.6–33.0)
MCHC: 33 g/dL (ref 31.5–35.7)
MCV: 87 fL (ref 79–97)
Monocytes Absolute: 0.3 10*3/uL (ref 0.1–0.9)
Monocytes: 8 %
Neutrophils Absolute: 1.9 10*3/uL (ref 1.4–7.0)
Neutrophils: 47 %
Platelets: 166 10*3/uL (ref 150–450)
RBC: 4.54 x10E6/uL (ref 3.77–5.28)
RDW: 13.2 % (ref 11.7–15.4)
WBC: 4.1 10*3/uL (ref 3.4–10.8)

## 2019-08-25 LAB — TSH: TSH: 0.728 u[IU]/mL (ref 0.450–4.500)

## 2019-09-07 ENCOUNTER — Encounter: Payer: Self-pay | Admitting: Family Medicine

## 2019-09-12 NOTE — Progress Notes (Signed)
Cardiology Office Note:   Date:  09/14/2019  NAME:  Candace Walker    MRN: [id number] DOB:  1970/03/10   PCP:  Rita Ohara, MD  Cardiologist:  No primary care provider on file.   Referring MD: Rita Ohara, MD   Chief Complaint  Patient presents with  . Palpitations   History of Present Illness:   Candace Walker is a 49 y.o. female with a hx of HTN who is being seen today for the evaluation of palpitations at the request of Rita Ohara, MD. Recent TSH 0.72. She presents for the evaluation of palpitations. She is had these for at least 2 years. She reports every 1 to 2 weeks she can get rapid sensation of heart beating fast. She reports that she feels her heart will beat out of her chest. This sensation last 5 to 10 minutes. It happened several weeks ago at work. 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 She apparently had a pulse checked by another nurse who noted her heart rate to be around 115. They do not obtain an EKG. The nurse also reported to her that her heart was skipping a beat. She describes no increased caffeine consumption. No recent stress in her life. She reports she does not exercise routinely but has no limitations such as chest pain or shortness of breath. Her EKG today is normal. She reports her father had bypass surgery. She has never had a heart attack or stroke. Her blood pressure is well controlled on lisinopril-HCTZ. She also takes Crestor for hyperlipidemia. Her most recent lipid profile shows a total cholesterol 174, HDL 49, LDL 101, triglycerides 138. She is not diabetic. No alcohol consumption reported. She is a never smoker. No drug use.  Past Medical History: Past Medical History:  Diagnosis Date  . Allergic rhinitis, cause unspecified   . Allergy   . Arthritis    bursitis in hips, recently dx with avascular necrosis femor head  . Asthma with allergic rhinitis    rarely uses inhaler unless she gets sick  . Essential  hypertension, benign   . Heart murmur   . Mixed hyperlipidemia    no meds, diet controlled  . Positive PPD    h/o BCG vaccine, negative chest xray  . SVD (spontaneous vaginal delivery)    x 2  . Unspecified vitamin D deficiency     Past Surgical History: Past Surgical History:  Procedure Laterality Date  . ENDOMETRIAL ABLATION  2007   Dr. Corinna Capra  . LAPAROSCOPIC ASSISTED VAGINAL HYSTERECTOMY N/A 06/12/2013   Procedure: LAPAROSCOPIC ASSISTED VAGINAL HYSTERECTOMY;  Surgeon: Luz Lex, MD;  Location: [facility];  Service: Gynecology;  Laterality: N/A;  . LAPAROSCOPIC BILATERAL SALPINGECTOMY  06/12/2013   Procedure: LAPAROSCOPIC BILATERAL SALPINGECTOMY;  Surgeon: Luz Lex, MD;  Location: [facility];  Service: Gynecology;;  . TUBAL LIGATION    . WISDOM TOOTH EXTRACTION     right bottom lower tooth    Current Medications: Current Meds  Medication Sig  . acetaminophen (TYLENOL) 500 MG tablet Take 1,000 mg by mouth every 6 (six) hours as needed.   . Ascorbic Acid (VITAMIN C) 1000 MG tablet Take 1,000 mg by mouth daily.  . cholecalciferol (VITAMIN D) 1000 UNITS tablet Take 1,000 Units by mouth daily. Reported on 07/22/2015  . fexofenadine (ALLEGRA) 180 MG tablet Take 180 mg by mouth at bedtime. Reported on 07/22/2015  . fish oil-omega-3 fatty acids 1000 MG capsule Take 3  g by mouth daily. Reported on 07/22/2015  . lisinopril-hydrochlorothiazide (ZESTORETIC) 20-12.5 MG tablet Take 1 tablet by mouth daily.  . rosuvastatin (CRESTOR) 10 MG tablet Take 1 tablet (10 mg total) by mouth daily.  Marland Kitchen sulfamethoxazole-trimethoprim (BACTRIM DS) 800-160 MG tablet Take 1 tablet by mouth 2 (two) times daily.  . valACYclovir (VALTREX) 1000 MG tablet Take 2 tablets at onset of cold sore.  Repeat once 12 hours later (4 pills/course)  . VENTOLIN HFA 108 (90 Base) MCG/ACT inhaler INHALE 2 PUFFS INTO THE LUNGS EVERY 6 HOURS AS NEEDED FOR WHEEZING  . vitamin B-12 (CYANOCOBALAMIN) 1000 MCG tablet Take 1,000 mcg by mouth  daily.     Allergies:    Nexium [esomeprazole magnesium], Penicillins, and Percocet [oxycodone-acetaminophen]   Social History: Social History   Socioeconomic History  . Marital status: Divorced    Spouse name: Not on file  . Number of children: 2  . Years of education: Not on file  . Highest education level: Not on file  Occupational History  . Occupation: Research scientist (life sciences): [employer]  Tobacco Use  . Smoking status: Never Smoker  . Smokeless tobacco: Never Used  Vaping Use  . Vaping Use: Never used  Substance and Sexual Activity  . Alcohol use: Yes    Comment: rare  . Drug use: No  . Sexual activity: Not Currently    Birth control/protection: Surgical    Comment: ablation and tubal ligation and hysterectomy  Other Topics Concern  . Not on file  Social History Narrative   Lives with her son; no pets. Divorced from her husband.  Daughter is in the army, living in [place].   Parents are staying with her since 12/2016 (from  [place]), father is on treatment for lung cancer (started 12/2016). Doing well.   Social Determinants of Health   Financial Resource Strain:   . Difficulty of Paying Living Expenses:   Food Insecurity:   . Worried About Charity fundraiser in the Last Year:   . Arboriculturist in the Last Year:   Transportation Needs:   . Film/video editor (Medical):   Marland Kitchen Lack of Transportation (Non-Medical):   Physical Activity:   . Days of Exercise per Week:   . Minutes of Exercise per Session:   Stress:   . Feeling of Stress :   Social Connections:   . Frequency of Communication with Friends and Family:   . Frequency of Social Gatherings with Friends and Family:   . Attends Religious Services:   . Active Member of Clubs or Organizations:   . Attends Archivist Meetings:   Marland Kitchen Marital Status:      Family History: The patient's family history includes Allergies in her mother; Breast cancer in her paternal grandmother; Colon  polyps (age of onset: 61) in her mother; Diabetes in her mother and sister; Heart disease (age of onset: 24) in her father; Hyperlipidemia in her father; Hypertension in her father, mother, and sister; Kidney disease in her mother; Lung cancer (age of onset: 106) in her father; Rheum arthritis in her maternal grandmother and paternal grandmother. There is no history of Colon cancer, Esophageal cancer, Liver disease, Stomach cancer, or Rectal cancer.  ROS:   All other ROS reviewed and negative. Pertinent positives noted in the HPI.     EKGs/Labs/Other Studies Reviewed:   The following studies were personally reviewed by me today:  EKG:  EKG is ordered today.  The ekg  ordered today demonstrates normal sinus rhythm, heart rate 71, no acute ST-T changes, no evidence of prior infarct, and was personally reviewed by me.   Recent Labs: 08/24/2019: ALT 18; BUN 18; Creatinine, Ser 1.02; Hemoglobin 13.1; Platelets 166; Potassium 4.1; Sodium 142; TSH 0.728   Recent Lipid Panel    Component Value Date/Time   CHOL 174 04/24/2019 0929   TRIG 138 04/24/2019 0929   HDL 49 04/24/2019 0929   CHOLHDL 3.6 04/24/2019 0929   CHOLHDL 5.1 (H) 12/30/2016 1046   VLDL 29 01/02/2016 0943   LDLCALC 101 (H) 04/24/2019 0929   LDLCALC 182 (H) 12/30/2016 1046    Physical Exam:   VS:  BP 118/78   Pulse 71   Ht 5\' 9"  (1.753 m)   Wt (!) 246 lb (111.6 kg)   LMP 11/07/2012   BMI 36.33 kg/m    Wt Readings from Last 3 Encounters:  09/14/19 (!) 246 lb (111.6 kg)  08/24/19 244 lb 12.8 oz (111 kg)  01/19/19 237 lb 6.4 oz (107.7 kg)    General: Well nourished, well developed, in no acute distress Heart: Atraumatic, normal size  Eyes: PEERLA, EOMI  Neck: Supple, no JVD Endocrine: No thryomegaly Cardiac: Normal S1, S2; RRR; no murmurs, rubs, or gallops Lungs: Clear to auscultation bilaterally, no wheezing, rhonchi or rales  Abd: Soft, nontender, no hepatomegaly  Ext: No edema, pulses 2+ Musculoskeletal: No  deformities, BUE and BLE strength normal and equal Skin: Warm and dry, no rashes   Neuro: Alert and oriented to person, place, time, and situation, CNII-XII grossly intact, no focal deficits  Psych: Normal mood and affect   ASSESSMENT:   Candace Walker is a 49 y.o. female who presents for the following: 1. Palpitations   2. Essential hypertension   3. Murmur     PLAN:   1. Palpitations -Intermittent episodes. Occur 1-2 times per week. EKG today is normal. Thyroid studies normal. She is not anemic. No increased caffeine consumption. We will proceed with a 14-day Zio patch. I also want her to get an echocardiogram given her murmur. See below.  2. Essential hypertension -Well-controlled on current medications.  3. Murmur -She has a midsystolic click. Suggestive of mitral valve prolapse. No significant systolic murmur to suggest regurgitation. We will obtain an echocardiogram.   Disposition: Return in about 3 months (around 12/15/2019).  Medication Adjustments/Labs and Tests Ordered: Current medicines are reviewed at length with the patient today.  Concerns regarding medicines are outlined above.  Orders Placed This Encounter  Procedures  . LONG TERM MONITOR (3-14 DAYS)  . EKG 12-Lead  . ECHOCARDIOGRAM COMPLETE   No orders of the defined types were placed in this encounter.   Patient Instructions  Medication Instructions:  The current medical regimen is effective;  continue present plan and medications.  *If you need a refill on your cardiac medications before your next appointment, please call your pharmacy*   Testing/Procedures: Echocardiogram - Your physician has requested that you have an echocardiogram. Echocardiography is a painless test that uses sound waves to create images of your heart. It provides your doctor with information about the size and shape of your heart and how well your heart's chambers and valves are working. This procedure takes approximately one  hour. There are no restrictions for this procedure. This will be performed at our [facility] location - [address].  Your physician has recommended that you wear a 14 DAY ZIO-PATCH monitor. The Zio patch cardiac  monitor continuously records heart rhythm data for up to 14 days, this is for patients being evaluated for multiple types heart rhythms. For the first 24 hours post application, please avoid getting the Zio monitor wet in the shower or by excessive sweating during exercise. After that, feel free to carry on with regular activities. Keep soaps and lotions away from the ZIO XT Patch.  This will be mailed to you, please expect 7-10 days to receive.    Applying the monitor   Shave hair from upper left chest.   Hold abrader disc by orange tab.  Rub abrader in 40 strokes over left upper chest as indicated in your monitor instructions.   Clean area with 4 enclosed alcohol pads .  Use all pads to assure are is cleaned thoroughly.  Let dry.   Apply patch as indicated in monitor instructions.  Patch will be place under collarbone on left side of chest with arrow pointing upward.   Rub patch adhesive wings for 2 minutes.Remove white label marked "1".  Remove white label marked "2".  Rub patch adhesive wings for 2 additional minutes.   While looking in a mirror, press and release button in center of patch.  A small green light will flash 3-4 times .  This will be your only indicator the monitor has been turned on.     Do not shower for the first 24 hours.  You may shower after the first 24 hours.   Press button if you feel a symptom. You will hear a small click.  Record Date, Time and Symptom in the Patient Log Book.   When you are ready to remove patch, follow instructions on last 2 pages of Patient Log Book.  Stick patch monitor onto last page of Patient Log Book.   Place Patient Log Book in [place] box.  Use locking tab on box and tape box closed securely.  The Orange and [employer] has [employer] on it.  Please place in mailbox as soon as possible.  Your physician should have your test results approximately 7 days after the monitor has been mailed back to [facility].   Call [place] at [phone] if you have questions regarding your ZIO XT patch monitor.  Call them immediately if you see an orange light blinking on your monitor.   If your monitor falls off in less than 4 days contact our Monitor department at [phone].  If your monitor becomes loose or falls off after 4 days call [employer] at [phone] for suggestions on securing your monitor     Follow-Up: At [facility], you and your health needs are our priority.  As part of our continuing mission to provide you with exceptional heart care, we have created designated Provider Care Teams.  These Care Teams include your primary Cardiologist (physician) and Advanced Practice Providers (APPs -  Physician Assistants and Nurse Practitioners) who all work together to provide you with the care you need, when you need it.  We recommend signing up for the patient portal called "MyChart".  Sign up information is provided on this After Visit Summary.  MyChart is used to connect with patients for Virtual Visits (Telemedicine).  Patients are able to view lab/test results, encounter notes, upcoming appointments, etc.  Non-urgent messages can be sent to your provider as well.   To learn more about what you can do with MyChart, go to [email or web address].    Your next appointment:   3 month(s)  The  format for your next appointment:   In Person  Provider:   Eleonore Chiquito, MD        Signed, Addison Naegeli. Audie Box, [place]  [address], [place], [place] [address] [phone]  09/14/2019 4:01 PM

## 2019-09-14 ENCOUNTER — Ambulatory Visit (INDEPENDENT_AMBULATORY_CARE_PROVIDER_SITE_OTHER): Payer: 59 | Admitting: Cardiovascular Disease

## 2019-09-14 ENCOUNTER — Encounter: Payer: Self-pay | Admitting: Cardiovascular Disease

## 2019-09-14 ENCOUNTER — Telehealth: Payer: Self-pay | Admitting: Radiology

## 2019-09-14 ENCOUNTER — Other Ambulatory Visit: Payer: Self-pay

## 2019-09-14 VITALS — BP 118/78 | HR 71 | Ht 69.0 in | Wt 246.0 lb

## 2019-09-14 DIAGNOSIS — I10 Essential (primary) hypertension: Secondary | ICD-10-CM | POA: Diagnosis not present

## 2019-09-14 DIAGNOSIS — R002 Palpitations: Secondary | ICD-10-CM | POA: Diagnosis not present

## 2019-09-14 DIAGNOSIS — R011 Cardiac murmur, unspecified: Secondary | ICD-10-CM

## 2019-09-14 NOTE — Telephone Encounter (Signed)
Enrolled patient for a 14 day Zio monitor to be mailed to patients home.  

## 2019-09-14 NOTE — Patient Instructions (Signed)
Medication Instructions:  The current medical regimen is effective;  continue present plan and medications.  *If you need a refill on your cardiac medications before your next appointment, please call your pharmacy*   Testing/Procedures: Echocardiogram - Your physician has requested that you have an echocardiogram. Echocardiography is a painless test that uses sound waves to create images of your heart. It provides your doctor with information about the size and shape of your heart and how well your heart's chambers and valves are working. This procedure takes approximately one hour. There are no restrictions for this procedure. This will be performed at our [facility] location - [address], [address].  Your physician has recommended that you wear a 14 DAY ZIO-PATCH monitor. The Zio patch cardiac monitor continuously records heart rhythm data for up to 14 days, this is for patients being evaluated for multiple types heart rhythms. For the first 24 hours post application, please avoid getting the Zio monitor wet in the shower or by excessive sweating during exercise. After that, feel free to carry on with regular activities. Keep soaps and lotions away from the ZIO XT Patch.  This will be mailed to you, please expect 7-10 days to receive.    Applying the monitor   Shave hair from upper left chest.   Hold abrader disc by orange tab.  Rub abrader in 40 strokes over left upper chest as indicated in your monitor instructions.   Clean area with 4 enclosed alcohol pads .  Use all pads to assure are is cleaned thoroughly.  Let dry.   Apply patch as indicated in monitor instructions.  Patch will be place under collarbone on left side of chest with arrow pointing upward.   Rub patch adhesive wings for 2 minutes.Remove white label marked "1".  Remove white label marked "2".  Rub patch adhesive wings for 2 additional minutes.   While looking in a mirror, press and release button in center of patch.   A small green light will flash 3-4 times .  This will be your only indicator the monitor has been turned on.     Do not shower for the first 24 hours.  You may shower after the first 24 hours.   Press button if you feel a symptom. You will hear a small click.  Record Date, Time and Symptom in the Patient Log Book.   When you are ready to remove patch, follow instructions on last 2 pages of Patient Log Book.  Stick patch monitor onto last page of Patient Log Book.   Place Patient Log Book in [place] box.  Use locking tab on box and tape box closed securely.  The Orange and [employer] has [employer] on it.  Please place in mailbox as soon as possible.  Your physician should have your test results approximately 7 days after the monitor has been mailed back to [facility].   Call [place] at [phone] if you have questions regarding your ZIO XT patch monitor.  Call them immediately if you see an orange light blinking on your monitor.   If your monitor falls off in less than 4 days contact our Monitor department at [phone].  If your monitor becomes loose or falls off after 4 days call [employer] at [phone] for suggestions on securing your monitor     Follow-Up: At [facility], you and your health needs are our priority.  As part of our continuing mission to provide you with  exceptional heart care, we have created designated Provider Care Teams.  These Care Teams include your primary Cardiologist (physician) and Advanced Practice Providers (APPs -  Physician Assistants and Nurse Practitioners) who all work together to provide you with the care you need, when you need it.  We recommend signing up for the patient portal called "MyChart".  Sign up information is provided on this After Visit Summary.  MyChart is used to connect with patients for Virtual Visits (Telemedicine).  Patients are able to view lab/test results, encounter notes, upcoming appointments, etc.   Non-urgent messages can be sent to your provider as well.   To learn more about what you can do with MyChart, go to [email or web address].    Your next appointment:   3 month(s)  The format for your next appointment:   In Person  Provider:   Eleonore Chiquito, MD

## 2019-09-25 DIAGNOSIS — Z03818 Encounter for observation for suspected exposure to other biological agents ruled out: Secondary | ICD-10-CM | POA: Diagnosis not present

## 2019-09-25 DIAGNOSIS — Z20822 Contact with and (suspected) exposure to covid-19: Secondary | ICD-10-CM | POA: Diagnosis not present

## 2019-09-26 ENCOUNTER — Ambulatory Visit (HOSPITAL_COMMUNITY): Payer: 59 | Attending: Cardiology

## 2019-09-26 ENCOUNTER — Other Ambulatory Visit: Payer: Self-pay

## 2019-09-26 DIAGNOSIS — R011 Cardiac murmur, unspecified: Secondary | ICD-10-CM | POA: Diagnosis not present

## 2019-09-26 DIAGNOSIS — Z20828 Contact with and (suspected) exposure to other viral communicable diseases: Secondary | ICD-10-CM | POA: Diagnosis not present

## 2019-09-26 LAB — ECHOCARDIOGRAM COMPLETE
AR max vel: 2.52 cm2
AV Area VTI: 2.91 cm2
AV Area mean vel: 2.51 cm2
AV Mean grad: 5 mmHg
AV Peak grad: 11.3 mmHg
Ao pk vel: 1.68 m/s
Area-P 1/2: 2.91 cm2
S' Lateral: 2.5 cm

## 2019-10-11 ENCOUNTER — Other Ambulatory Visit: Payer: Self-pay | Admitting: Family Medicine

## 2019-10-11 DIAGNOSIS — E782 Mixed hyperlipidemia: Secondary | ICD-10-CM

## 2019-10-11 MED FILL — ROSUVASTATIN CALCIUM 10 MG: 10 | 30 days supply | Qty: 30 | Fill #0

## 2019-10-11 MED FILL — LISINOPRIL-HCTZ 20-12.5 MG: 20-12.5 | 90 days supply | Qty: 90 | Fill #3

## 2019-10-13 ENCOUNTER — Ambulatory Visit: Payer: 59 | Admitting: Interventional Cardiology

## 2019-11-03 ENCOUNTER — Other Ambulatory Visit: Payer: Self-pay

## 2019-11-03 ENCOUNTER — Telehealth (INDEPENDENT_AMBULATORY_CARE_PROVIDER_SITE_OTHER): Payer: 59 | Admitting: Medical

## 2019-11-03 ENCOUNTER — Encounter: Payer: Self-pay | Admitting: Medical

## 2019-11-03 VITALS — Ht 68.0 in | Wt 243.0 lb

## 2019-11-03 DIAGNOSIS — H9201 Otalgia, right ear: Secondary | ICD-10-CM | POA: Diagnosis not present

## 2019-11-03 DIAGNOSIS — J3489 Other specified disorders of nose and nasal sinuses: Secondary | ICD-10-CM

## 2019-11-03 DIAGNOSIS — J0110 Acute frontal sinusitis, unspecified: Secondary | ICD-10-CM

## 2019-11-03 MED ORDER — CLARITHROMYCIN 500 MG PO TABS
500.0000 mg | ORAL_TABLET | Freq: Two times a day (BID) | ORAL | 0 refills | Status: DC
Start: 2019-11-03 — End: 2019-12-21

## 2019-11-03 MED FILL — CLARITHROMYCIN 500 MG TAB: 500 | 10 days supply | Qty: 20 | Fill #0

## 2019-11-03 NOTE — Progress Notes (Signed)
Subjective:     Patient ID: Glenard Haring, female   DOB: 06-01-1970, 49 y.o.   MRN: [id number]  This visit type was conducted due to national recommendations for restrictions regarding the COVID-19 Pandemic (e.g. social distancing) in an effort to limit this patient's exposure and mitigate transmission in our community.  Due to their co-morbid illnesses, this patient is at least at moderate risk for complications without adequate follow up.  This format is felt to be most appropriate for this patient at this time.    Documentation for virtual audio and video telecommunications through [place] encounter:  The patient was located at home. The provider was located in the office. The patient did consent to this visit and is aware of possible charges through their insurance for this visit.  The other persons participating in this telemedicine service were none. Time spent on call was 20 minutes and in review of previous records 20 minutes total.  This virtual service is not related to other E/M service within previous 7 days.   HPI Chief Complaint  Patient presents with  . Sinus Problem    pressure, headache runny nose   . Ear Pain    right side mostly    Virtual consult.  She notes 4 day hx/o sinus pressure, runny nose, blowing yellow mucous from nose.  Worse in the evenings and early morning.  Sinus pressure mainly on right, hurting right ear and tooth.  Has sore throat at night.  Temp around 99.6.  No nausea, no vomiting, no diarrhea.   No loss of smell or taste.  No body aches or chills.   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   No recent covid test.  Using sudafed sinus pain.  She typically gets sinus infections this time of year.  Hx/o asthma, but no problems breathing.  No cough.    Has had the covid vaccine.    No other aggravating or relieving factors. No other complaint.   Review of Systems As in subjective    Objective:   Physical Exam Due to coronavirus  pandemic stay at home measures, patient visit was virtual and they were not examined in person.   Ht 5\' 8"  (1.727 m)   Wt 243 lb (110.2 kg)   LMP 11/07/2012   BMI 36.95 kg/m       Assessment:     Encounter Diagnoses  Name Primary?  . Sinus pressure Yes  . Acute non-recurrent frontal sinusitis   . Right ear pain        Plan:     We discussed her symptoms.  We discussed limitations of virtual consult.  I advised that I cannot completely rule out Covid infection.  I did encourage her to come up and get a Covid test.  She declines at this point.  We will empirically treat for sinusitis with Biaxin.  She can continue Mucinex, rest, hydration, nasal saline flush.  I advise she stop the Sudafed given underlying high blood pressure.  If worse or not improving over the next 3 to 4 days then call back.  I did encourage her to self quarantine for now in the event this could be Covid related  Chanda was seen today for sinus problem and ear pain.  Diagnoses and all orders for this visit:  Sinus pressure  Acute non-recurrent frontal sinusitis  Right ear pain  Other orders -     clarithromycin (BIAXIN) 500 MG tablet; Take 1 tablet (500 mg  total) by mouth 2 (two) times daily.

## 2019-12-15 ENCOUNTER — Ambulatory Visit: Payer: 59 | Admitting: Cardiovascular Disease

## 2019-12-20 ENCOUNTER — Ambulatory Visit: Payer: 59 | Admitting: Podiatry

## 2019-12-21 ENCOUNTER — Other Ambulatory Visit: Payer: Self-pay | Admitting: Family Medicine

## 2019-12-21 ENCOUNTER — Ambulatory Visit: Payer: 59 | Admitting: Family Medicine

## 2019-12-21 ENCOUNTER — Encounter: Payer: Self-pay | Admitting: Family Medicine

## 2019-12-21 ENCOUNTER — Other Ambulatory Visit: Payer: Self-pay

## 2019-12-21 VITALS — BP 112/78 | HR 64 | Ht 69.0 in | Wt 244.8 lb

## 2019-12-21 DIAGNOSIS — M7061 Trochanteric bursitis, right hip: Secondary | ICD-10-CM | POA: Diagnosis not present

## 2019-12-21 DIAGNOSIS — M7661 Achilles tendinitis, right leg: Secondary | ICD-10-CM

## 2019-12-21 MED ORDER — MELOXICAM 15 MG PO TABS: 15.0000 mg | ORAL_TABLET | Freq: Every day | ORAL | 0 refills | Status: DC | PRN

## 2019-12-21 MED FILL — MELOXICAM 15 MG TABLET: 15 | 30 days supply | Qty: 30 | Fill #0

## 2019-12-21 NOTE — Progress Notes (Signed)
Chief Complaint  Patient presents with  . Foot Pain    right foot pain since last Saturday and now her right hip is also hurting. Had appt with [facility] but they cancelled on her and she couldn't get in until 11/8 and she is working that day.   . Immunizations    had covid booster-will send on MyChart.   10/30 started with pain at her R foot while at work. The pain is at the back of the heel, and also goes up behind the lateral malleolus on the right side. Does not extend into the foot. She denies numbness or tingling. Denies any injury.  Usually if she has a flare at posterior heel, she ices and stretches it and it will calm down. This time it doesn't seem to be helping.  She is also now having recurrent pain at her R hip, h/o hip bursitis.  She has had this every 3 months. She has seen podiatrist for this before.  Chart reviewed--seen 10/2017 and 02/2018 by Dr. Paulla Dolly, for L sided PF and R achilles tendonitis. She tates that the last time, when she saw podiatrist, was worse than she is currently. She recalls getting an injection and anti-inflammatories. She wears arch supports in her shoes at work. Her shoes are currently rubbing at the back of the heel,which is painful, so has been wearing open-backed shoes. Tylenol isn't helping. She hasn't tried ibuprofen.  She actually made appt with podiatrist, but they cancelled as Dr. Paulla Dolly wasn't going to be in the [place] office, and nobody could see her before 11/8, and she was working that day, so came here.  She has h/o hip bursitis. Previously used to take prescription ibuprofen (600mg ) without side effects.  Review of chart shows that she has also taken meloxicam several times, and denies having any SE or problems from it.  PMH, PSH, SH reviewed  Outpatient Encounter Medications as of 12/21/2019  Medication Sig  . acetaminophen (TYLENOL) 500 MG tablet Take 1,000 mg by mouth every 6 (six) hours as needed.   . Ascorbic Acid (VITAMIN C) 1000 MG  tablet Take 1,000 mg by mouth daily.  . cholecalciferol (VITAMIN D) 1000 UNITS tablet Take 1,000 Units by mouth daily. Reported on 07/22/2015  . fexofenadine (ALLEGRA) 180 MG tablet Take 180 mg by mouth at bedtime. Reported on 07/22/2015  . fish oil-omega-3 fatty acids 1000 MG capsule Take 3 g by mouth daily. Reported on 07/22/2015  . lisinopril-hydrochlorothiazide (ZESTORETIC) 20-12.5 MG tablet Take 1 tablet by mouth daily.  . rosuvastatin (CRESTOR) 10 MG tablet TAKE 1 TABLET (10 MG TOTAL) BY MOUTH DAILY.  . vitamin B-12 (CYANOCOBALAMIN) 1000 MCG tablet Take 1,000 mcg by mouth daily.  . meloxicam (MOBIC) 15 MG tablet Take 1 tablet (15 mg total) by mouth daily as needed for pain (pain).  . valACYclovir (VALTREX) 1000 MG tablet Take 2 tablets at onset of cold sore.  Repeat once 12 hours later (4 pills/course) (Patient not taking: Reported on 12/21/2019)  . VENTOLIN HFA 108 (90 Base) MCG/ACT inhaler INHALE 2 PUFFS INTO THE LUNGS EVERY 6 HOURS AS NEEDED FOR WHEEZING (Patient not taking: Reported on 12/21/2019)  . [DISCONTINUED] clarithromycin (BIAXIN) 500 MG tablet Take 1 tablet (500 mg total) by mouth 2 (two) times daily.  . [DISCONTINUED] sulfamethoxazole-trimethoprim (BACTRIM DS) 800-160 MG tablet Take 1 tablet by mouth 2 (two) times daily. (Patient not taking: Reported on 11/03/2019)   No facility-administered encounter medications on file as of 12/21/2019.   (NOT  taking mobic prior to today's appt).  Allergies  Allergen Reactions  . Nexium [Esomeprazole Magnesium] Hives, Itching and Swelling  . Penicillins Hives  . Percocet [Oxycodone-Acetaminophen] Itching    ROS: no fever, chills, URI symptoms, wheezing, shortness of breath, chest pain, GI complaints. Denies rashes.  +R foot/hel pain and R hip pain. Moods are good. No other complaints.   PHYSICAL EXAM:  BP 112/78   Pulse 64   Ht 5\' 9"  (1.753 m)   Wt 244 lb 12.8 oz (111 kg)   LMP 11/07/2012   BMI 36.15 kg/m   Well-appearing, pleasant  female. RLE: 2+ pulses Tender at achilles tendon on the right. No swelling She has no reproducible tenderness posterior to the lateral malleolus or elsewhere.  Tender at R trochanteric bursa Also tender at TFL on R, nontender along IT band Psych: normal mood, affect, hygiene and grooming Neuro: alert and oriented, normal strength, gait.   No pain with ankle/foot inversion/eversion against resistance.  ASSESSMENT/PLAN:  Tendonitis, Achilles, right - Cont icing. Trial meloxicam. Advised injections not usually done for this, f/u with podiatrist if not improving. consider NTG patches - Plan: meloxicam (MOBIC) 15 MG tablet  Trochanteric bursitis of right hip - may also have some tenderness at TFL/IT band (not extending to knee). Stretches, NSAID - Plan: meloxicam (MOBIC) 15 MG tablet  Stretches shown   Take the meloxicam once daily with food for at least 7-10 days.  Take it until your pain has completely resolved (you may take it for the full prescription if not improving). If it doesn't resolve, reschedule appointment with Dr. Paulla Dolly or one of his colleagues.

## 2019-12-21 NOTE — Patient Instructions (Addendum)
Take the meloxicam once daily with food for at least 7-10 days.  Take it until your pain has completely resolved (you may take it for the full prescription if not improving). If it doesn't resolve, reschedule appointment with Dr. Paulla Dolly or one of his colleagues.  Do the IT band stretches as shown, as you might have that contributing to the pain from bursitis.  If your pain isn't improving, follow up with podiatrist for the heel pain.  Usually injections are NOT recommended for achilles tendonitis.  Sometimes nitroglycerin patches are used to help bloodflow to help healing.   Rosen's Emergency Medicine: Concepts and Clinical Practice (9th ed., pp. 5643-3295). [place], [place]: [place]. Retrieved from https://www.clinicalkey.com/#!/content/book/[email or web address]">  Achilles Tendinitis  Achilles tendinitis is inflammation of the tough, cord-like band that attaches the lower leg muscles to the heel bone (Achilles tendon). This is usually caused by overusing the tendon and the ankle joint. Achilles tendinitis usually gets better over time with treatment and caring for yourself at home. It can take weeks or months to heal completely. What are the causes? This condition may be caused by:  A sudden increase in exercise or activity, such as running.  Doing the same exercises or activities, such as jumping, over and over.  Not warming up calf muscles before exercising.  Exercising in shoes that are worn out or not made for exercise.  Having arthritis or a bone growth (spur) on the back of the heel bone. This can rub against the tendon and hurt it.  Age-related wear and tear. Tendons become less flexible with age and are more likely to be injured. What are the signs or symptoms? Common symptoms of this condition include:  Pain in the Achilles tendon or in the back of the leg, just above the heel. The pain usually gets worse with exercise.  Stiffness or  soreness in the back of the leg, especially in the morning.  Swelling of the skin over the Achilles tendon.  Thickening of the tendon.  Trouble standing on tiptoe. How is this diagnosed? This condition is diagnosed based on your symptoms and a physical exam. You may have tests, including:  X-rays.  MRI. How is this treated? The goal of treatment is to relieve symptoms and help your injury heal. Treatment may include:  Decreasing or stopping activities that caused the tendinitis. This may mean switching to low-impact exercises like biking or swimming.  Icing the injured area.  Doing physical therapy, including strengthening and stretching exercises.  Taking NSAIDs, such as ibuprofen, to help relieve pain and swelling.  Using supportive shoes, wraps, heel lifts, or a walking boot (air cast).  Having surgery. This may be done if your symptoms do not improve after other treatments.  Using high-energy shock wave impulses to stimulate the healing process (extracorporeal shock wave therapy). This is rare.  Having an injection of medicines that help relieve inflammation (corticosteroids). This is rare. Follow these instructions at home: If you have an air cast:  Wear the air cast as told by your health care provider. Remove it only as told by your health care provider.  Loosen it if your toes tingle, become numb, or turn cold and blue.  Keep it clean.  If the air cast is not waterproof: ? Do not let it get wet. ? Cover it with a watertight covering when you take a bath or shower. Managing pain, stiffness, and swelling   If directed, put ice on the injured area. To do this: ? If  you have a removable air cast, remove it as told by your health care provider. ? Put ice in a plastic bag. ? Place a towel between your skin and the bag. ? Leave the ice on for 20 minutes, 2-3 times a day.  Move your toes often to reduce stiffness and swelling.  Raise (elevate) your foot above the  level of your heart while you are sitting or lying down. Activity  Gradually return to your normal activities as told by your health care provider. Ask your health care provider what activities are safe for you.  Do not do activities that cause pain.  Consider doing low-impact exercises, like cycling or swimming.  Ask your health care provider when it is safe to drive if you have an air cast on your foot.  If physical therapy was prescribed, do exercises as told by your health care provider or physical therapist. General instructions  If directed, wrap your foot with an elastic bandage or other wrap. This can help to keep your tendon from moving too much while it heals. Your health care provider will show you how to wrap your foot correctly.  Wear supportive shoes or heel lifts only as told by your health care provider.  Take over-the-counter and prescription medicines only as told by your health care provider.  Keep all follow-up visits as told by your health care provider. This is important. Contact a health care provider if you:  Have symptoms that get worse.  Have pain that does not get better with medicine.  Develop new, unexplained symptoms.  Develop warmth and swelling in your foot.  Have a fever. Get help right away if you:  Have a sudden popping sound or sensation in your Achilles tendon followed by severe pain.  Cannot move your toes or foot.  Cannot put any weight on your foot.  Your foot or toes become numb and look white or blue even after loosening your bandage or air cast. Summary  Achilles tendinitis is inflammation of the tough, cord-like band that attaches the lower leg muscles to the heel bone (Achilles tendon).  This condition is usually caused by overusing the tendon and the ankle joint. It can also be caused by arthritis or normal aging.  The most common symptoms of this condition include pain, swelling, or stiffness in the Achilles tendon or in  the back of the leg.  This condition is usually treated by decreasing or stopping activities that caused the tendinitis, icing the injured area, taking NSAIDs, and doing physical therapy. This information is not intended to replace advice given to you by your health care provider. Make sure you discuss any questions you have with your health care provider. Document Revised: 06/20/2018 Document Reviewed: 06/20/2018 Elsevier Patient Education  [place].

## 2020-01-15 ENCOUNTER — Telehealth: Payer: Self-pay

## 2020-01-15 ENCOUNTER — Other Ambulatory Visit: Payer: Self-pay | Admitting: Family Medicine

## 2020-01-15 DIAGNOSIS — E782 Mixed hyperlipidemia: Secondary | ICD-10-CM

## 2020-01-15 DIAGNOSIS — Z1159 Encounter for screening for other viral diseases: Secondary | ICD-10-CM

## 2020-01-15 DIAGNOSIS — I10 Essential (primary) hypertension: Secondary | ICD-10-CM

## 2020-01-15 DIAGNOSIS — Z0000 Encounter for general adult medical examination without abnormal findings: Secondary | ICD-10-CM

## 2020-01-15 DIAGNOSIS — Z5181 Encounter for therapeutic drug level monitoring: Secondary | ICD-10-CM

## 2020-01-15 MED FILL — LISINOPRIL-HCTZ 20-12.5 MG: 20-12.5 | 90 days supply | Qty: 90 | Fill #0

## 2020-01-15 MED FILL — ROSUVASTATIN CALCIUM 10 MG: 10 | 30 days supply | Qty: 30 | Fill #1

## 2020-01-15 NOTE — Telephone Encounter (Signed)
Orders entered

## 2020-01-15 NOTE — Telephone Encounter (Signed)
I got pt. Scheduled for her fasting blood work on 01/30/20 for her CPE on 02/01/20 if you could put the order in for that.

## 2020-01-24 MED FILL — LISINOPRIL-HCTZ 20-12.5 MG: 20-12.5 | 90 days supply | Qty: 90 | Fill #0

## 2020-01-30 ENCOUNTER — Other Ambulatory Visit: Payer: 59

## 2020-01-30 ENCOUNTER — Other Ambulatory Visit: Payer: Self-pay

## 2020-01-30 DIAGNOSIS — Z5181 Encounter for therapeutic drug level monitoring: Secondary | ICD-10-CM | POA: Diagnosis not present

## 2020-01-30 DIAGNOSIS — Z1159 Encounter for screening for other viral diseases: Secondary | ICD-10-CM | POA: Diagnosis not present

## 2020-01-30 DIAGNOSIS — H40011 Open angle with borderline findings, low risk, right eye: Secondary | ICD-10-CM | POA: Diagnosis not present

## 2020-01-30 DIAGNOSIS — Z0000 Encounter for general adult medical examination without abnormal findings: Secondary | ICD-10-CM | POA: Diagnosis not present

## 2020-01-30 DIAGNOSIS — E782 Mixed hyperlipidemia: Secondary | ICD-10-CM | POA: Diagnosis not present

## 2020-01-30 DIAGNOSIS — I10 Essential (primary) hypertension: Secondary | ICD-10-CM | POA: Diagnosis not present

## 2020-01-31 LAB — COMPREHENSIVE METABOLIC PANEL
ALT: 16 IU/L (ref 0–32)
AST: 19 IU/L (ref 0–40)
Albumin/Globulin Ratio: 1.4 (ref 1.2–2.2)
Albumin: 4.3 g/dL (ref 3.8–4.8)
Alkaline Phosphatase: 76 IU/L (ref 44–121)
BUN/Creatinine Ratio: 16 (ref 9–23)
BUN: 19 mg/dL (ref 6–24)
Bilirubin Total: <0.2 mg/dL (ref 0.0–1.2)
CO2: 22 mmol/L (ref 20–29)
Calcium: 9.7 mg/dL (ref 8.7–10.2)
Chloride: 104 mmol/L (ref 96–106)
Creatinine, Ser: 1.2 mg/dL — ABNORMAL HIGH (ref 0.57–1.00)
GFR calc Af Amer: 61 mL/min/{1.73_m2} (ref >59)
GFR calc non Af Amer: 53 mL/min/{1.73_m2} — ABNORMAL LOW (ref >59)
Globulin, Total: 3.1 g/dL (ref 1.5–4.5)
Glucose: 92 mg/dL (ref 65–99)
Potassium: 4.3 mmol/L (ref 3.5–5.2)
Sodium: 139 mmol/L (ref 134–144)
Total Protein: 7.4 g/dL (ref 6.0–8.5)

## 2020-01-31 LAB — CBC WITH DIFFERENTIAL/PLATELET
Basophils Absolute: 0 10*3/uL (ref 0.0–0.2)
Basos: 1 %
EOS (ABSOLUTE): 0 10*3/uL (ref 0.0–0.4)
Eos: 1 %
Hematocrit: 39.3 % (ref 34.0–46.6)
Hemoglobin: 12.6 g/dL (ref 11.1–15.9)
Immature Grans (Abs): 0 10*3/uL (ref 0.0–0.1)
Immature Granulocytes: 0 %
Lymphocytes Absolute: 2 10*3/uL (ref 0.7–3.1)
Lymphs: 47 %
MCH: 28.3 pg (ref 26.6–33.0)
MCHC: 32.1 g/dL (ref 31.5–35.7)
MCV: 88 fL (ref 79–97)
Monocytes Absolute: 0.3 10*3/uL (ref 0.1–0.9)
Monocytes: 7 %
Neutrophils Absolute: 1.9 10*3/uL (ref 1.4–7.0)
Neutrophils: 44 %
Platelets: 156 10*3/uL (ref 150–450)
RBC: 4.45 x10E6/uL (ref 3.77–5.28)
RDW: 13 % (ref 11.7–15.4)
WBC: 4.2 10*3/uL (ref 3.4–10.8)

## 2020-01-31 LAB — LIPID PANEL
Chol/HDL Ratio: 3.4 ratio (ref 0.0–4.4)
Cholesterol, Total: 177 mg/dL (ref 100–199)
HDL: 52 mg/dL (ref >39)
LDL Chol Calc (NIH): 105 mg/dL — ABNORMAL HIGH (ref 0–99)
Triglycerides: 114 mg/dL (ref 0–149)
VLDL Cholesterol Cal: 20 mg/dL (ref 5–40)

## 2020-01-31 LAB — HEPATITIS C ANTIBODY: Hep C Virus Ab: <0.1 s/co ratio (ref 0.0–0.9)

## 2020-01-31 NOTE — Patient Instructions (Addendum)
  HEALTH MAINTENANCE RECOMMENDATIONS:  It is recommended that you get at least 30 minutes of aerobic exercise at least 5 days/week (for weight loss, you may need as much as 60-90 minutes). This can be any activity that gets your heart rate up. This can be divided in 10-15 minute intervals if needed, but try and build up your endurance at least once a week.  Weight bearing exercise is also recommended twice weekly.  Eat a healthy diet with lots of vegetables, fruits and fiber.  "Colorful" foods have a lot of vitamins (ie green vegetables, tomatoes, red peppers, etc).  Limit sweet tea, regular sodas and alcoholic beverages, all of which has a lot of calories and sugar.  Up to 1 alcoholic drink daily may be beneficial for women (unless trying to lose weight, watch sugars).  Drink a lot of water.  Calcium recommendations are 1200-1500 mg daily (1500 mg for postmenopausal women or women without ovaries), and vitamin D 1000 IU daily.  This should be obtained from diet and/or supplements (vitamins), and calcium should not be taken all at once, but in divided doses.  Monthly self breast exams and yearly mammograms for women over the age of 39 is recommended.  Sunscreen of at least SPF 30 should be used on all sun-exposed parts of the skin when outside between the hours of 10 am and 4 pm (not just when at beach or pool, but even with exercise, golf, tennis, and yard work!)  Use a sunscreen that says "broad spectrum" so it covers both UVA and UVB rays, and make sure to reapply every 1-2 hours.  Remember to change the batteries in your smoke detectors when changing your clock times in the spring and fall. Carbon monoxide detectors are recommended for your home.  Use your seat belt every time you are in a car, and please drive safely and not be distracted with cell phones and texting while driving.   Please let us know if you start having more palpitations and heart racing.  We can then refer you to the [facility] cardiology group for the monitoring and follow-up. There is no point in doing that right now if you aren't having symptoms.  Just let us know!

## 2020-01-31 NOTE — Progress Notes (Signed)
Chief Complaint  Patient presents with  . Annual Exam    Annual exam, sees Dr.Lowe for gyn care-had appt this year with Dr. Corinna Capra, wait was 2 hours so she rescheduled for next year. She did have mammo done. No new concerns. UA showed 2+ blood and 1+ bili.    Candace Walker is a 49 y.o. female who presents for a complete physical.  See below for labs done prior to visit.  Last seen a month ago with Right achilles tendonitis and trochanteric bursitis (which is fairly chronic for her). She was treated with meloxicam and to f/u with podiatrist if not improving. Her heel pain is better, but still has some discomfort.  Eventually plans to see Dr. Paulla Dolly again.  The hip pain has resolved.  She was seen in July with palpitations, episodes of tachycardia and was referred to cardiology. She saw Dr. Audie Box. Zio patch was ordered and echo.  She states she never got the Zio patch (and she was charged for it).  It took a while to get this figured out. She would like to be seen by another office, another provider. Prefers to go to the [facility]. She currently is no longer having any palpitations or tachycardia.  Echocardiogram in 09/2019 showed: 1. Left ventricular ejection fraction, by estimation, is 60 to 65%. The  left ventricle has normal function. The left ventricle has no regional  wall motion abnormalities. Left ventricular diastolic parameters are  consistent with Grade II diastolic  dysfunction (pseudonormalization).  2. Right ventricular systolic function is normal. The right ventricular  size is mildly enlarged. There is normal pulmonary artery systolic  pressure.  3. Left atrial size was mildly dilated.  4. Right atrial size was mildly dilated.  5. The mitral valve is normal in structure. No evidence of mitral valve  regurgitation. No evidence of mitral stenosis.  6. The aortic valve is tricuspid. Aortic valve regurgitation is not  visualized. Mild aortic valve sclerosis is  present, with no evidence of  aortic valve stenosis.  7. The inferior vena cava is normal in size with greater than 50%  respiratory variability, suggesting right atrial pressure of 3 mmHg.   Hypertension follow-up: She is compliant with taking lisinopril HCTZ and denies side effects. Blood pressures elsewhere are fine when checkedat work, running 116-121/70's Denies dizziness, headaches, chest pain or edema.  No further palpitations. She wears compression stocking at work.  Allergies and asthma: She hasn't taken any singulair since last year. Since COVID, there had been no visitors with perfume, and no flowers, so she really hasn't had any problems. Even now, she continues to do well, no flares.  She continues to take [employer], and is doing well.  Hasn't needed to use albuterol in a very long time. Hasn't had any flares since COVID started.  She doesn't have an active FMLA form, but hasn't needed it.  In the past, after exposure to perfume or flowers, she needed to takea 30 minute break,albuterol prn(and avoids going back into that particular room for the rest of the shift). Tries to avoid by switching with somebody when she sees a lot of flowers in the patient's room, before any problems.  Again, not an issue since COVID. Last spirometry was normal in 01/2019.  H/o vitamin D deficiency: She is compliant with daily supplement. Last check was 11/2013, level was 37 on the same vitamin.  Hyperlipidemia:Patient tries to follow a lowfat, low cholesterol diet. She was started on rosuvastatin  last year. She is tolerating this without any side effects.  She has noted some heartburn during the day.  She takes the crestor at night.  Current diet includes: Egg whites only, no red meat. Not much cheese. Eats a lot of chicken, oatmeal. Uses vinaigrette dressings on salad (doesn't eat often).  Microscopic hematuria:  She recalls seeing a urologist many years ago (at [facility] She  reports she had a cystoscopy and recalls being told everything was normal.  She has seen some blood (microscopic) ever since.  Review of chart shows trace to 1+ in the past.. CT abdomen okay in 03/2013.   Immunization History  Administered Date(s) Administered  . Influenza Split 10/15/2010, 11/19/2011, 10/31/2013  . Influenza Whole 11/12/2012  . Influenza-Unspecified 11/05/2014, 11/17/2015, 11/09/2016, 11/16/2017, 11/11/2019  . PFIZER SARS-COV-2 Vaccination 02/05/2019, 02/25/2019, 11/24/2019  . Pneumococcal Polysaccharide-23 04/24/2013  . Tdap 04/24/2013   Yearly flu shots at work [employer] employee) Last Pap smear: per GYN Dr. Corinna Capra, 03/2018. Has appt scheduled for 04/2020 Last mammogram:at Dr. Gregor Hams office, 03/2018; she had mammo last year at his office, and has appt scheduled for 2022. Last colonoscopy: 11/2018.  Tortuous colon, small internal hemorrhoids, no polyps.  Rec repeat 5 years (due to FHx) Last DEXA: never Dentist: twice yearly Ophtho: Yearly Exercise:3x/week squats, jumping jacks (3 sets of 50 JJ, 15 squats x 4 reps), stretching and situps.  Doesn't exercise when she works nights. She used to do elliptical at the gym, hasn't been back since COVID (went back, but when precautions weakened, she stopped going).   PMH, PSH, SH and FH reviewed and updated. Parents still living with her, father with lung cancer, has had some decline.  Outpatient Encounter Medications as of 02/01/2020  Medication Sig  . Ascorbic Acid (VITAMIN C) 1000 MG tablet Take 1,000 mg by mouth daily.  . cholecalciferol (VITAMIN D) 1000 UNITS tablet Take 1,000 Units by mouth daily. Reported on 07/22/2015  . fexofenadine (ALLEGRA) 180 MG tablet Take 180 mg by mouth at bedtime. Reported on 07/22/2015  . fish oil-omega-3 fatty acids 1000 MG capsule Take 3 g by mouth daily. Reported on 07/22/2015  . fluticasone (FLONASE) 50 MCG/ACT nasal spray Place 1 spray into both nostrils daily.  Marland Kitchen lisinopril-hydrochlorothiazide  (ZESTORETIC) 20-12.5 MG tablet TAKE 1 TABLET BY MOUTH DAILY.  . rosuvastatin (CRESTOR) 10 MG tablet TAKE 1 TABLET (10 MG TOTAL) BY MOUTH DAILY.  . vitamin B-12 (CYANOCOBALAMIN) 1000 MCG tablet Take 1,000 mcg by mouth daily.  Marland Kitchen acetaminophen (TYLENOL) 500 MG tablet Take 1,000 mg by mouth every 6 (six) hours as needed.  (Patient not taking: Reported on 02/01/2020)  . meloxicam (MOBIC) 15 MG tablet Take 1 tablet (15 mg total) by mouth daily as needed for pain (pain). (Patient not taking: Reported on 02/01/2020)  . valACYclovir (VALTREX) 1000 MG tablet Take 2 tablets at onset of cold sore.  Repeat once 12 hours later (4 pills/course) (Patient not taking: No sig reported)  . VENTOLIN HFA 108 (90 Base) MCG/ACT inhaler INHALE 2 PUFFS INTO THE LUNGS EVERY 6 HOURS AS NEEDED FOR WHEEZING (Patient not taking: No sig reported)   No facility-administered encounter medications on file as of 02/01/2020.   Allergies  Allergen Reactions  . Nexium [Esomeprazole Magnesium] Hives, Itching and Swelling  . Penicillins Hives  . Percocet [Oxycodone-Acetaminophen] Itching    ROS: The patient denies anorexia, fever, headaches, vision changes, decreased hearing, ear pain, sore throat, breast concerns, chest pain, dizziness, syncope, dyspnea on exertion, cough, swelling, nausea, vomiting,  diarrhea, constipation, abdominal pain, melena, hematochezia, hematuria, incontinence, dysuria, vaginal bleeding, discharge, odor or itch, genital lesions, numbness, tingling, weakness, tremor, suspicious skin lesions, depression, anxiety, abnormal bleeding or enlarged lymph nodes. Allergies are controlled; breathing is good. Tachycardia episodes resolved Achilles tendonitis R--improved but not resolved. R hip bursitis has resolved Some heartburn during the day recently.    PHYSICAL EXAM:  BP 128/74   Pulse 76   Ht 5\' 8"  (1.727 m)   Wt 241 lb 12.8 oz (109.7 kg)   LMP 11/07/2012   BMI 36.77 kg/m   Wt Readings from Last 3  Encounters:  02/01/20 241 lb 12.8 oz (109.7 kg)  12/21/19 244 lb 12.8 oz (111 kg)  11/03/19 243 lb (110.2 kg)    General Appearance:   Alert, cooperative, no distress, appears stated age. She is in good spirits.  Head:   Normocephalic, without obvious abnormality, atraumatic  Eyes:   PERRL, conjunctiva/corneas clear, EOM's intact, fundi benign.  Ears:   Normal TM's and external ear canals  Nose:  Not examined, wearing mask due to COVID-19 pandemic  Throat:  Not examined, wearing mask due to COVID-19 pandemic  Neck:  Supple, no lymphadenopathy; thyroid: no enlargement/tenderness/nodules; no carotid bruit or JVD.   Back:  Spine nontender, no curvature, ROM normal, no CVAtenderness  Lungs:   Clear to auscultation bilaterally without wheezes, rales orronchi; respirations unlabored  Chest Wall:   No tenderness or deformity  Heart:   Regular rate and rhythm, S1 and S2 normal, no murmur, rub or gallop, no ectopy  Breast Exam:   Deferred to GYN  Abdomen:   Soft, non-tender, nondistended, normoactive bowel sounds, no masses, no hepatosplenomegaly  Genitalia:   Deferred to GYN     Extremities:  No clubbing, cyanosis or edema. Mildly tender at R achilles tendon. No swelling  Pulses:  2+ and symmetric all extremities  Skin:  Skin color, texture, turgor normal, no rashes or lesions  Lymph nodes:  Cervical, supraclavicular nodes normal  Neurologic:  Normal strength, sensation and gait; reflexes 2+ and symmetric throughout   Psych: Normal mood, affect, hygiene and grooming     Chemistry      Component Value Date/Time   NA 139 01/30/2020 0850   K 4.3 01/30/2020 0850   CL 104 01/30/2020 0850   CO2 22 01/30/2020 0850   BUN 19 01/30/2020 0850   CREATININE 1.20 (H) 01/30/2020 0850   CREATININE 1.00 12/30/2016 1046      Component Value Date/Time   CALCIUM 9.7 01/30/2020 0850   ALKPHOS 76 01/30/2020 0850   AST  19 01/30/2020 0850   ALT 16 01/30/2020 0850   BILITOT <0.2 01/30/2020 0850     Fasting glucose 92  Lab Results  Component Value Date   CHOL 177 01/30/2020   HDL 52 01/30/2020   LDLCALC 105 (H) 01/30/2020   TRIG 114 01/30/2020   CHOLHDL 3.4 01/30/2020   Lab Results  Component Value Date   WBC 4.2 01/30/2020   HGB 12.6 01/30/2020   HCT 39.3 01/30/2020   MCV 88 01/30/2020   PLT 156 01/30/2020   HepC Ab negative  Urine: SG 1.025, mod blood   ASSESSMENT/PLAN  Annual physical exam - Plan: POCT Urinalysis DIP (Proadvantage Device)  Essential hypertension, benign - Well controlled  Palpitations - resolved. Reviewed echo. If has recurrent palpitations/tachycardia, refer to [facility] street office for [employer]. She will contact us if recurs  Need for shingles vaccine - Plan: Varicella-zoster vaccine IM (Shingrix)  Mixed  hyperlipidemia - well controlled, continue crestor - Plan: rosuvastatin (CRESTOR) 10 MG tablet  Microscopic hematuria - more blood than previously noted, currently very concentrated.  She will return for recheck with micro - Plan: Urinalysis, Routine w reflex microscopic   Discussed monthly self breast exams and yearly mammograms; at least 30 minutes of aerobic activity at least 5 days/week, weight-bearing exercise at least 2x/week; proper sunscreen use reviewed; healthy diet, including goals of calcium and vitamin D intake and alcohol recommendations (less than or equal to 1 drink/day) reviewed; regular seatbelt use; changing batteries in smoke detectors. Immunization recommendations discussed--continue yearly flu shots. Shingrix #1 given today, to return in 2 mos for 2nd. Colonoscopy screening, UTD.  F/u--lab visit for recheck urine, NV for Shingrix #2 in 2 mos CPE 1 year.  Please let us know if you start having more palpitations and heart racing.  We can then refer you to the [facility] cardiology group for the monitoring and follow-up. There is no point  in doing that right now if you aren't having symptoms.  Just let us know!  If you start having flares of your asthma and need the FMLA forms filled out again, send them our way.  Stay well hydrated.  Return for a lab visit (schedule visit) for the urine specimen to be sent to the lab, at your convenience. Make sure you are well hydrated that day.

## 2020-02-01 ENCOUNTER — Encounter: Payer: Self-pay | Admitting: Family Medicine

## 2020-02-01 ENCOUNTER — Other Ambulatory Visit: Payer: Self-pay

## 2020-02-01 ENCOUNTER — Other Ambulatory Visit: Payer: Self-pay | Admitting: Family Medicine

## 2020-02-01 ENCOUNTER — Ambulatory Visit (INDEPENDENT_AMBULATORY_CARE_PROVIDER_SITE_OTHER): Payer: 59 | Admitting: Family Medicine

## 2020-02-01 VITALS — BP 128/74 | HR 76 | Ht 68.0 in | Wt 241.8 lb

## 2020-02-01 DIAGNOSIS — I10 Essential (primary) hypertension: Secondary | ICD-10-CM | POA: Diagnosis not present

## 2020-02-01 DIAGNOSIS — R002 Palpitations: Secondary | ICD-10-CM

## 2020-02-01 DIAGNOSIS — R3129 Other microscopic hematuria: Secondary | ICD-10-CM | POA: Diagnosis not present

## 2020-02-01 DIAGNOSIS — Z23 Encounter for immunization: Secondary | ICD-10-CM | POA: Diagnosis not present

## 2020-02-01 DIAGNOSIS — E782 Mixed hyperlipidemia: Secondary | ICD-10-CM

## 2020-02-01 DIAGNOSIS — J45909 Unspecified asthma, uncomplicated: Secondary | ICD-10-CM

## 2020-02-01 DIAGNOSIS — Z0000 Encounter for general adult medical examination without abnormal findings: Secondary | ICD-10-CM | POA: Diagnosis not present

## 2020-02-01 LAB — POCT URINALYSIS DIP (PROADVANTAGE DEVICE)
Glucose, UA: NEGATIVE mg/dL
Ketones, POC UA: NEGATIVE mg/dL
Leukocytes, UA: NEGATIVE
Nitrite, UA: NEGATIVE
Protein Ur, POC: NEGATIVE mg/dL
Specific Gravity, Urine: 1.025
Urobilinogen, Ur: NEGATIVE
pH, UA: 5.5 (ref 5.0–8.0)

## 2020-02-01 MED ORDER — ROSUVASTATIN CALCIUM 10 MG PO TABS: 10.0000 mg | ORAL_TABLET | Freq: Every day | ORAL | 3 refills | Status: DC

## 2020-02-05 ENCOUNTER — Other Ambulatory Visit: Payer: 59

## 2020-02-05 ENCOUNTER — Other Ambulatory Visit: Payer: Self-pay

## 2020-02-05 DIAGNOSIS — R3129 Other microscopic hematuria: Secondary | ICD-10-CM

## 2020-02-06 LAB — URINALYSIS, ROUTINE W REFLEX MICROSCOPIC
Bilirubin, UA: NEGATIVE
Glucose, UA: NEGATIVE
Ketones, UA: NEGATIVE
Leukocytes,UA: NEGATIVE
Nitrite, UA: NEGATIVE
Protein,UA: NEGATIVE
RBC, UA: NEGATIVE
Specific Gravity, UA: 1.007 (ref 1.005–1.030)
Urobilinogen, Ur: 0.2 mg/dL (ref 0.2–1.0)
pH, UA: 6 (ref 5.0–7.5)

## 2020-02-08 MED FILL — ROSUVASTATIN CALCIUM 10 MG: 10 | 90 days supply | Qty: 90 | Fill #0

## 2020-02-20 MED FILL — ROSUVASTATIN CALCIUM 10 MG: 10 | 90 days supply | Qty: 90 | Fill #0

## 2020-03-06 ENCOUNTER — Other Ambulatory Visit: Payer: Self-pay | Admitting: Family Medicine

## 2020-03-06 ENCOUNTER — Telehealth: Payer: Self-pay | Admitting: *Deleted

## 2020-03-06 DIAGNOSIS — J45909 Unspecified asthma, uncomplicated: Secondary | ICD-10-CM

## 2020-03-06 MED ORDER — ALBUTEROL SULFATE HFA 108 (90 BASE) MCG/ACT IN AERS: INHALATION_SPRAY | RESPIRATORY_TRACT | 1 refills | Status: DC

## 2020-03-06 MED ORDER — VALACYCLOVIR HCL 1 G PO TABS: ORAL_TABLET | ORAL | 0 refills | Status: DC

## 2020-03-06 NOTE — Telephone Encounter (Signed)
Patient called and asked for rx's to be sent to [facility] for valcyclovir and albuterol inhaler please.

## 2020-03-07 MED FILL — valACYclovir HCL 1 GM TABS: 1 | 5 days supply | Qty: 20 | Fill #0

## 2020-03-07 MED FILL — ALBUTEROL SULFATE HFA 108 (: 108 (90 BAS | 25 days supply | Qty: 18 | Fill #0

## 2020-03-22 ENCOUNTER — Ambulatory Visit: Payer: 59 | Admitting: Podiatry

## 2020-04-03 ENCOUNTER — Other Ambulatory Visit: Payer: Self-pay

## 2020-04-03 ENCOUNTER — Other Ambulatory Visit (INDEPENDENT_AMBULATORY_CARE_PROVIDER_SITE_OTHER): Payer: 59

## 2020-04-03 DIAGNOSIS — Z23 Encounter for immunization: Secondary | ICD-10-CM

## 2020-04-05 ENCOUNTER — Telehealth: Payer: Self-pay | Admitting: Family Medicine

## 2020-04-05 NOTE — Telephone Encounter (Signed)
Requested records received from [facility]

## 2020-04-08 ENCOUNTER — Encounter: Payer: Self-pay | Admitting: *Deleted

## 2020-04-08 ENCOUNTER — Encounter: Payer: Self-pay | Admitting: Family Medicine

## 2020-04-25 ENCOUNTER — Other Ambulatory Visit: Payer: Self-pay | Admitting: Family Medicine

## 2020-04-25 DIAGNOSIS — I10 Essential (primary) hypertension: Secondary | ICD-10-CM

## 2020-04-25 MED FILL — LISINOPRIL-HCTZ 20-12.5 MG: 20-12.5 | 90 days supply | Qty: 90 | Fill #0

## 2020-05-10 ENCOUNTER — Other Ambulatory Visit: Payer: Self-pay

## 2020-05-10 ENCOUNTER — Other Ambulatory Visit: Payer: Self-pay | Admitting: Podiatry

## 2020-05-10 ENCOUNTER — Ambulatory Visit (INDEPENDENT_AMBULATORY_CARE_PROVIDER_SITE_OTHER): Payer: 59 | Admitting: Podiatry

## 2020-05-10 ENCOUNTER — Ambulatory Visit (INDEPENDENT_AMBULATORY_CARE_PROVIDER_SITE_OTHER): Payer: 59

## 2020-05-10 DIAGNOSIS — M25571 Pain in right ankle and joints of right foot: Secondary | ICD-10-CM

## 2020-05-10 DIAGNOSIS — M7661 Achilles tendinitis, right leg: Secondary | ICD-10-CM

## 2020-05-10 MED ORDER — TRIAMCINOLONE ACETONIDE 10 MG/ML IJ SUSP
10.0000 mg | Freq: Once | INTRAMUSCULAR | Status: AC
  Administered 2020-05-10: 10 mg

## 2020-05-10 MED ORDER — DICLOFENAC SODIUM 75 MG PO TBEC: 75.0000 mg | DELAYED_RELEASE_TABLET | Freq: Two times a day (BID) | ORAL | 2 refills | Status: DC

## 2020-05-10 MED FILL — DICLOFENAC SOD EC 75 MG TAB: 75 | 25 days supply | Qty: 50 | Fill #0

## 2020-05-10 NOTE — Progress Notes (Signed)
Subjective:   Patient ID: Candace Walker, female   DOB: 50 y.o.   MRN: [id number]   HPI Patient presents stating that she has developed a lot of pain in the back of the right heel over the last 6 months.  States it did very well for about a year and a half but has been bothering her again    ROS      Objective:  Physical Exam  Neurovascular status intact with posterior pain right heel at the insertional point of the Achilles into the posterior heel bone.  Very tender when pressed medial side     Assessment:  Inflammatory Achilles tendinitis right with history of this problem not treated for over 2 years     Plan:  H&P x-rays reviewed and today I did sterile prep and did a careful posterior injection 3 mg dexamethasone Kenalog 5 mg Xylocaine after explaining chances for rupture.  Patient will be seen back to recheck again in the next 6 weeks or earlier if needed and will utilize stretching exercises  X-rays indicate moderate posterior spur formation no indication stress fracture or other pathology

## 2020-05-30 NOTE — Telephone Encounter (Signed)
Monitor never worn/returned. Order will be cancelled.

## 2020-06-18 DIAGNOSIS — Z20822 Contact with and (suspected) exposure to covid-19: Secondary | ICD-10-CM | POA: Diagnosis not present

## 2020-07-23 ENCOUNTER — Other Ambulatory Visit (HOSPITAL_COMMUNITY): Payer: Self-pay

## 2020-07-23 MED FILL — Lisinopril & Hydrochlorothiazide Tab 20-12.5 MG: ORAL | 90 days supply | Qty: 90 | Fill #0 | Status: AC

## 2020-07-26 DIAGNOSIS — R102 Pelvic and perineal pain: Secondary | ICD-10-CM | POA: Diagnosis not present

## 2020-07-26 DIAGNOSIS — R1031 Right lower quadrant pain: Secondary | ICD-10-CM | POA: Diagnosis not present

## 2020-08-05 ENCOUNTER — Ambulatory Visit: Payer: 59 | Admitting: Family Medicine

## 2020-08-05 ENCOUNTER — Encounter: Payer: Self-pay | Admitting: Family Medicine

## 2020-08-05 ENCOUNTER — Other Ambulatory Visit: Payer: Self-pay

## 2020-08-05 VITALS — BP 114/70 | HR 80 | Temp 98.2°F | Ht 68.0 in | Wt 236.8 lb

## 2020-08-05 DIAGNOSIS — R109 Unspecified abdominal pain: Secondary | ICD-10-CM

## 2020-08-05 DIAGNOSIS — M899 Disorder of bone, unspecified: Secondary | ICD-10-CM

## 2020-08-05 DIAGNOSIS — R102 Pelvic and perineal pain: Secondary | ICD-10-CM

## 2020-08-05 LAB — POCT URINALYSIS DIP (PROADVANTAGE DEVICE)
Bilirubin, UA: NEGATIVE
Glucose, UA: NEGATIVE mg/dL
Ketones, POC UA: NEGATIVE mg/dL
Leukocytes, UA: NEGATIVE
Nitrite, UA: NEGATIVE
Protein Ur, POC: NEGATIVE mg/dL
Specific Gravity, Urine: 1.01
Urobilinogen, Ur: NEGATIVE
pH, UA: 6.5 (ref 5.0–8.0)

## 2020-08-05 NOTE — Progress Notes (Signed)
Chief Complaint  Patient presents with   Abdominal Pain    Abdominal since June 1st-went to Ob/Gyn June 10th. They did ultrasound, it was normal. She was told if it continued to follow up and get CT of the abdomen/pelvis. Took laxative 07/26/20 and it didn't help. Still having pain, no change-no better.    Pain started 6/1 in her lower abdomen. 6/10 saw PA at [facility] office with complaint of pelvic pain.  She had a pelvic ultrasound, reportedly normal.  She had been concerned it was her ovaries, which is why she went to GYN first.  Pain hasn't changed in the last 10 days.  She takes Tylenol twice daily, which helps.  Pain comes back if she doesn't take it.  She tried heat at first, and it does help (but still needs tylenol). Hasn't tried any ibuprofen (worries about blood pressure). She doesn't have any rx ibuprofen, still has some meloxicam at home.  Pain is mainly on the right, sometimes spread to suprapubic area, and sometimes it spreads across the whole lower abdomen. She  mostly feels it when she is walking and laying down. If she does sit-up/crunches, it hurts more the next day.  No change in activity, no known injury.  +lifting at work, no different than usual, no pain with any activity. Stretching hip flexor feels good, but hurts more after.  Denies constipation, has bowel movements twice daily, no blood or mucus, same as her usual routine. Denies dysuria, no urgency or frequency, urine is clear.  Pain feels similar to pain she had in R hip back in 2015.  MRI pelvic 03/2013: IMPRESSION:  1. Small subcortical T2 hyperintense focus along the anterior  superior right femoral head with surrounding bony sclerosis. The  lack of sharp definition and overall features favor a degenerative  subcortical osteochondral lesion over avascular necrosis.  2. Small right ovarian cyst.  3. Uterine fibroids.  4. Moderate left and mild right hamstring tendinopathy.    PMH, PSH, SH  reviewed  Outpatient Encounter Medications as of 08/05/2020  Medication Sig   Ascorbic Acid (VITAMIN C) 1000 MG tablet Take 1,000 mg by mouth daily.   cholecalciferol (VITAMIN D) 1000 UNITS tablet Take 1,000 Units by mouth daily. Reported on 07/22/2015   fexofenadine (ALLEGRA) 180 MG tablet Take 180 mg by mouth at bedtime. Reported on 07/22/2015   fish oil-omega-3 fatty acids 1000 MG capsule Take 3 g by mouth daily. Reported on 07/22/2015   fluticasone (FLONASE) 50 MCG/ACT nasal spray Place 1 spray into both nostrils daily.   lisinopril-hydrochlorothiazide (ZESTORETIC) 20-12.5 MG tablet TAKE 1 TABLET BY MOUTH DAILY.   rosuvastatin (CRESTOR) 10 MG tablet TAKE 1 TABLET (10 MG TOTAL) BY MOUTH DAILY.   vitamin B-12 (CYANOCOBALAMIN) 1000 MCG tablet Take 1,000 mcg by mouth daily.   acetaminophen (TYLENOL) 500 MG tablet Take 1,000 mg by mouth every 6 (six) hours as needed.  (Patient not taking: No sig reported)   albuterol (VENTOLIN HFA) 108 (90 Base) MCG/ACT inhaler INHALE 2 PUFFS INTO THE LUNGS EVERY 6 HOURS AS NEEDED FOR WHEEZING (Patient not taking: Reported on 08/05/2020)   valACYclovir (VALTREX) 1000 MG tablet TAKE 2 TABLETS AT ONSET OF COLD SORE. REPEAT ONCE 12 HOURS LATER (4 PILLS/COURSE) (Patient not taking: Reported on 08/05/2020)   [DISCONTINUED] diclofenac (VOLTAREN) 75 MG EC tablet TAKE 1 TABLET (75 MG TOTAL) BY MOUTH 2 (TWO) TIMES DAILY.   [DISCONTINUED] meloxicam (MOBIC) 15 MG tablet TAKE 1 TABLET (15 MG TOTAL) BY MOUTH DAILY AS  NEEDED FOR PAIN (PAIN). (Patient not taking: Reported on 02/01/2020)   No facility-administered encounter medications on file as of 08/05/2020.   Allergies  Allergen Reactions   Nexium [Esomeprazole Magnesium] Hives, Itching and Swelling   Penicillins Hives   Percocet [Oxycodone-Acetaminophen] Itching   ROS: No fever, chills, no wheezing.  No GI complaints. Some wt loss noted--less eating, cut back on carbs See HPI   PHYSICAL EXAM:  BP 114/70   Pulse 80    Temp 98.2 F (36.8 C) (Tympanic)   Ht _0  (1.727 m)   Wt 236 lb 12.8 oz (107.4 kg)   LMP 11/07/2012   BMI 36.01 kg/m   Wt Readings from Last 3 Encounters:  08/05/20 236 lb 12.8 oz (107.4 kg)  02/01/20 241 lb 12.8 oz (109.7 kg)  12/21/19 244 lb 12.8 oz (111 kg)   Well-appearing, pleasant female, in no acute distress HEENT: conjunctiva and sclera are clear, EOMI. Wearing mask Heart: regular rate and rhythm Lungs: clear bilaterally Abdomen: soft, nontender, no organomegaly or mass. Pain at R groin with hip flexion against resistance Tender at the superior and right side of the pubic bone No pain with hip adduction against resistance (or abduction) No pain with hip flexion on the left FROM of hips  Urine dip: Trace blood, SG 1.010  ASSESSMENT/PLAN:  Pubic bone pain - across superior portion and R side; pain worse after crunches and with certain leg movements, suggesting MSK. Trial of NSAID, heat, stretches  Abdominal pain, unspecified abdominal location - abdomen is nontender; pain is at groin and pubic bone. Pt reassurred, no need for further imaging at this time - Plan: POCT Urinalysis DIP (Proadvantage Device)  Pelvic pain - normal pelvic exam and [place] at GYN per pt.   If pain persists, can consider labs--CBC, c-met--discussed, but will hold off, seems external/MSK  Course of meloxicam. Unsure how much she has at home.  Will contact us for refill if she has less than 10 days worth.  Heat TID Topical meds such as Salonpas with lidocaine or Biofreeze Discussed/shown stretches  [place] of the [place] x 3 weeks  If not responding to course of NSAIDs, refer to sports medicine.  All questions answered. Counseled on risks/SE of NSAIDS  I spent 35 minutes dedicated to the care of this patient, including pre-visit review of records, face to face time, post-visit ordering of testing and documentation.

## 2020-08-05 NOTE — Patient Instructions (Signed)
Start taking the meloxicam once daily with food. You may use this for up to 2 weeks (longer if pain is responding, just taking longer to fully resolve). If you haven't gotten any improvement (other than temporary), then we should refer either to orthopedist or sports medicine (the latter my preference)

## 2020-08-06 ENCOUNTER — Telehealth: Payer: Self-pay | Admitting: Family Medicine

## 2020-08-06 ENCOUNTER — Other Ambulatory Visit (HOSPITAL_COMMUNITY): Payer: Self-pay

## 2020-08-06 MED ORDER — MELOXICAM 15 MG PO TABS
15.0000 mg | ORAL_TABLET | Freq: Every day | ORAL | 0 refills | Status: DC
  Filled 2020-08-06: qty 15, 15d supply, fill #0

## 2020-08-06 NOTE — Telephone Encounter (Signed)
Pt called and states that she does not have 10 day supply of meloxicam left. Please send to [place]. Pt can be reached at  [phone].

## 2020-08-06 NOTE — Telephone Encounter (Signed)
Done

## 2020-10-07 ENCOUNTER — Other Ambulatory Visit (HOSPITAL_COMMUNITY): Payer: Self-pay

## 2020-10-07 MED FILL — Rosuvastatin Calcium Tab 10 MG: ORAL | 90 days supply | Qty: 90 | Fill #0 | Status: AC

## 2020-10-07 MED FILL — Lisinopril & Hydrochlorothiazide Tab 20-12.5 MG: ORAL | 90 days supply | Qty: 90 | Fill #1 | Status: AC

## 2020-10-23 ENCOUNTER — Other Ambulatory Visit (HOSPITAL_COMMUNITY): Payer: Self-pay

## 2020-10-23 MED FILL — Diclofenac Sodium Tab Delayed Release 75 MG: ORAL | 25 days supply | Qty: 50 | Fill #0 | Status: CN

## 2020-10-31 ENCOUNTER — Other Ambulatory Visit (HOSPITAL_COMMUNITY): Payer: Self-pay

## 2020-11-11 ENCOUNTER — Other Ambulatory Visit (HOSPITAL_COMMUNITY): Payer: Self-pay

## 2020-11-11 MED FILL — Diclofenac Sodium Tab Delayed Release 75 MG: ORAL | 25 days supply | Qty: 50 | Fill #0 | Status: AC

## 2020-12-26 ENCOUNTER — Other Ambulatory Visit (HOSPITAL_COMMUNITY): Payer: Self-pay

## 2020-12-26 MED FILL — Diclofenac Sodium Tab Delayed Release 75 MG: ORAL | 25 days supply | Qty: 50 | Fill #1 | Status: AC

## 2020-12-30 ENCOUNTER — Other Ambulatory Visit (HOSPITAL_COMMUNITY): Payer: Self-pay

## 2020-12-30 ENCOUNTER — Other Ambulatory Visit: Payer: Self-pay | Admitting: Family Medicine

## 2020-12-30 DIAGNOSIS — I10 Essential (primary) hypertension: Secondary | ICD-10-CM

## 2020-12-30 MED ORDER — LISINOPRIL-HYDROCHLOROTHIAZIDE 20-12.5 MG PO TABS
1.0000 | ORAL_TABLET | Freq: Every day | ORAL | 0 refills | Status: DC
  Filled 2020-12-30: qty 90, 90d supply, fill #0

## 2020-12-30 NOTE — Telephone Encounter (Signed)
Ok per [employer]

## 2021-01-01 ENCOUNTER — Other Ambulatory Visit (HOSPITAL_COMMUNITY): Payer: Self-pay

## 2021-01-23 ENCOUNTER — Other Ambulatory Visit (HOSPITAL_COMMUNITY): Payer: Self-pay

## 2021-01-23 MED FILL — Rosuvastatin Calcium Tab 10 MG: ORAL | 90 days supply | Qty: 90 | Fill #1 | Status: AC

## 2021-01-31 DIAGNOSIS — Z1231 Encounter for screening mammogram for malignant neoplasm of breast: Secondary | ICD-10-CM | POA: Diagnosis not present

## 2021-01-31 DIAGNOSIS — Z01419 Encounter for gynecological examination (general) (routine) without abnormal findings: Secondary | ICD-10-CM | POA: Diagnosis not present

## 2021-01-31 DIAGNOSIS — Z6835 Body mass index (BMI) 35.0-35.9, adult: Secondary | ICD-10-CM | POA: Diagnosis not present

## 2021-02-04 NOTE — Progress Notes (Signed)
Chief Complaint  Patient presents with   Annual Exam    Nonfasting annual exam. Saw Dr.Lowe 12/16 had pelvic and mammo-I will get those. Seeing Dr. Peter Garter in Jan 2023. Does not want covid booster. No concerns.     Candace Walker is a 50 y.o. female who presents for a complete physical.   She was last seen in June, with complaints of pain in groin, pubic bone (had evaluation by GYN when she thought it might be related to ovaries). She was prescribed NSAIDs, heat/stretches shown. This resolved after about 2 weeks.  No recurrence.  She has h/o chronic issues with trochanteric bursitis.  Last year she also had issues with achilles tendonitis, and had seen Dr. Paulla Dolly.  She currently denies any recurrence. She has a memory foam mattress, which helps a lot.  She has h/o palpitations, episodes of tachycardia. She saw Dr. Audie Box in 08/2019. Zio patch was ordered and echo. Echo showed normal EF, grade II diastolic dysfunction, otherwise okay. She reported she never got the Zio patch (and she was charged for it).  It took a while to get this figured out. She preferred to go to another cardiologist, at the [facility] office, but since her symptoms had resolved, she was not referred. Currently she denies any recurrent issues with palpitations or tachycardia.   Hypertension follow-up:  She is compliant with taking lisinopril HCTZ and denies side effects. Blood pressures elsewhere are fine.  2 days ago it was 116/68, P 66.  It usually runs 106-121/60's-70's. Denies dizziness, headaches, chest pain or edema.  No further palpitations. She wears compression stockings at work.  BP Readings from Last 3 Encounters:  08/05/20 114/70  02/01/20 128/74  12/21/19 112/78    Allergies and asthma:  She hasn't taken any singulair in 2 years. She did very well related to changes related to COVID--no visitors with perfume, and no flowers, so hadn't had any problems. Even now, since they are still wearing masks, she  continues to do well, no flares.  She continues to take Allegra daily; she uses the Flonase more just as needed, and is doing well.  Hasn't needed to use albuterol in a very long time. She recalls taking a puff once about 5 months ago when she wasn't feeling great (congested).  Hasn't had any flares since COVID started.  She doesn't have an active FMLA form, but hasn't needed it.  (In the past, after exposure to perfume or flowers, she needed to take a 30 minute break, albuterol prn (and avoids going back into that particular room for the rest of the shift).  Tries to avoid by switching with somebody when she sees a lot of flowers in the patient's room, before any problems.) Last spirometry was normal in 01/2019.   H/o vitamin D deficiency: She is compliant with daily supplement. Last check was 11/2013, level was 37 on the same vitamin.   Hyperlipidemia: Patient tries to follow a lowfat, low cholesterol diet. She was started on rosuvastatin last year. She is tolerating this without any side effects.  She takes the crestor at night. She is due for recheck, not fasting today.  She denies any changes in diet since last year-- Egg whites only, no red meat. Not much cheese. No mayo Eats a lot of chicken, oatmeal. Uses vinaigrette dressings on salad (doesn't eat often).  Lab Results  Component Value Date   CHOL 177 01/30/2020   HDL 52 01/30/2020   LDLCALC 105 (H) 01/30/2020  TRIG 114 01/30/2020   CHOLHDL 3.4 01/30/2020    Microscopic hematuria:  She recalls seeing a urologist many years ago (at [facility] She reports she had a cystoscopy and recalls being told everything was normal.  She has seen some blood (microscopic) ever since.  Review of chart shows trace to 1+ in the past.. CT abdomen okay in 03/2013. She denies any hematuria or any urinary complaints.  Herpes labialis--only one outbreak in the last year.  Has Valtrex to use prn.  Immunization History  Administered Date(s)  Administered   Influenza Split 10/15/2010, 11/19/2011, 10/31/2013   Influenza Whole 11/12/2012   Influenza-Unspecified 11/05/2014, 11/17/2015, 11/09/2016, 11/16/2017, 11/11/2019, 11/16/2020   PFIZER(Purple Top)SARS-COV-2 Vaccination 02/05/2019, 02/25/2019, 11/24/2019   Pneumococcal Polysaccharide-23 04/24/2013   Tdap 04/24/2013   Varicella 02/01/2020, 04/03/2020   Zoster Recombinat (Shingrix) 02/01/2020, 04/03/2020   Yearly flu shots at work [employer] employee) Last Pap smear: per GYN Dr. Corinna Capra, 03/2018. Had Exam 01/2021, plans for pap next year  Last mammogram: at Dr. Gregor Hams office, last week (no results yet). Last colonoscopy: 11/2018.  Tortuous colon, small internal hemorrhoids, no polyps.  Rec repeat 5 years (due to FHx) Last DEXA: never Dentist: twice yearly Ophtho:  Yearly Exercise: Walks around the hospital with her parents (dialysis for M, lung Ca in D). Walks some with her coworker, 2-3x/weeks (20-30 minutes) She does some stretches. No longer doing squats or jumping jacks. No longer has gym membership.   PMH, PSH, SH and FH reviewed and updated.  Parents still living with her, father with lung cancer. Mother recently started dialysis Paternal 1st cousin recently dx'd with breast cancer (age 35)  Outpatient Encounter Medications as of 02/05/2021  Medication Sig Note   Ascorbic Acid (VITAMIN C) 1000 MG tablet Take 1,000 mg by mouth daily.    cholecalciferol (VITAMIN D) 1000 UNITS tablet Take 1,000 Units by mouth daily. Reported on 07/22/2015    fexofenadine (ALLEGRA) 180 MG tablet Take 180 mg by mouth at bedtime. Reported on 07/22/2015    fish oil-omega-3 fatty acids 1000 MG capsule Take 3 g by mouth daily. Reported on 07/22/2015 02/05/2021: Takes 2/day   lisinopril-hydrochlorothiazide (ZESTORETIC) 20-12.5 MG tablet Take 1 tablet by mouth daily.    rosuvastatin (CRESTOR) 10 MG tablet TAKE 1 TABLET (10 MG TOTAL) BY MOUTH DAILY.    vitamin B-12 (CYANOCOBALAMIN) 1000 MCG tablet Take  1,000 mcg by mouth daily.    acetaminophen (TYLENOL) 500 MG tablet Take 1,000 mg by mouth every 6 (six) hours as needed.  (Patient not taking: Reported on 02/01/2020) 02/05/2021: As needed   albuterol (VENTOLIN HFA) 108 (90 Base) MCG/ACT inhaler INHALE 2 PUFFS INTO THE LUNGS EVERY 6 HOURS AS NEEDED FOR WHEEZING (Patient not taking: Reported on 08/05/2020) 02/05/2021: As needed, has only used twice this year   diclofenac (VOLTAREN) 75 MG EC tablet TAKE 1 TABLET (75 MG TOTAL) BY MOUTH 2 (TWO) TIMES DAILY. (Patient not taking: Reported on 02/05/2021) 02/05/2021: As needed   fluticasone (FLONASE) 50 MCG/ACT nasal spray Place 1 spray into both nostrils daily. (Patient not taking: Reported on 02/05/2021) 02/05/2021: Uses prn   valACYclovir (VALTREX) 1000 MG tablet TAKE 2 TABLETS AT ONSET OF COLD SORE. REPEAT ONCE 12 HOURS LATER (4 PILLS/COURSE) (Patient not taking: Reported on 08/05/2020) 02/05/2021: As needed   [DISCONTINUED] meloxicam (MOBIC) 15 MG tablet Take 1 tablet (15 mg total) by mouth daily.    No facility-administered encounter medications on file as of 02/05/2021.   Allergies  Allergen Reactions   Nexium [  Esomeprazole Magnesium] Hives, Itching and Swelling   Penicillins Hives   Percocet [Oxycodone-Acetaminophen] Itching    ROS: The patient denies anorexia, fever, headaches,  vision changes, decreased hearing, ear pain, sore throat, breast concerns, chest pain, dizziness, syncope, dyspnea on exertion, cough, swelling, nausea, vomiting, diarrhea, constipation, abdominal pain, melena, hematochezia, hematuria, incontinence, dysuria, vaginal bleeding, discharge, odor or itch, genital lesions, numbness, tingling, weakness, tremor, suspicious skin lesions, depression, anxiety, abnormal bleeding or enlarged lymph nodes. Allergies are controlled; breathing is good.  Tachycardia episodes resolved No longer having heel/achilles pain or hip pain/bursitis. No further heartburn. Pubic pain resolved      PHYSICAL EXAM:  BP 120/76    Pulse 72    Ht $R'5\' 8"'Ho$  (1.727 m)    Wt 237 lb 12.8 oz (107.9 kg)    LMP 11/07/2012    BMI 36.16 kg/m    Wt Readings from Last 3 Encounters:  02/05/21 237 lb 12.8 oz (107.9 kg)  08/05/20 236 lb 12.8 oz (107.4 kg)  02/01/20 241 lb 12.8 oz (109.7 kg)    General Appearance:     Alert, cooperative, no distress, appears stated age. She is in good spirits. She did not change into gown for exam.  Head:     Normocephalic, without obvious abnormality, atraumatic   Eyes:     PERRL, conjunctiva/corneas clear, EOM's intact, fundi benign. Ptyregium on R.  Ears:     Normal TM's and external ear canals   Nose:    Not examined, wearing mask due to COVID-19 pandemic  Throat:    Not examined, wearing mask due to COVID-19 pandemic   Neck:    Supple, no lymphadenopathy;  thyroid:  no enlargement/tenderness/nodules; no carotid bruit or JVD.   Back:     Spine nontender, no curvature, ROM normal, no CVA tenderness   Lungs:      Clear to auscultation bilaterally without wheezes, rales or ronchi; respirations unlabored   Chest Wall:     No tenderness or deformity    Heart:     Regular rate and rhythm, S1 and S2 normal, no murmur, rub or gallop, no ectopy  Breast Exam:     Deferred to GYN   Abdomen:      Soft, non-tender, nondistended, normoactive bowel sounds, no masses, no hepatosplenomegaly   Genitalia:     Deferred to GYN        Extremities:    No clubbing, cyanosis or edema.   Pulses:    2+ and symmetric all extremities   Skin:    Skin color, texture, turgor normal, no rashes or lesions. Exam is limited, not undressed.  Lymph nodes:    Cervical, supraclavicular nodes normal   Neurologic:    Normal strength, sensation and gait; reflexes 2+ and symmetric throughout                           Psych:   Normal mood, affect, hygiene and grooming   ASSESSMENT/PLAN  Annual physical exam - Plan: POCT Urinalysis DIP (Proadvantage Device), CBC with Differential/Platelet, Lipid  panel, Comprehensive metabolic panel  Essential hypertension, benign - well controlled, cont current meds - Plan: Comprehensive metabolic panel  Asthma with allergic rhinitis, unspecified asthma severity, uncomplicated - in remission. Has albuterol to use prn  Mixed hyperlipidemia - due for recheck. Cont statin and low cholesterol diet - Plan: Lipid panel  Medication monitoring encounter - Plan: CBC with Differential/Platelet, Lipid panel, Comprehensive metabolic panel  Will get  mammo from recent visit with Dr. Corinna Capra. Lipid, cbc, c-met--will return for fasting labs.  Reports she just got med refills.  Denies getting 90d supply.  Discussed monthly self breast exams and yearly mammograms; at least 30 minutes of aerobic activity at least 5 days/week, weight-bearing exercise at least 2x/week; proper sunscreen use reviewed; healthy diet, including goals of calcium and vitamin D intake and alcohol recommendations (less than or equal to 1 drink/day) reviewed; regular seatbelt use; changing batteries in smoke detectors.  Immunization recommendations discussed--continue yearly flu shots.  Recommended bivalent COVID booster, declined. Colonoscopy screening, UTD.   CPE 1 year.

## 2021-02-04 NOTE — Patient Instructions (Signed)

## 2021-02-05 ENCOUNTER — Other Ambulatory Visit: Payer: Self-pay

## 2021-02-05 ENCOUNTER — Ambulatory Visit (INDEPENDENT_AMBULATORY_CARE_PROVIDER_SITE_OTHER): Payer: 59 | Admitting: Family Medicine

## 2021-02-05 ENCOUNTER — Encounter: Payer: Self-pay | Admitting: Family Medicine

## 2021-02-05 VITALS — BP 120/76 | HR 72 | Ht 68.0 in | Wt 237.8 lb

## 2021-02-05 DIAGNOSIS — E782 Mixed hyperlipidemia: Secondary | ICD-10-CM

## 2021-02-05 DIAGNOSIS — J45909 Unspecified asthma, uncomplicated: Secondary | ICD-10-CM

## 2021-02-05 DIAGNOSIS — Z0000 Encounter for general adult medical examination without abnormal findings: Secondary | ICD-10-CM

## 2021-02-05 DIAGNOSIS — Z5181 Encounter for therapeutic drug level monitoring: Secondary | ICD-10-CM | POA: Diagnosis not present

## 2021-02-05 DIAGNOSIS — I10 Essential (primary) hypertension: Secondary | ICD-10-CM

## 2021-02-05 LAB — POCT URINALYSIS DIP (PROADVANTAGE DEVICE)
Bilirubin, UA: NEGATIVE
Glucose, UA: NEGATIVE mg/dL
Ketones, POC UA: NEGATIVE mg/dL
Leukocytes, UA: NEGATIVE
Nitrite, UA: NEGATIVE
Specific Gravity, Urine: 1.025
Urobilinogen, Ur: NEGATIVE
pH, UA: 6 (ref 5.0–8.0)

## 2021-02-11 ENCOUNTER — Telehealth: Payer: Self-pay | Admitting: Family Medicine

## 2021-02-11 ENCOUNTER — Other Ambulatory Visit (HOSPITAL_COMMUNITY)
Admission: AD | Admit: 2021-02-11 | Discharge: 2021-02-11 | Disposition: A | Discharge status: Home / Self Care | Payer: 59 | Source: Ambulatory Visit | Attending: Family Medicine | Admitting: Family Medicine

## 2021-02-11 DIAGNOSIS — Z79899 Other long term (current) drug therapy: Secondary | ICD-10-CM | POA: Insufficient documentation

## 2021-02-11 DIAGNOSIS — E785 Hyperlipidemia, unspecified: Secondary | ICD-10-CM | POA: Diagnosis not present

## 2021-02-11 DIAGNOSIS — J45909 Unspecified asthma, uncomplicated: Secondary | ICD-10-CM | POA: Insufficient documentation

## 2021-02-11 DIAGNOSIS — I10 Essential (primary) hypertension: Secondary | ICD-10-CM | POA: Insufficient documentation

## 2021-02-11 DIAGNOSIS — Z5181 Encounter for therapeutic drug level monitoring: Secondary | ICD-10-CM | POA: Diagnosis not present

## 2021-02-11 DIAGNOSIS — Z0000 Encounter for general adult medical examination without abnormal findings: Secondary | ICD-10-CM | POA: Diagnosis not present

## 2021-02-11 LAB — LIPID PANEL
Cholesterol: 188 mg/dL (ref 0–200)
HDL: 52 mg/dL (ref >40)
LDL Cholesterol: 97 mg/dL (ref 0–99)
Total CHOL/HDL Ratio: 3.6 RATIO
Triglycerides: 193 mg/dL — ABNORMAL HIGH (ref <150)
VLDL: 39 mg/dL (ref 0–40)

## 2021-02-11 LAB — COMPREHENSIVE METABOLIC PANEL
ALT: 18 U/L (ref 0–44)
AST: 25 U/L (ref 15–41)
Albumin: 3.8 g/dL (ref 3.5–5.0)
Alkaline Phosphatase: 49 U/L (ref 38–126)
Anion gap: 8 (ref 5–15)
BUN: 20 mg/dL (ref 6–20)
CO2: 25 mmol/L (ref 22–32)
Calcium: 9.5 mg/dL (ref 8.9–10.3)
Chloride: 104 mmol/L (ref 98–111)
Creatinine, Ser: 1.1 mg/dL — ABNORMAL HIGH (ref 0.44–1.00)
GFR, Estimated: >60 mL/min (ref >60)
Glucose, Bld: 96 mg/dL (ref 70–99)
Potassium: 3.8 mmol/L (ref 3.5–5.1)
Sodium: 137 mmol/L (ref 135–145)
Total Bilirubin: 0.7 mg/dL (ref 0.3–1.2)
Total Protein: 7.5 g/dL (ref 6.5–8.1)

## 2021-02-11 LAB — CBC
HCT: 38.3 % (ref 36.0–46.0)
Hemoglobin: 12.7 g/dL (ref 12.0–15.0)
MCH: 29.3 pg (ref 26.0–34.0)
MCHC: 33.2 g/dL (ref 30.0–36.0)
MCV: 88.5 fL (ref 80.0–100.0)
Platelets: 150 10*3/uL (ref 150–400)
RBC: 4.33 MIL/uL (ref 3.87–5.11)
RDW: 13 % (ref 11.5–15.5)
WBC: 4.4 10*3/uL (ref 4.0–10.5)
nRBC: 0 % (ref 0.0–0.2)

## 2021-02-11 NOTE — Telephone Encounter (Signed)
Pt called and wanted to let you know she cancelled her lab appt she had for tomorrow because she had them done today at work.

## 2021-02-12 ENCOUNTER — Other Ambulatory Visit: Payer: 59

## 2021-03-17 ENCOUNTER — Telehealth: Payer: Self-pay

## 2021-03-17 ENCOUNTER — Other Ambulatory Visit: Payer: Self-pay | Admitting: Medical

## 2021-03-17 ENCOUNTER — Other Ambulatory Visit (HOSPITAL_COMMUNITY): Payer: Self-pay

## 2021-03-17 DIAGNOSIS — H52223 Regular astigmatism, bilateral: Secondary | ICD-10-CM | POA: Diagnosis not present

## 2021-03-17 DIAGNOSIS — I10 Essential (primary) hypertension: Secondary | ICD-10-CM

## 2021-03-17 DIAGNOSIS — H5203 Hypermetropia, bilateral: Secondary | ICD-10-CM | POA: Diagnosis not present

## 2021-03-17 DIAGNOSIS — H11051 Peripheral pterygium, progressive, right eye: Secondary | ICD-10-CM | POA: Diagnosis not present

## 2021-03-17 DIAGNOSIS — H524 Presbyopia: Secondary | ICD-10-CM | POA: Diagnosis not present

## 2021-03-17 DIAGNOSIS — H0288A Meibomian gland dysfunction right eye, upper and lower eyelids: Secondary | ICD-10-CM | POA: Diagnosis not present

## 2021-03-17 DIAGNOSIS — H0288B Meibomian gland dysfunction left eye, upper and lower eyelids: Secondary | ICD-10-CM | POA: Diagnosis not present

## 2021-03-17 DIAGNOSIS — H40012 Open angle with borderline findings, low risk, left eye: Secondary | ICD-10-CM | POA: Diagnosis not present

## 2021-03-17 MED ORDER — LISINOPRIL-HYDROCHLOROTHIAZIDE 20-12.5 MG PO TABS
1.0000 | ORAL_TABLET | Freq: Every day | ORAL | 0 refills | Status: DC
  Filled 2021-03-17 – 2021-03-26 (×3): qty 90, 90d supply, fill #0

## 2021-03-17 NOTE — Telephone Encounter (Signed)
done

## 2021-03-17 NOTE — Telephone Encounter (Signed)
Already done earlier today

## 2021-03-17 NOTE — Telephone Encounter (Signed)
Pt needs refill BP med lisinopril-hydrochlorothiazide to [facility] for 90 days she needs today because she is going out of town tomorrow.

## 2021-03-18 ENCOUNTER — Other Ambulatory Visit (HOSPITAL_COMMUNITY): Payer: Self-pay

## 2021-03-26 ENCOUNTER — Other Ambulatory Visit (HOSPITAL_COMMUNITY): Payer: Self-pay

## 2021-03-26 ENCOUNTER — Other Ambulatory Visit: Payer: Self-pay | Admitting: Family Medicine

## 2021-03-26 DIAGNOSIS — E782 Mixed hyperlipidemia: Secondary | ICD-10-CM

## 2021-03-26 MED ORDER — ROSUVASTATIN CALCIUM 10 MG PO TABS
10.0000 mg | ORAL_TABLET | Freq: Every day | ORAL | 2 refills | Status: DC
  Filled 2021-03-26: qty 90, fill #0
  Filled 2021-03-27 – 2021-07-30 (×2): qty 90, 90d supply, fill #0
  Filled 2021-12-31: qty 90, 90d supply, fill #1
  Filled 2022-03-24: qty 90, 90d supply, fill #2

## 2021-03-27 ENCOUNTER — Other Ambulatory Visit (HOSPITAL_COMMUNITY): Payer: Self-pay

## 2021-06-16 ENCOUNTER — Telehealth: Payer: Self-pay | Admitting: Family Medicine

## 2021-06-16 NOTE — Telephone Encounter (Signed)
Pt called and states that she needs a Covid PCR test for travel. She needs results by 06/27/2021. Must be a recent test. Please advise if ok and place orders. Pt can be reached at [phone]. ?

## 2021-06-16 NOTE — Telephone Encounter (Signed)
I thought we weren't doing these for travel.  I don't think insurance will cover.  Check with Beverlee Nims regarding this please, as we all need to be clear ?

## 2021-06-16 NOTE — Telephone Encounter (Signed)
Candace Walker,  ?Please advise. ?

## 2021-06-17 ENCOUNTER — Encounter: Payer: Self-pay | Admitting: Family Medicine

## 2021-06-18 NOTE — Telephone Encounter (Signed)
Please advise if insurance will cover this PCR? ?

## 2021-06-18 NOTE — Telephone Encounter (Signed)
I reviewed this yesterday and gave to [facility] to check on.  I left office sick, so I did not obtain the response.  Will do today. ?

## 2021-06-19 NOTE — Telephone Encounter (Signed)
Candace Walker contacted Candace Walker and advised she will need to verify coverage with her carrier. ?

## 2021-06-20 ENCOUNTER — Encounter: Payer: Self-pay | Admitting: Orthopaedic Surgery

## 2021-06-20 ENCOUNTER — Ambulatory Visit (INDEPENDENT_AMBULATORY_CARE_PROVIDER_SITE_OTHER): Payer: 59 | Admitting: Orthopaedic Surgery

## 2021-06-20 ENCOUNTER — Ambulatory Visit (INDEPENDENT_AMBULATORY_CARE_PROVIDER_SITE_OTHER): Payer: 59

## 2021-06-20 ENCOUNTER — Other Ambulatory Visit (HOSPITAL_COMMUNITY): Payer: Self-pay

## 2021-06-20 VITALS — Ht 69.0 in | Wt 235.0 lb

## 2021-06-20 DIAGNOSIS — M7661 Achilles tendinitis, right leg: Secondary | ICD-10-CM

## 2021-06-20 MED ORDER — DICLOFENAC SODIUM 75 MG PO TBEC
75.0000 mg | DELAYED_RELEASE_TABLET | Freq: Two times a day (BID) | ORAL | 2 refills | Status: DC
  Filled 2021-06-20: qty 30, 15d supply, fill #0
  Filled 2021-07-30: qty 30, 15d supply, fill #1
  Filled 2021-09-23: qty 30, 15d supply, fill #2

## 2021-06-20 MED ORDER — NITROGLYCERIN 0.2 MG/HR TD PT24
MEDICATED_PATCH | TRANSDERMAL | 12 refills | Status: DC
  Filled 2021-06-20: qty 5, 20d supply, fill #0
  Filled 2021-09-23: qty 5, 20d supply, fill #1
  Filled 2021-12-31: qty 5, 20d supply, fill #2
  Filled 2022-02-23: qty 5, 20d supply, fill #3
  Filled 2022-03-24: qty 5, 20d supply, fill #4
  Filled 2022-04-20: qty 5, 20d supply, fill #5

## 2021-06-20 NOTE — Progress Notes (Signed)
? ?Office Visit Note ?  ?Patient: Candace Walker           ?Date of Birth: December 23, 1970           ?MRN: [id number] ?Visit Date: 06/20/2021 ?             ?Requested by: Rita Ohara, MD ?[address] ?[place],  [place] [address] ?PCP: Rita Ohara, MD ? ? ?Assessment & Plan: ?Visit Diagnoses:  ?1. Right Achilles tendinitis   ? ? ?Plan: Impression is chronic right Achilles tendinosis.  Large enthesophyte on x-ray consistent with this finding.  Treatment options were reviewed and she would like to try a heel lift and nitroglycerin patch as well as physical therapy for phonophoresis.  She declined a cam boot.  We briefly had a discussion on the surgical treatment as well.  Follow-up as needed. ? ?Follow-Up Instructions: No follow-ups on file.  ? ?Orders:  ?Orders Placed This Encounter  ?Procedures  ? XR Os Calcis Right  ? ?No orders of the defined types were placed in this encounter. ? ? ? ? Procedures: ?No procedures performed ? ? ?Clinical Data: ?No additional findings. ? ? ?Subjective: ?Chief Complaint  ?Patient presents with  ? Right Foot - Pain  ? ? ?HPI ? ?Candace Walker is a 51 year old female here for posterior right foot pain for years.  Denies any injuries.  Hurts almost all the time with walking on the backside of the heel.  Works at [employer].  Swells at times.  Takes Tylenol for the pain. ? ?Review of Systems  ?Constitutional: Negative.   ?HENT: Negative.    ?Eyes: Negative.   ?Respiratory: Negative.    ?Cardiovascular: Negative.   ?Endocrine: Negative.   ?Musculoskeletal: Negative.   ?Neurological: Negative.   ?Hematological: Negative.   ?Psychiatric/Behavioral: Negative.    ?All other systems reviewed and are negative. ? ? ?Objective: ?Vital Signs: Ht '5\' 9"'$  (1.753 m)   Wt 235 lb (106.6 kg)   LMP 11/07/2012   BMI 34.70 kg/m?  ? ?Physical Exam ?Vitals and nursing note reviewed.  ?Constitutional:   ?   Appearance: She is well-developed.  ?Pulmonary:  ?   Effort: Pulmonary effort is normal.  ?Skin: ?    General: Skin is warm.  ?   Capillary Refill: Capillary refill takes less than 2 seconds.  ?Neurological:  ?   Mental Status: She is alert and oriented to person, place, and time.  ?Psychiatric:     ?   Behavior: Behavior normal.     ?   Thought Content: Thought content normal.     ?   Judgment: Judgment normal.  ? ? ?Ortho Exam ? ?Examination of the right heel shows tenderness to the insertion of the Achilles.  Achilles excursion is normal.  Normal range of motion of the subtalar joint ankle joint.  Plantar fascia is nontender.  Posterior tibial tendon nontender. ? ?Specialty Comments:  ?No specialty comments available. ? ?Imaging: ?No results found. ? ? ?PMFS History: ?Patient Active Problem List  ? Diagnosis Date Noted  ? Right Achilles tendinitis 06/20/2021  ? Peroneal tendinitis, right leg 04/26/2018  ? Right knee pain 09/09/2017  ? Plantar fasciitis of left foot 09/09/2017  ? S/P laparoscopic assisted vaginal hysterectomy (LAVH) 06/12/2013  ? Obesity (BMI 30-39.9) 04/25/2013  ? Vitamin D deficiency   ? Mixed hyperlipidemia   ? Essential hypertension, benign   ? Asthma with allergic rhinitis   ? Allergic rhinitis   ? ?Past Medical History:  ?Diagnosis  Date  ? Allergic rhinitis, cause unspecified   ? Allergy   ? Arthritis   ? bursitis in hips, recently dx with avascular necrosis femor head  ? Asthma with allergic rhinitis   ? rarely uses inhaler unless she gets sick  ? Essential hypertension, benign   ? Heart murmur   ? Mixed hyperlipidemia   ? no meds, diet controlled  ? Positive PPD   ? h/o BCG vaccine, negative chest xray  ? SVD (spontaneous vaginal delivery)   ? x 2  ? Unspecified vitamin D deficiency   ?  ?Family History  ?Problem Relation Age of Onset  ? Diabetes Mother   ? Hypertension Mother   ? Allergies Mother   ? Colon polyps Mother 3  ? Kidney disease Mother   ?     on transplant list; started dialysis 2022  ? Heart disease Father 69  ?     MI  ? Hyperlipidemia Father   ? Hypertension Father   ?  Lung cancer Father 76  ?     smoker  ? Hypertension Sister   ? Diabetes Sister   ?     borderline  ? Colon polyps Sister   ? Rheum arthritis Maternal Grandmother   ? Breast cancer Paternal Grandmother   ? Rheum arthritis Paternal Grandmother   ? Breast cancer Cousin 68  ? Colon cancer Neg Hx   ? Esophageal cancer Neg Hx   ? Liver disease Neg Hx   ? Stomach cancer Neg Hx   ? Rectal cancer Neg Hx   ?  ?Past Surgical History:  ?Procedure Laterality Date  ? ENDOMETRIAL ABLATION  2007  ? Dr. Corinna Capra  ? LAPAROSCOPIC ASSISTED VAGINAL HYSTERECTOMY N/A 06/12/2013  ? Procedure: LAPAROSCOPIC ASSISTED VAGINAL HYSTERECTOMY;  Surgeon: Luz Lex, MD;  Location: [facility];  Service: Gynecology;  Laterality: N/A;  ? LAPAROSCOPIC BILATERAL SALPINGECTOMY  06/12/2013  ? Procedure: LAPAROSCOPIC BILATERAL SALPINGECTOMY;  Surgeon: Luz Lex, MD;  Location: [facility];  Service: Gynecology;;  ? TUBAL LIGATION    ? WISDOM TOOTH EXTRACTION    ? right bottom lower tooth  ? ?Social History  ? ?Occupational History  ? Occupation: Chartered certified accountant  ?  Employer: [employer]  ?Tobacco Use  ? Smoking status: Never  ? Smokeless tobacco: Never  ?Vaping Use  ? Vaping Use: Never used  ?Substance and Sexual Activity  ? Alcohol use: Yes  ?  Comment: occasional (if goes out with friends, not even weekly)  ? Drug use: No  ? Sexual activity: Not Currently  ?  Birth control/protection: Surgical  ?  Comment: ablation and tubal ligation and hysterectomy  ? ? ? ? ? ? ?

## 2021-06-23 ENCOUNTER — Other Ambulatory Visit: Payer: Self-pay | Admitting: *Deleted

## 2021-06-23 ENCOUNTER — Other Ambulatory Visit (HOSPITAL_COMMUNITY): Payer: Self-pay

## 2021-06-23 ENCOUNTER — Other Ambulatory Visit: Payer: Self-pay | Admitting: Family Medicine

## 2021-06-23 DIAGNOSIS — I10 Essential (primary) hypertension: Secondary | ICD-10-CM

## 2021-06-23 MED ORDER — LISINOPRIL-HYDROCHLOROTHIAZIDE 20-12.5 MG PO TABS
1.0000 | ORAL_TABLET | Freq: Every day | ORAL | 0 refills | Status: DC
  Filled 2021-06-23: qty 90, 90d supply, fill #0

## 2021-06-23 MED ORDER — LISINOPRIL-HYDROCHLOROTHIAZIDE 20-12.5 MG PO TABS
1.0000 | ORAL_TABLET | Freq: Every day | ORAL | 1 refills | Status: DC
  Filled 2021-06-23 – 2021-09-23 (×2): qty 90, 90d supply, fill #0
  Filled 2021-12-31: qty 90, 90d supply, fill #1

## 2021-06-25 ENCOUNTER — Other Ambulatory Visit: Payer: 59

## 2021-06-25 DIAGNOSIS — Z7184 Encounter for health counseling related to travel: Secondary | ICD-10-CM | POA: Diagnosis not present

## 2021-06-26 LAB — NOVEL CORONAVIRUS, NAA: SARS-CoV-2, NAA: NOT DETECTED

## 2021-07-30 ENCOUNTER — Other Ambulatory Visit (HOSPITAL_COMMUNITY): Payer: Self-pay

## 2021-09-23 ENCOUNTER — Other Ambulatory Visit (HOSPITAL_COMMUNITY): Payer: Self-pay

## 2021-09-23 ENCOUNTER — Other Ambulatory Visit: Payer: Self-pay | Admitting: Family Medicine

## 2021-09-23 DIAGNOSIS — J45909 Unspecified asthma, uncomplicated: Secondary | ICD-10-CM

## 2021-10-22 ENCOUNTER — Encounter: Payer: Self-pay | Admitting: Internal Medicine

## 2021-11-25 ENCOUNTER — Encounter: Payer: Self-pay | Admitting: Internal Medicine

## 2021-12-31 ENCOUNTER — Other Ambulatory Visit (HOSPITAL_COMMUNITY): Payer: Self-pay

## 2022-01-15 ENCOUNTER — Other Ambulatory Visit (HOSPITAL_COMMUNITY): Payer: Self-pay

## 2022-01-15 ENCOUNTER — Ambulatory Visit: Payer: Self-pay

## 2022-01-15 ENCOUNTER — Encounter: Payer: Self-pay | Admitting: Surgery

## 2022-01-15 ENCOUNTER — Ambulatory Visit (INDEPENDENT_AMBULATORY_CARE_PROVIDER_SITE_OTHER): Payer: 59 | Admitting: Surgery

## 2022-01-15 VITALS — BP 129/74 | HR 67 | Ht 69.0 in | Wt 237.0 lb

## 2022-01-15 DIAGNOSIS — M7541 Impingement syndrome of right shoulder: Secondary | ICD-10-CM

## 2022-01-15 DIAGNOSIS — M25511 Pain in right shoulder: Secondary | ICD-10-CM

## 2022-01-15 MED ORDER — IBUPROFEN 800 MG PO TABS
800.0000 mg | ORAL_TABLET | Freq: Three times a day (TID) | ORAL | 0 refills | Status: DC | PRN
  Filled 2022-01-15: qty 50, 17d supply, fill #0

## 2022-01-15 MED ORDER — METHOCARBAMOL 500 MG PO TABS
500.0000 mg | ORAL_TABLET | Freq: Three times a day (TID) | ORAL | 0 refills | Status: DC | PRN
  Filled 2022-01-15: qty 50, 17d supply, fill #0

## 2022-01-15 NOTE — Progress Notes (Signed)
Office Visit Note   Patient: Candace Walker           Date of Birth: 13-Dec-1970           MRN: [id number] Visit Date: 01/15/2022              Requested by: Rita Ohara, [address] [place],  [place] [address] PCP: Rita Ohara, MD   Assessment & Plan: Visit Diagnoses:  1. Acute pain of right shoulder   2. Impingement syndrome of right shoulder     Plan: Since patient's pain started this morning electing conservative treatment.  I sent in prescriptions for ibuprofen 800 mg every 8 hours as needed with food.  Also sent in Robaxin 500 mg every 8 hours as needed for spasm.  Follow-up in a couple weeks for recheck.  If shoulder pain continues I may consider doing a subacromial Marcaine/Depo-Medrol injection.  Follow-Up Instructions: Return in about 2 weeks (around 01/29/2022) for with Cledith Kamiya for recheck right shoulder.   Orders:  Orders Placed This Encounter  Procedures   XR Shoulder Right   No orders of the defined types were placed in this encounter.     Procedures: No procedures performed   Clinical Data: No additional findings.   Subjective: Chief Complaint  Patient presents with   Right Shoulder - Pain    HPI 51 year old black female comes in today with complaints of right shoulder and scapular pain.  States that pain started this morning.  She has taken a dose of ibuprofen without much improvement.  No injury.  No complaints of neck pain or pain radiating further down her arm.  When she raises her arm overhead she has pain in the shoulder but also into the right shoulder blade.  No left-sided symptoms.  No complaints of upper extremity numbness tingling.  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 Review of Systems No current planes of cardiopulmonary GI/GU issues  Objective: Vital Signs: BP 129/74 (BP Location: Right Arm, Patient Position: Sitting, Cuff Size: Large)   Pulse 67   Ht '5\' 9"'$  (1.753 m)   Wt 237 lb (107.5 kg)   LMP 11/07/2012   BMI  35.00 kg/m   Physical Exam HENT:     Head: Normocephalic.     Nose: Nose normal.  Eyes:     Extraocular Movements: Extraocular movements intact.  Pulmonary:     Effort: No respiratory distress.  Musculoskeletal:     Comments: Gait is normal.  Cervical spine good range of motion.  Mild right brachial plexus tenderness.  Negative on the left side.  Mild tenderness along the medial scapular border on the right.  Mild to moderate right shoulder impingement test.  No focal deficits.  Negative drop arm test.  Some right shoulder pain with supraspinatus resistance.  Neurological:     Mental Status: She is alert and oriented to person, place, and time.  Psychiatric:        Mood and Affect: Mood normal.     Ortho Exam  Specialty Comments:  No specialty comments available.  Imaging: No results found.   PMFS History: Patient Active Problem List   Diagnosis Date Noted   Right Achilles tendinitis 06/20/2021   Peroneal tendinitis, right leg 04/26/2018   Right knee pain 09/09/2017   Plantar fasciitis of left foot 09/09/2017   S/P laparoscopic assisted vaginal hysterectomy (LAVH) 06/12/2013   Obesity (BMI 30-39.9) 04/25/2013   Vitamin D deficiency    Mixed hyperlipidemia  Essential hypertension, benign    Asthma with allergic rhinitis    Allergic rhinitis    Past Medical History:  Diagnosis Date   Allergic rhinitis, cause unspecified    Allergy    Arthritis    bursitis in hips, recently dx with avascular necrosis femor head   Asthma with allergic rhinitis    rarely uses inhaler unless she gets sick   Essential hypertension, benign    Heart murmur    Mixed hyperlipidemia    no meds, diet controlled   Positive PPD    h/o BCG vaccine, negative chest xray   SVD (spontaneous vaginal delivery)    x 2   Unspecified vitamin D deficiency     Family History  Problem Relation Age of Onset   Diabetes Mother    Hypertension Mother    Allergies Mother    Colon polyps Mother 6    Kidney disease Mother        on transplant list; started dialysis 2022   Heart disease Father 76       MI   Hyperlipidemia Father    Hypertension Father    Lung cancer Father 63       smoker   Hypertension Sister    Diabetes Sister        borderline   Colon polyps Sister    Rheum arthritis Maternal Grandmother    Breast cancer Paternal Grandmother    Rheum arthritis Paternal Grandmother    Breast cancer Cousin 85   Colon cancer Neg Hx    Esophageal cancer Neg Hx    Liver disease Neg Hx    Stomach cancer Neg Hx    Rectal cancer Neg Hx     Past Surgical History:  Procedure Laterality Date   ENDOMETRIAL ABLATION  2007   Dr. Corinna Capra   LAPAROSCOPIC ASSISTED VAGINAL HYSTERECTOMY N/A 06/12/2013   Procedure: LAPAROSCOPIC ASSISTED VAGINAL HYSTERECTOMY;  Surgeon: Luz Lex, MD;  Location: [facility];  Service: Gynecology;  Laterality: N/A;   LAPAROSCOPIC BILATERAL SALPINGECTOMY  06/12/2013   Procedure: LAPAROSCOPIC BILATERAL SALPINGECTOMY;  Surgeon: Luz Lex, MD;  Location: [facility];  Service: Gynecology;;   TUBAL LIGATION     WISDOM TOOTH EXTRACTION     right bottom lower tooth   Social History   Occupational History   Occupation: Research scientist (life sciences): [employer]  Tobacco Use   Smoking status: Never   Smokeless tobacco: Never  Vaping Use   Vaping Use: Never used  Substance and Sexual Activity   Alcohol use: Yes    Comment: occasional (if goes out with friends, not even weekly)   Drug use: No   Sexual activity: Not Currently    Birth control/protection: Surgical    Comment: ablation and tubal ligation and hysterectomy

## 2022-01-19 ENCOUNTER — Other Ambulatory Visit (HOSPITAL_COMMUNITY): Payer: Self-pay

## 2022-01-19 MED ORDER — AZITHROMYCIN 250 MG PO TABS
ORAL_TABLET | ORAL | 1 refills | Status: DC
  Filled 2022-01-19: qty 6, 5d supply, fill #0
  Filled 2022-01-23 (×2): qty 6, 5d supply, fill #1

## 2022-01-23 ENCOUNTER — Other Ambulatory Visit: Payer: Self-pay

## 2022-01-23 ENCOUNTER — Other Ambulatory Visit (HOSPITAL_COMMUNITY): Payer: Self-pay

## 2022-01-27 ENCOUNTER — Telehealth: Payer: Self-pay | Admitting: Orthopaedic Surgery

## 2022-01-27 ENCOUNTER — Telehealth: Payer: Self-pay | Admitting: Family Medicine

## 2022-01-27 ENCOUNTER — Other Ambulatory Visit: Payer: Self-pay | Admitting: Physician Assistant

## 2022-01-27 ENCOUNTER — Other Ambulatory Visit (HOSPITAL_COMMUNITY): Payer: Self-pay

## 2022-01-27 DIAGNOSIS — E559 Vitamin D deficiency, unspecified: Secondary | ICD-10-CM

## 2022-01-27 DIAGNOSIS — Z0000 Encounter for general adult medical examination without abnormal findings: Secondary | ICD-10-CM

## 2022-01-27 DIAGNOSIS — Z5181 Encounter for therapeutic drug level monitoring: Secondary | ICD-10-CM

## 2022-01-27 DIAGNOSIS — I10 Essential (primary) hypertension: Secondary | ICD-10-CM

## 2022-01-27 DIAGNOSIS — E782 Mixed hyperlipidemia: Secondary | ICD-10-CM

## 2022-01-27 MED ORDER — DICLOFENAC SODIUM 75 MG PO TBEC
75.0000 mg | DELAYED_RELEASE_TABLET | Freq: Two times a day (BID) | ORAL | 2 refills | Status: DC
  Filled 2022-01-27: qty 30, 15d supply, fill #0
  Filled 2022-03-02: qty 30, 15d supply, fill #1
  Filled 2022-03-24: qty 30, 15d supply, fill #2

## 2022-01-27 NOTE — Telephone Encounter (Signed)
Orders entered. Okay to schedule fasting lab visit.

## 2022-01-27 NOTE — Telephone Encounter (Addendum)
Patient called needing Rx refilled Diclofenac 75 mg. Patient uses [facility]. The number to contact patient is [phone]

## 2022-01-27 NOTE — Telephone Encounter (Signed)
Pt called and wants to come do her blood work on 12/22 before her physical on 12/27.

## 2022-01-27 NOTE — Telephone Encounter (Signed)
Sent in

## 2022-01-28 ENCOUNTER — Ambulatory Visit: Payer: 59 | Admitting: Surgery

## 2022-01-29 NOTE — Telephone Encounter (Signed)
Pt scheduled for lab work 12/22

## 2022-01-30 ENCOUNTER — Ambulatory Visit: Payer: 59 | Admitting: Surgery

## 2022-02-04 ENCOUNTER — Ambulatory Visit: Payer: 59 | Admitting: Surgery

## 2022-02-06 ENCOUNTER — Other Ambulatory Visit: Payer: 59

## 2022-02-06 DIAGNOSIS — Z5181 Encounter for therapeutic drug level monitoring: Secondary | ICD-10-CM

## 2022-02-06 DIAGNOSIS — I10 Essential (primary) hypertension: Secondary | ICD-10-CM | POA: Diagnosis not present

## 2022-02-06 DIAGNOSIS — E782 Mixed hyperlipidemia: Secondary | ICD-10-CM | POA: Diagnosis not present

## 2022-02-06 DIAGNOSIS — Z0000 Encounter for general adult medical examination without abnormal findings: Secondary | ICD-10-CM | POA: Diagnosis not present

## 2022-02-06 DIAGNOSIS — E559 Vitamin D deficiency, unspecified: Secondary | ICD-10-CM

## 2022-02-06 LAB — LIPID PANEL

## 2022-02-07 LAB — LIPID PANEL
Chol/HDL Ratio: 3.6 ratio (ref 0.0–4.4)
Cholesterol, Total: 192 mg/dL (ref 100–199)
HDL: 53 mg/dL (ref >39)
LDL Chol Calc (NIH): 114 mg/dL — ABNORMAL HIGH (ref 0–99)
Triglycerides: 140 mg/dL (ref 0–149)
VLDL Cholesterol Cal: 25 mg/dL (ref 5–40)

## 2022-02-07 LAB — COMPREHENSIVE METABOLIC PANEL
ALT: 14 IU/L (ref 0–32)
AST: 22 IU/L (ref 0–40)
Albumin/Globulin Ratio: 1.4 (ref 1.2–2.2)
Albumin: 4.6 g/dL (ref 3.8–4.9)
Alkaline Phosphatase: 59 IU/L (ref 44–121)
BUN/Creatinine Ratio: 12 (ref 9–23)
BUN: 13 mg/dL (ref 6–24)
Bilirubin Total: 0.4 mg/dL (ref 0.0–1.2)
CO2: 23 mmol/L (ref 20–29)
Calcium: 10.1 mg/dL (ref 8.7–10.2)
Chloride: 103 mmol/L (ref 96–106)
Creatinine, Ser: 1.11 mg/dL — ABNORMAL HIGH (ref 0.57–1.00)
Globulin, Total: 3.2 g/dL (ref 1.5–4.5)
Glucose: 91 mg/dL (ref 70–99)
Potassium: 3.8 mmol/L (ref 3.5–5.2)
Sodium: 141 mmol/L (ref 134–144)
Total Protein: 7.8 g/dL (ref 6.0–8.5)
eGFR: 60 mL/min/{1.73_m2} (ref >59)

## 2022-02-07 LAB — CBC WITH DIFFERENTIAL/PLATELET
Basophils Absolute: 0 10*3/uL (ref 0.0–0.2)
Basos: 0 %
EOS (ABSOLUTE): 0 10*3/uL (ref 0.0–0.4)
Eos: 0 %
Hematocrit: 39.5 % (ref 34.0–46.6)
Hemoglobin: 12.8 g/dL (ref 11.1–15.9)
Immature Grans (Abs): 0 10*3/uL (ref 0.0–0.1)
Immature Granulocytes: 0 %
Lymphocytes Absolute: 1.8 10*3/uL (ref 0.7–3.1)
Lymphs: 58 %
MCH: 28.7 pg (ref 26.6–33.0)
MCHC: 32.4 g/dL (ref 31.5–35.7)
MCV: 89 fL (ref 79–97)
Monocytes Absolute: 0.2 10*3/uL (ref 0.1–0.9)
Monocytes: 6 %
Neutrophils Absolute: 1.1 10*3/uL — ABNORMAL LOW (ref 1.4–7.0)
Neutrophils: 36 %
Platelets: 146 10*3/uL — ABNORMAL LOW (ref 150–450)
RBC: 4.46 x10E6/uL (ref 3.77–5.28)
RDW: 13.2 % (ref 11.7–15.4)
WBC: 3.1 10*3/uL — ABNORMAL LOW (ref 3.4–10.8)

## 2022-02-07 LAB — VITAMIN D 25 HYDROXY (VIT D DEFICIENCY, FRACTURES): Vit D, 25-Hydroxy: 63.5 ng/mL (ref 30.0–100.0)

## 2022-02-10 NOTE — Patient Instructions (Incomplete)

## 2022-02-10 NOTE — Progress Notes (Deleted)
No chief complaint on file.   Candace Walker is a 51 y.o. female who presents for a complete physical.   In the last year she has seen ortho for R achilles tendinitis (06/2021), and R shoulder impingement (12/2021), treated with ibuprofen and robaxin.  Had diclofenac refilled in December ??ENSURE NOT TAKING BOTH  ??Z-pak rx 12/4 dispensed, not sure who rx'd.  Hypertension follow-up:  She is compliant with taking lisinopril HCTZ and denies side effects.  Blood pressures are running  Denies dizziness, headaches, chest pain, palpitations or edema.  She wears compression stockings at work.  BP Readings from Last 3 Encounters:  01/15/22 129/74  02/05/21 120/76  08/05/20 114/70    She has h/o palpitations, episodes of tachycardia. She saw Dr. Audie Box in 08/2019, echo showed normal EF, grade II diastolic dysfunction, otherwise okay. She never got the Zio patch, and hasn't had any recurrent issues with palpitations or tachycardia.  Allergies and asthma:  She hasn't taken any singulair in 3 years. She did very well related to changes related to COVID--no visitors with perfume, and no flowers, so hadn't had any problems. Even now, she continues to do well, no flares.  She continues to take Allegra daily, Flonase as needed, and is doing well.   She last needed albuterol Hasn't had any asthma flares since COVID started (no longer needs FMLA forms related to this issue).  (In the past, after exposure to perfume or flowers, she needed to take a 30 minute break, albuterol prn (and avoids going back into that particular room for the rest of the shift).  Tries to avoid by switching with somebody when she sees a lot of flowers in the patient's room, before any problems.) Last spirometry was normal in 01/2019.   H/o vitamin D deficiency: She is compliant with daily supplement. Last check was 11/2013, level was 37 on the same vitamin.   Hyperlipidemia: Patient tries to follow a lowfat, low  cholesterol diet. She is compliant with taking rosuvastatin at night, denies any side effects. She denies any changes in diet since last year-- Egg whites only, no red meat. Not much cheese. No mayo Eats a lot of chicken, oatmeal. Uses vinaigrette dressings on salad (doesn't eat often).  Microscopic hematuria:  She recalls seeing a urologist many years ago (at [facility] She reports she had a cystoscopy and recalls being told everything was normal.  She has seen some blood (microscopic) ever since.  Review of chart shows trace to 1+ in the past.. CT abdomen okay in 03/2013. She denies any hematuria or any urinary complaints.  Herpes labialis--infrequent outbreaks. ?Any valtrex left?  Immunization History  Administered Date(s) Administered   Influenza Split 10/15/2010, 11/19/2011, 10/31/2013   Influenza Whole 11/12/2012   Influenza-Unspecified 11/05/2014, 11/17/2015, 11/09/2016, 11/16/2017, 11/11/2019, 11/16/2020   PFIZER(Purple Top)SARS-COV-2 Vaccination 02/05/2019, 02/25/2019, 11/24/2019   Pneumococcal Polysaccharide-23 04/24/2013   Tdap 04/24/2013   Zoster Recombinat (Shingrix) 02/01/2020, 04/03/2020   Yearly flu shots at work [employer] employee) Last Pap smear: per GYN Dr. Corinna Capra, 03/2018. Had Exam 01/2022.  ?pap done? Last mammogram: at Dr. Gregor Hams office earlier this month, results not yet received. Last colonoscopy: 11/2018.  Tortuous colon, small internal hemorrhoids, no polyps.  Rec repeat 5 years (due to FHx) Last DEXA: never Dentist: twice yearly Ophtho:  Yearly Exercise:   Walks around the hospital with her parents (dialysis for M, lung Ca in [place]). Walks some with her coworker, 2-3x/weeks (20-30 minutes) She does some stretches. No longer doing squats or  jumping jacks. No longer has gym membership.   PMH, PSH, SH and FH reviewed and updated.  Parents still living with her, father with lung cancer. Mother is on dialysis Paternal 1st cousin dx'd with breast cancer (age  84)   ROS: The patient denies anorexia, fever, headaches,  vision changes, decreased hearing, ear pain, sore throat, breast concerns, chest pain, dizziness, syncope, dyspnea on exertion, cough, swelling, nausea, vomiting, diarrhea, constipation, abdominal pain, melena, hematochezia, hematuria, incontinence, dysuria, vaginal bleeding, discharge, odor or itch, genital lesions, numbness, tingling, weakness, tremor, suspicious skin lesions, depression, anxiety, abnormal bleeding or enlarged lymph nodes. Allergies are controlled; breathing is good.  Denies tachycardia, palpitations. Heel/achilles pain? R shoulder pain      PHYSICAL EXAM:  LMP 11/07/2012    Wt Readings from Last 3 Encounters:  01/15/22 237 lb (107.5 kg)  06/20/21 235 lb (106.6 kg)  02/05/21 237 lb 12.8 oz (107.9 kg)    General Appearance:     Alert, cooperative, no distress, appears stated age. She is in good spirits. She did not change into gown for exam.  Head:     Normocephalic, without obvious abnormality, atraumatic   Eyes:     PERRL, conjunctiva/corneas clear, EOM's intact, fundi benign. Ptyregium on R.  Ears:     Normal TM's and external ear canals   Nose:    Not examined, wearing mask due to COVID-19 pandemic  Throat:    Not examined, wearing mask due to COVID-19 pandemic   Neck:    Supple, no lymphadenopathy;  thyroid:  no enlargement/tenderness/nodules; no carotid bruit or JVD.   Back:     Spine nontender, no curvature, ROM normal, no CVA tenderness   Lungs:      Clear to auscultation bilaterally without wheezes, rales or ronchi; respirations unlabored   Chest Wall:     No tenderness or deformity    Heart:     Regular rate and rhythm, S1 and S2 normal, no murmur, rub or gallop, no ectopy  Breast Exam:     Deferred to GYN   Abdomen:      Soft, non-tender, nondistended, normoactive bowel sounds, no masses, no hepatosplenomegaly   Genitalia:     Deferred to GYN        Extremities:    No clubbing, cyanosis or  edema.   Pulses:    2+ and symmetric all extremities   Skin:    Skin color, texture, turgor normal, no rashes or lesions. Exam is limited, not undressed.  Lymph nodes:    Cervical, supraclavicular nodes normal   Neurologic:    Normal strength, sensation and gait; reflexes 2+ and symmetric throughout                           Psych:   Normal mood, affect, hygiene and grooming  ***update if in gown, pterygium on R, mask  Lab Results  Component Value Date   WBC 3.1 (L) 02/06/2022   HGB 12.8 02/06/2022   HCT 39.5 02/06/2022   MCV 89 02/06/2022   PLT 146 (L) 02/06/2022     Chemistry      Component Value Date/Time   NA 141 02/06/2022 0835   K 3.8 02/06/2022 0835   CL 103 02/06/2022 0835   CO2 23 02/06/2022 0835   BUN 13 02/06/2022 0835   CREATININE 1.11 (H) 02/06/2022 0835   CREATININE 1.00 12/30/2016 1046      Component Value Date/Time   CALCIUM  10.1 02/06/2022 0835   ALKPHOS 59 02/06/2022 0835   AST 22 02/06/2022 0835   ALT 14 02/06/2022 0835   BILITOT 0.4 02/06/2022 0835     Vitamin D-OH 63.5 Fasting glucose 91  Lab Results  Component Value Date   CHOL 192 02/06/2022   HDL 53 02/06/2022   LDLCALC 114 (H) 02/06/2022   TRIG 140 02/06/2022   CHOLHDL 3.6 02/06/2022     ASSESSMENT/PLAN  Enter flu shot ([employer] employee), enter/offer/decline COVID booster  Has gotten rx's for ibuprofen and diclofenac from ortho---ensure not taking both. Saw Dr. Corinna Capra 01/31/22--PLEASE GET RECORDS, including notes, mammo, pap if done.  ??who rx'd z-pak 12/4 and 12/12??  RF lisinopril HCT ?not crestor Valtrex RF??  Borderline low WBC and plt  Discussed monthly self breast exams and yearly mammograms; at least 30 minutes of aerobic activity at least 5 days/week, weight-bearing exercise at least 2x/week; proper sunscreen use reviewed; healthy diet, including goals of calcium and vitamin D intake and alcohol recommendations (less than or equal to 1 drink/day) reviewed; regular seatbelt  use; changing batteries in smoke detectors.  Immunization recommendations discussed--continue yearly flu shots.  Recommended bivalent COVID booster, declined. Colonoscopy screening, UTD.   CPE 1 year.

## 2022-02-11 ENCOUNTER — Encounter: Payer: Self-pay | Admitting: Family Medicine

## 2022-02-11 ENCOUNTER — Encounter: Payer: 59 | Admitting: Family Medicine

## 2022-02-11 DIAGNOSIS — Z0000 Encounter for general adult medical examination without abnormal findings: Secondary | ICD-10-CM

## 2022-02-11 DIAGNOSIS — J45909 Unspecified asthma, uncomplicated: Secondary | ICD-10-CM

## 2022-02-11 DIAGNOSIS — E559 Vitamin D deficiency, unspecified: Secondary | ICD-10-CM

## 2022-02-11 DIAGNOSIS — I10 Essential (primary) hypertension: Secondary | ICD-10-CM

## 2022-02-11 DIAGNOSIS — E782 Mixed hyperlipidemia: Secondary | ICD-10-CM

## 2022-02-23 ENCOUNTER — Other Ambulatory Visit: Payer: Self-pay

## 2022-02-27 ENCOUNTER — Encounter: Payer: Self-pay | Admitting: Internal Medicine

## 2022-03-02 ENCOUNTER — Other Ambulatory Visit (HOSPITAL_COMMUNITY): Payer: Self-pay

## 2022-03-09 ENCOUNTER — Encounter: Payer: Self-pay | Admitting: Family Medicine

## 2022-03-24 ENCOUNTER — Other Ambulatory Visit: Payer: Self-pay | Admitting: Family Medicine

## 2022-03-24 ENCOUNTER — Other Ambulatory Visit (HOSPITAL_COMMUNITY): Payer: Self-pay

## 2022-03-24 DIAGNOSIS — I10 Essential (primary) hypertension: Secondary | ICD-10-CM

## 2022-03-24 MED ORDER — LISINOPRIL-HYDROCHLOROTHIAZIDE 20-12.5 MG PO TABS
1.0000 | ORAL_TABLET | Freq: Every day | ORAL | 0 refills | Status: DC
  Filled 2022-03-24: qty 90, 90d supply, fill #0

## 2022-03-25 ENCOUNTER — Other Ambulatory Visit (HOSPITAL_COMMUNITY): Payer: Self-pay

## 2022-04-10 ENCOUNTER — Other Ambulatory Visit (HOSPITAL_COMMUNITY): Payer: Self-pay

## 2022-04-10 DIAGNOSIS — B002 Herpesviral gingivostomatitis and pharyngotonsillitis: Secondary | ICD-10-CM | POA: Diagnosis not present

## 2022-04-10 DIAGNOSIS — Z87448 Personal history of other diseases of urinary system: Secondary | ICD-10-CM | POA: Diagnosis not present

## 2022-04-10 DIAGNOSIS — N959 Unspecified menopausal and perimenopausal disorder: Secondary | ICD-10-CM | POA: Diagnosis not present

## 2022-04-10 DIAGNOSIS — Z01419 Encounter for gynecological examination (general) (routine) without abnormal findings: Secondary | ICD-10-CM | POA: Diagnosis not present

## 2022-04-10 DIAGNOSIS — Z1231 Encounter for screening mammogram for malignant neoplasm of breast: Secondary | ICD-10-CM | POA: Diagnosis not present

## 2022-04-10 DIAGNOSIS — Z6835 Body mass index (BMI) 35.0-35.9, adult: Secondary | ICD-10-CM | POA: Diagnosis not present

## 2022-04-10 DIAGNOSIS — R319 Hematuria, unspecified: Secondary | ICD-10-CM | POA: Diagnosis not present

## 2022-04-10 LAB — HM MAMMOGRAPHY

## 2022-04-10 LAB — HM PAP SMEAR: HM Pap smear: NEGATIVE

## 2022-04-10 MED ORDER — VALACYCLOVIR HCL 1 G PO TABS
1000.0000 mg | ORAL_TABLET | Freq: Every day | ORAL | 3 refills | Status: DC
  Filled 2022-04-10: qty 30, 30d supply, fill #0

## 2022-04-20 ENCOUNTER — Other Ambulatory Visit (HOSPITAL_COMMUNITY): Payer: Self-pay

## 2022-04-21 NOTE — Patient Instructions (Incomplete)
  HEALTH MAINTENANCE RECOMMENDATIONS:  It is recommended that you get at least 30 minutes of aerobic exercise at least 5 days/week (for weight loss, you may need as much as 60-90 minutes). This can be any activity that gets your heart rate up. This can be divided in 10-15 minute intervals if needed, but try and build up your endurance at least once a week.  Weight bearing exercise is also recommended twice weekly.  Eat a healthy diet with lots of vegetables, fruits and fiber.  "Colorful" foods have a lot of vitamins (ie green vegetables, tomatoes, red peppers, etc).  Limit sweet tea, regular sodas and alcoholic beverages, all of which has a lot of calories and sugar.  Up to 1 alcoholic drink daily may be beneficial for women (unless trying to lose weight, watch sugars).  Drink a lot of water.  Calcium recommendations are 1200-1500 mg daily (1500 mg for postmenopausal women or women without ovaries), and vitamin D 1000 IU daily.  This should be obtained from diet and/or supplements (vitamins), and calcium should not be taken all at once, but in divided doses.  Monthly self breast exams and yearly mammograms for women over the age of 47 is recommended.  Sunscreen of at least SPF 30 should be used on all sun-exposed parts of the skin when outside between the hours of 10 am and 4 pm (not just when at beach or pool, but even with exercise, golf, tennis, and yard work!)  Use a sunscreen that says "broad spectrum" so it covers both UVA and UVB rays, and make sure to reapply every 1-2 hours.  Remember to change the batteries in your smoke detectors when changing your clock times in the spring and fall. Carbon monoxide detectors are recommended for your home.  Use your seat belt every time you are in a car, and please drive safely and not be distracted with cell phones and texting while driving.  Consider taking Coenzyme Q10 if you have any recurrent muscle aches that you think may be from the cholesterol  medication.
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
## 2022-04-22 ENCOUNTER — Encounter: Payer: Self-pay | Admitting: Family Medicine

## 2022-04-22 ENCOUNTER — Ambulatory Visit: Payer: 59 | Admitting: Family Medicine

## 2022-04-22 ENCOUNTER — Other Ambulatory Visit (HOSPITAL_COMMUNITY): Payer: Self-pay

## 2022-04-22 VITALS — BP 118/70 | HR 60 | Ht 68.0 in | Wt 226.0 lb

## 2022-04-22 DIAGNOSIS — J45909 Unspecified asthma, uncomplicated: Secondary | ICD-10-CM

## 2022-04-22 DIAGNOSIS — Z0000 Encounter for general adult medical examination without abnormal findings: Secondary | ICD-10-CM | POA: Diagnosis not present

## 2022-04-22 DIAGNOSIS — E559 Vitamin D deficiency, unspecified: Secondary | ICD-10-CM

## 2022-04-22 DIAGNOSIS — E782 Mixed hyperlipidemia: Secondary | ICD-10-CM | POA: Diagnosis not present

## 2022-04-22 DIAGNOSIS — I10 Essential (primary) hypertension: Secondary | ICD-10-CM

## 2022-04-22 MED ORDER — ALBUTEROL SULFATE HFA 108 (90 BASE) MCG/ACT IN AERS
2.0000 | INHALATION_SPRAY | Freq: Four times a day (QID) | RESPIRATORY_TRACT | 1 refills | Status: DC | PRN
  Filled 2022-04-22: qty 6.7, 25d supply, fill #0

## 2022-04-22 MED ORDER — ROSUVASTATIN CALCIUM 10 MG PO TABS
10.0000 mg | ORAL_TABLET | Freq: Every day | ORAL | 3 refills | Status: DC
  Filled 2022-04-22 – 2022-06-29 (×2): qty 90, 90d supply, fill #0
  Filled 2022-09-26: qty 90, 90d supply, fill #1
  Filled 2022-12-28: qty 90, 90d supply, fill #2
  Filled 2023-04-07: qty 90, 90d supply, fill #3

## 2022-04-23 ENCOUNTER — Other Ambulatory Visit (HOSPITAL_COMMUNITY): Payer: Self-pay

## 2022-05-01 ENCOUNTER — Other Ambulatory Visit (HOSPITAL_COMMUNITY): Payer: Self-pay

## 2022-05-01 ENCOUNTER — Telehealth: Payer: 59 | Admitting: Medical

## 2022-05-01 VITALS — Wt 226.0 lb

## 2022-05-01 DIAGNOSIS — R051 Acute cough: Secondary | ICD-10-CM

## 2022-05-01 DIAGNOSIS — J019 Acute sinusitis, unspecified: Secondary | ICD-10-CM

## 2022-05-01 MED ORDER — BENZONATATE 200 MG PO CAPS
200.0000 mg | ORAL_CAPSULE | Freq: Three times a day (TID) | ORAL | 0 refills | Status: DC | PRN
  Filled 2022-05-01: qty 30, 10d supply, fill #0

## 2022-05-01 MED ORDER — AZITHROMYCIN 250 MG PO TABS
ORAL_TABLET | ORAL | 0 refills | Status: AC
  Filled 2022-05-01: qty 5, 4d supply, fill #0

## 2022-05-01 NOTE — Progress Notes (Signed)
Subjective:     Patient ID: Candace Walker, female   DOB: [birth year], 52 y.o.   MRN: [id number]  This visit type was conducted due to national recommendations for restrictions regarding the COVID-19 Pandemic (e.g. social distancing) in an effort to limit this patient's exposure and mitigate transmission in our community.  Due to their co-morbid illnesses, this patient is at least at moderate risk for complications without adequate follow up.  This format is felt to be most appropriate for this patient at this time.    Documentation for virtual audio and video telecommunications through [place] encounter:  The patient was located at home. The provider was located in the office. The patient did consent to this visit and is aware of possible charges through their insurance for this visit.  The other persons participating in this telemedicine service were none. Time spent on call was 20 minutes and in review of previous records 20 minutes total.  This virtual service is not related to other E/M service within previous 7 days.   HPI Chief Complaint  Patient presents with   sinus issues    Sinus issues since Sunday, yellow mucous, left side under ribs pain, deep cough, runny nose, headache, sore throat, fever Tuesday, no body aches, no SOB,    Virtual for illness.  She notes about 5-6 days of not feeling well.  She notes head stuffiness, headache, runny nose in evening, coughing, some chest discomfort from coughing.  Getting yellow mucous.  Had fever earlier in the week but that resolved with tylenol.  Bought OTC sinus medication for headache cough and mucous.  No body ache or chills.  No NVD.  Felt a little SOB yesterday, used inhaler yesterday.  Did covid test earlier in the week and it was negative.    No other aggravating or relieving factors. No other complaint.     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     albuterol (VENTOLIN HFA) 108 (90 Base) MCG/ACT inhaler Inhale 2 puffs into the lungs every 6 (six) hours as needed for wheezing 6.7 g 1   Ascorbic Acid (VITAMIN C) 1000 MG tablet Take 1,000 mg by mouth daily.     cholecalciferol (VITAMIN D) 1000 UNITS tablet Take 1,000 Units by mouth daily. Reported on 07/22/2015     diclofenac (VOLTAREN) 75 MG EC tablet Take 1 tablet (75 mg total) by mouth 2 (two) times daily. 30 tablet 2   fexofenadine (ALLEGRA) 180 MG tablet Take 180 mg by mouth at bedtime. Reported on 07/22/2015     fish oil-omega-3 fatty acids 1000 MG capsule Take 3 g by mouth daily. Reported on 07/22/2015     fluticasone (FLONASE) 50 MCG/ACT nasal spray Place 1 spray into both nostrils daily.  ibuprofen (ADVIL) 800 MG tablet Take 1 tablet (800 mg total) by mouth every 8 (eight) hours as needed. 50 tablet 0   lisinopril-hydrochlorothiazide (ZESTORETIC) 20-12.5 MG tablet Take 1 tablet by mouth daily. 90 tablet 0   methocarbamol (ROBAXIN) 500 MG tablet Take 1 tablet (500 mg total) by mouth every 8 (eight) hours as needed for muscle spasms. 50 tablet 0   nitroGLYCERIN (NITRODUR - DOSED IN MG/24 HR) 0.2 mg/hr patch Apply 1/4 patch to area daily as needed 5 patch 12   rosuvastatin (CRESTOR) 10 MG tablet Take 1 tablet (10 mg total) by mouth daily. 90 tablet 3   valACYclovir (VALTREX) 1000 MG tablet Take 1 tablet (1,000 mg total) by mouth  daily. 30 tablet 3   vitamin B-12 (CYANOCOBALAMIN) 1000 MCG tablet Take 1,000 mcg by mouth daily.     No current facility-administered medications on file prior to visit.     Review of Systems As in subjective    Objective:   Physical Exam Due to coronavirus pandemic stay at home measures, patient visit was virtual and they were not examined in person.   Wt 226 lb (102.5 kg)   LMP 11/07/2012   BMI 34.36 kg/m   Gen: wd, wn, nad No labored breathing or wheezing      Assessment:     Encounter Diagnoses  Name Primary?   Acute non-recurrent sinusitis, unspecified location Yes   Acute cough        Plan:     We discussed symptoms and concerns.  Advised not to use Sudafed or other decongestant that could raise her blood pressure.  Instead can use Mucinex DM or Coricidin HBP for symptoms.  Begin antibiotic as below, can use Tessalon Perles as needed for cough.  Continue to hydrate well with water throughout the day, Tylenol for fever or not feeling well, add nasal saline flush.  If not much improved in the next 3 to 5 days then call back  [name] was seen today for sinus issues.  Diagnoses and all orders for this visit:  Acute non-recurrent sinusitis, unspecified location  Acute cough  Other orders -     azithromycin (ZITHROMAX) 250 MG tablet; 2 tablets day 1, then 1 tablet days 2-4 -     benzonatate (TESSALON) 200 MG capsule; Take 1 capsule (200 mg total) by mouth 3 (three) times daily as needed for cough.    Follow-up as needed

## 2022-05-13 ENCOUNTER — Encounter: Payer: Self-pay | Admitting: *Deleted

## 2022-06-12 ENCOUNTER — Other Ambulatory Visit (HOSPITAL_COMMUNITY): Payer: Self-pay

## 2022-06-12 ENCOUNTER — Ambulatory Visit (INDEPENDENT_AMBULATORY_CARE_PROVIDER_SITE_OTHER): Payer: 59

## 2022-06-12 ENCOUNTER — Encounter: Payer: Self-pay | Admitting: Podiatry

## 2022-06-12 ENCOUNTER — Ambulatory Visit (INDEPENDENT_AMBULATORY_CARE_PROVIDER_SITE_OTHER): Payer: 59 | Admitting: Podiatry

## 2022-06-12 DIAGNOSIS — M7751 Other enthesopathy of right foot: Secondary | ICD-10-CM

## 2022-06-12 DIAGNOSIS — M25571 Pain in right ankle and joints of right foot: Secondary | ICD-10-CM | POA: Diagnosis not present

## 2022-06-12 DIAGNOSIS — M7661 Achilles tendinitis, right leg: Secondary | ICD-10-CM

## 2022-06-12 MED ORDER — TRIAMCINOLONE ACETONIDE 10 MG/ML IJ SUSP
20.0000 mg | Freq: Once | INTRAMUSCULAR | Status: AC
  Administered 2022-06-12: 20 mg

## 2022-06-12 MED ORDER — DICLOFENAC SODIUM 75 MG PO TBEC
75.0000 mg | DELAYED_RELEASE_TABLET | Freq: Two times a day (BID) | ORAL | 2 refills | Status: DC
  Filled 2022-06-12: qty 50, 25d supply, fill #0
  Filled 2022-09-03 – 2022-09-26 (×3): qty 50, 25d supply, fill #1
  Filled 2022-11-11: qty 50, 25d supply, fill #2

## 2022-06-15 NOTE — Progress Notes (Signed)
Subjective:   Patient ID: Candace Walker, female   DOB: 52 y.o.   MRN: [id number]   HPI Patient is been having a lot of pain with her right foot with 2 different conditions now with 1 being inflammation of the posterior heel lateral side at the insertion tendon calcaneus and secondarily quite a bit inflammation of the sinus tarsi right with fluid buildup.  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  Today I went ahead and I discussed injection explaining risk and also at this point will require full immobilization     Plan:  H&P reviewed and I went ahead today I did do a careful injection the lateral side posterior heel insertion 3 mg dexamethasone Kenalog 5 mg Xylocaine then went ahead to the sinus tarsi did sterile prep injected the sinus tarsi 3 mg Kenalog 5 mg Xylocaine and applied air fracture walker to completely immobilize the foot and lower leg.  Patient will be seen back to recheck all questions answered  X-rays indicate there is moderate arthritis of the subtalar joint right spurring posterior heel moderate flatfoot deformity

## 2022-06-16 ENCOUNTER — Other Ambulatory Visit: Payer: Self-pay | Admitting: Podiatry

## 2022-06-16 DIAGNOSIS — M25571 Pain in right ankle and joints of right foot: Secondary | ICD-10-CM

## 2022-06-16 DIAGNOSIS — M7661 Achilles tendinitis, right leg: Secondary | ICD-10-CM

## 2022-06-16 DIAGNOSIS — M7751 Other enthesopathy of right foot: Secondary | ICD-10-CM

## 2022-06-24 ENCOUNTER — Encounter: Payer: Commercial Managed Care - PPO | Admitting: Family Medicine

## 2022-06-29 ENCOUNTER — Other Ambulatory Visit: Payer: Self-pay | Admitting: Orthopaedic Surgery

## 2022-06-30 ENCOUNTER — Other Ambulatory Visit (HOSPITAL_COMMUNITY): Payer: Self-pay

## 2022-06-30 ENCOUNTER — Other Ambulatory Visit: Payer: Self-pay

## 2022-06-30 ENCOUNTER — Other Ambulatory Visit: Payer: Self-pay | Admitting: Family Medicine

## 2022-06-30 DIAGNOSIS — I10 Essential (primary) hypertension: Secondary | ICD-10-CM

## 2022-06-30 MED ORDER — LISINOPRIL-HYDROCHLOROTHIAZIDE 20-12.5 MG PO TABS
1.0000 | ORAL_TABLET | Freq: Every day | ORAL | 2 refills | Status: DC
  Filled 2022-06-30: qty 90, 90d supply, fill #0
  Filled 2022-09-26: qty 90, 90d supply, fill #1
  Filled 2022-12-28: qty 90, 90d supply, fill #2

## 2022-07-03 ENCOUNTER — Other Ambulatory Visit (HOSPITAL_COMMUNITY): Payer: Self-pay

## 2022-07-22 ENCOUNTER — Encounter: Payer: Commercial Managed Care - PPO | Admitting: Family Medicine

## 2022-09-04 ENCOUNTER — Other Ambulatory Visit (HOSPITAL_COMMUNITY): Payer: Self-pay

## 2022-09-15 ENCOUNTER — Other Ambulatory Visit (HOSPITAL_COMMUNITY): Payer: Self-pay

## 2022-09-21 ENCOUNTER — Other Ambulatory Visit (HOSPITAL_COMMUNITY): Payer: Self-pay

## 2022-10-01 ENCOUNTER — Other Ambulatory Visit (HOSPITAL_COMMUNITY): Payer: Self-pay

## 2022-11-13 ENCOUNTER — Other Ambulatory Visit (HOSPITAL_COMMUNITY): Payer: Self-pay

## 2022-11-14 ENCOUNTER — Encounter: Payer: Self-pay | Admitting: Family Medicine

## 2022-11-16 ENCOUNTER — Encounter: Payer: Self-pay | Admitting: *Deleted

## 2022-12-28 ENCOUNTER — Other Ambulatory Visit (HOSPITAL_COMMUNITY): Payer: Self-pay

## 2023-03-30 ENCOUNTER — Other Ambulatory Visit (HOSPITAL_COMMUNITY): Payer: Self-pay | Admitting: *Deleted

## 2023-03-30 DIAGNOSIS — I10 Essential (primary) hypertension: Secondary | ICD-10-CM

## 2023-04-02 ENCOUNTER — Ambulatory Visit (HOSPITAL_BASED_OUTPATIENT_CLINIC_OR_DEPARTMENT_OTHER)
Admission: RE | Admit: 2023-04-02 | Discharge: 2023-04-02 | Disposition: A | Discharge status: Home / Self Care | Payer: Self-pay | Source: Ambulatory Visit | Attending: Cardiology | Admitting: Cardiology

## 2023-04-02 DIAGNOSIS — I10 Essential (primary) hypertension: Secondary | ICD-10-CM | POA: Insufficient documentation

## 2023-04-04 ENCOUNTER — Encounter: Payer: Self-pay | Admitting: Cardiology

## 2023-04-07 ENCOUNTER — Other Ambulatory Visit (INDEPENDENT_AMBULATORY_CARE_PROVIDER_SITE_OTHER): Payer: 59

## 2023-04-07 ENCOUNTER — Other Ambulatory Visit: Payer: Self-pay | Admitting: Family Medicine

## 2023-04-07 ENCOUNTER — Ambulatory Visit (INDEPENDENT_AMBULATORY_CARE_PROVIDER_SITE_OTHER): Payer: Self-pay | Admitting: Orthopaedic Surgery

## 2023-04-07 ENCOUNTER — Other Ambulatory Visit (HOSPITAL_COMMUNITY): Payer: Self-pay

## 2023-04-07 DIAGNOSIS — G8929 Other chronic pain: Secondary | ICD-10-CM | POA: Diagnosis not present

## 2023-04-07 DIAGNOSIS — I10 Essential (primary) hypertension: Secondary | ICD-10-CM

## 2023-04-07 DIAGNOSIS — M79671 Pain in right foot: Secondary | ICD-10-CM

## 2023-04-07 DIAGNOSIS — M25561 Pain in right knee: Secondary | ICD-10-CM

## 2023-04-07 MED ORDER — DICLOFENAC SODIUM 75 MG PO TBEC
75.0000 mg | DELAYED_RELEASE_TABLET | Freq: Two times a day (BID) | ORAL | 2 refills | Status: DC
  Filled 2023-04-07: qty 30, 15d supply, fill #0
  Filled 2023-05-19 – 2023-05-20 (×2): qty 30, 15d supply, fill #1
  Filled 2023-06-13 – 2023-07-19 (×2): qty 30, 15d supply, fill #2

## 2023-04-07 MED ORDER — LISINOPRIL-HYDROCHLOROTHIAZIDE 20-12.5 MG PO TABS
1.0000 | ORAL_TABLET | Freq: Every day | ORAL | 0 refills | Status: DC
Start: 2023-04-07 — End: 2023-07-19
  Filled 2023-04-07: qty 90, 90d supply, fill #0

## 2023-04-07 MED ORDER — PREDNISONE 10 MG (21) PO TBPK
ORAL_TABLET | ORAL | 3 refills | Status: DC
  Filled 2023-04-07: qty 21, 6d supply, fill #0

## 2023-04-07 NOTE — Progress Notes (Signed)
 Office Visit Note   Patient: Candace Walker           Date of Birth: Mar 02, 1970           MRN: [id number] Visit Date: 04/07/2023              Requested by: Joselyn Arrow, MD [address] [place],  [place] [address] PCP: Joselyn Arrow, MD   Assessment & Plan: Visit Diagnoses:  1. Chronic pain of right knee   2. Chronic heel pain, right     Plan: Candace Walker is a 53 year old female with right knee osteoarthritis flare and right Achilles tendinosis.  Disease process explained.  She is currently wearing orthotics for her feet.  Home exercise program provided for Achilles stretching.  Prescription for diclofenac and prednisone which should help both problems.  Follow-Up Instructions: No follow-ups on file.   Orders:  Orders Placed This Encounter  Procedures   XR KNEE 3 VIEW RIGHT   XR Os Calcis Right   Meds ordered this encounter  Medications   diclofenac (VOLTAREN) 75 MG EC tablet    Sig: Take 1 tablet (75 mg total) by mouth 2 (two) times daily.    Dispense:  30 tablet    Refill:  2   predniSONE (STERAPRED UNI-PAK 21 TAB) 10 MG (21) TBPK tablet    Sig: Take as directed    Dispense:  21 tablet    Refill:  3      Procedures: No procedures performed   Clinical Data: No additional findings.   Subjective: Chief Complaint  Patient presents with   Right Knee - Pain   Right Heel - Pain    HPI Candace Walker is a 53 year old female here for evaluation of right knee and right heel pain.  First began with right heel pain which then caused the knee to hurt.  Feels tightness in the heel.  She wears orthotics in her shoes.  She has used diclofenac in the past which helps. Review of Systems  Constitutional: Negative.   HENT: Negative.    Eyes: Negative.   Respiratory: Negative.    Cardiovascular: Negative.   Endocrine: Negative.   Musculoskeletal: Negative.   Neurological: Negative.   Hematological: Negative.   Psychiatric/Behavioral: Negative.    All other systems  reviewed and are negative.    Objective: Vital Signs: LMP 11/07/2012   Physical Exam Vitals and nursing note reviewed.  Constitutional:      Appearance: She is well-developed.  HENT:     Head: Atraumatic.     Nose: Nose normal.  Eyes:     Extraocular Movements: Extraocular movements intact.  Cardiovascular:     Pulses: Normal pulses.  Pulmonary:     Effort: Pulmonary effort is normal.  Abdominal:     Palpations: Abdomen is soft.  Musculoskeletal:     Cervical back: Neck supple.  Skin:    General: Skin is warm.     Capillary Refill: Capillary refill takes less than 2 seconds.  Neurological:     Mental Status: She is alert. Mental status is at baseline.  Psychiatric:        Behavior: Behavior normal.        Thought Content: Thought content normal.        Judgment: Judgment normal.     Ortho Exam Examination of the right knee shows trace effusion.  Normal range of motion.  No joint line tenderness.  Normal strength.  Examination of the right heel shows tenderness to the distal  Achilles insertion. Specialty Comments:  No specialty comments available.  Imaging: XR Os Calcis Right Result Date: 04/07/2023 X-rays of the right heel show degenerative spurring consistent with chronic Achilles tendinosis.  No acute abnormalities.  XR KNEE 3 VIEW RIGHT Result Date: 04/07/2023 X-rays of the right knee show mild tricompartment osteoarthritis.    PMFS History: Patient Active Problem List   Diagnosis Date Noted   Right Achilles tendinitis 06/20/2021   Peroneal tendinitis, right leg 04/26/2018   Right knee pain 09/09/2017   Plantar fasciitis of left foot 09/09/2017   S/P laparoscopic assisted vaginal hysterectomy (LAVH) 06/12/2013   Obesity (BMI 30-39.9) 04/25/2013   Vitamin D deficiency    Mixed hyperlipidemia    Essential hypertension, benign    Asthma with allergic rhinitis    Allergic rhinitis    Past Medical History:  Diagnosis Date   Allergic rhinitis, cause  unspecified    Allergy    Arthritis    bursitis in hips, recently dx with avascular necrosis femor head   Asthma with allergic rhinitis    rarely uses inhaler unless she gets sick   Essential hypertension, benign    Heart murmur    Mixed hyperlipidemia    no meds, diet controlled   Positive PPD    h/o BCG vaccine, negative chest xray   SVD (spontaneous vaginal delivery)    x 2   Unspecified vitamin D deficiency     Family History  Problem Relation Age of Onset   Diabetes Mother    Hypertension Mother    Allergies Mother    Colon polyps Mother 42   Kidney disease Mother        on transplant list; started dialysis 2022   Heart disease Father 16       MI   Hyperlipidemia Father    Hypertension Father    Lung cancer Father 22       smoker   Hypertension Sister    Diabetes Sister        borderline   Colon polyps Sister    Rheum arthritis Maternal Grandmother    Breast cancer Paternal Grandmother    Rheum arthritis Paternal Grandmother    Breast cancer Cousin 23   Cancer Cousin        "rare blood cancer" and multiple myeloma   Multiple myeloma Cousin    Colon cancer Neg Hx    Esophageal cancer Neg Hx    Liver disease Neg Hx    Stomach cancer Neg Hx    Rectal cancer Neg Hx     Past Surgical History:  Procedure Laterality Date   ENDOMETRIAL ABLATION  2007   Dr. Rana Snare   LAPAROSCOPIC ASSISTED VAGINAL HYSTERECTOMY N/A 06/12/2013   Procedure: LAPAROSCOPIC ASSISTED VAGINAL HYSTERECTOMY;  Surgeon: Turner Daniels, MD;  Location: [facility];  Service: Gynecology;  Laterality: N/A;   LAPAROSCOPIC BILATERAL SALPINGECTOMY  06/12/2013   Procedure: LAPAROSCOPIC BILATERAL SALPINGECTOMY;  Surgeon: Turner Daniels, MD;  Location: [facility];  Service: Gynecology;;   TUBAL LIGATION     WISDOM TOOTH EXTRACTION     right bottom lower tooth   Social History   Occupational History   Occupation: Curator: [employer]  Tobacco Use   Smoking status: Never   Smokeless tobacco: Never   Vaping Use   Vaping status: Never Used  Substance and Sexual Activity   Alcohol use: Yes    Comment: occasional (if goes out with friends, not even weekly)  Drug use: No   Sexual activity: Not Currently    Birth control/protection: Surgical    Comment: ablation and tubal ligation and hysterectomy

## 2023-04-14 ENCOUNTER — Other Ambulatory Visit: Payer: Self-pay | Admitting: Family Medicine

## 2023-04-14 ENCOUNTER — Other Ambulatory Visit (HOSPITAL_COMMUNITY): Payer: Self-pay

## 2023-04-14 DIAGNOSIS — J45909 Unspecified asthma, uncomplicated: Secondary | ICD-10-CM

## 2023-04-14 MED ORDER — FLUTICASONE PROPIONATE 50 MCG/ACT NA SUSP
1.0000 | Freq: Every day | NASAL | 0 refills | Status: DC
  Filled 2023-04-14: qty 16, 60d supply, fill #0

## 2023-04-14 MED ORDER — ALBUTEROL SULFATE HFA 108 (90 BASE) MCG/ACT IN AERS
2.0000 | INHALATION_SPRAY | Freq: Four times a day (QID) | RESPIRATORY_TRACT | 0 refills | Status: AC | PRN
  Filled 2023-04-14: qty 6.7, 25d supply, fill #0

## 2023-04-14 NOTE — Telephone Encounter (Signed)
 Copied from CRM [id number]. Topic: Clinical - Medication Refill >> Apr 14, 2023 10:31 AM Gery Pray wrote: Most Recent Primary Care Visit:  Provider: Jac Canavan  Department: [facility]  Visit Type: MYCHART VIDEO VISIT  Date: 05/01/2022  Medication: albuterol (VENTOLIN HFA) 108 (90 Base) MCG/ACT inhaler, fluticasone (FLONASE) 50 MCG/ACT nasal spray  Has the patient contacted their pharmacy? Yes (Agent: If no, request that the patient contact the pharmacy for the refill. If patient does not wish to contact the pharmacy document the reason why and proceed with request.) (Agent: If yes, when and what did the pharmacy advise?) Pharmacy stated to call the provider's office   Is this the correct pharmacy for this prescription? Yes If no, delete pharmacy and type the correct one.  This is the patient's preferred pharmacy:  [facility] [address] [place] [address] Phone: [phone] Fax: [phone]   Has the prescription been filled recently? No  Is the patient out of the medication? Yes  Has the patient been seen for an appointment in the last year OR does the patient have an upcoming appointment? Yes  Can we respond through MyChart? Yes  Agent: Please be advised that Rx refills may take up to 3 business days. We ask that you follow-up with your pharmacy.

## 2023-04-14 NOTE — Telephone Encounter (Signed)
Are these Anderson to refill? 

## 2023-04-22 ENCOUNTER — Encounter: Payer: Self-pay | Admitting: *Deleted

## 2023-04-25 DIAGNOSIS — I700 Atherosclerosis of aorta: Secondary | ICD-10-CM | POA: Insufficient documentation

## 2023-04-25 NOTE — Progress Notes (Unsigned)
 No chief complaint on file.  Candace Walker is a 53 y.o. female who presents for a complete physical.   She saw ortho last month with R knee pain (OA flare) and achilles tendonitis.  She had diclofenac refilled (30x2RF) and prednisone (pack with 3RF).   Hypertension follow-up:  She is compliant with taking lisinopril HCTZ and denies side effects.  Blood pressures are running  ***   Denies dizziness, headaches, chest pain, palpitations or edema.  She wears compression stockings at work.   BP Readings from Last 3 Encounters:  04/22/22 118/70  01/15/22 129/74  02/05/21 120/76   She has h/o palpitations, episodes of tachycardia. She saw Dr. Flora Lipps in 08/2019, echo showed normal EF, grade II diastolic dysfunction, otherwise okay. She never got the Zio patch, and hasn't had any recurrent issues with palpitations or tachycardia.    Hyperlipidemia: Patient tries to follow a lowfat, low cholesterol diet. She is compliant with taking rosuvastatin at night, denies any side effects.  She denies any changes in diet since last year-- Egg whites only, no red meat. Not much cheese. No mayo. Eats a lot of chicken, oatmeal. Uses vinaigrette dressings on salad, eating more salads now (kale and spinach). Lipids have been at goal, due for recheck.  Lab Results  Component Value Date   CHOL 192 02/06/2022   HDL 53 02/06/2022   LDLCALC 114 (H) 02/06/2022   TRIG 140 02/06/2022   CHOLHDL 3.6 02/06/2022    She had coronary calcium scan in 03/2023, ordered by Dr. Servando Salina.  IMPRESSION: Coronary calcium score of 0 Agatston units. This suggests low risk for future cardiac events.  Over-read: IMPRESSION: 1. Small hiatal hernia. 2.  Aortic Atherosclerosis (ICD10-I70.0).    Allergies and asthma:  She hasn't taken any singulair in 4 years. She did very well related to changes related to COVID--no visitors with perfume, and no flowers, so hadn't had any problems. Even now, she continues to do well, no  flares.  She continues to take Allegra daily, Flonase as needed, and is doing well.   Hasn't had any asthma flares since COVID started (no longer needs FMLA forms related to this issue). She last needed albuterol *** Albuterol was refilled in 03/2023.   (In the past, after exposure to perfume or flowers, she needed to take a 30 minute break, albuterol prn (and avoids going back into that particular room for the rest of the shift).  Tries to avoid by switching with somebody when she sees a lot of flowers in the patient's room, before any problems.) Last spirometry was normal in 01/2019.   H/o vitamin D deficiency:  Last check was normal at 76.5 in 03/2022 (by GYN), when taking 1000 IU daily.  She is currently taking 1000 IU daily.     Microscopic hematuria:  She recalls seeing a urologist many years ago (at [facility]). She reports she had a cystoscopy and recalls being told everything was normal.  She reports persistent microscopic hematuria.  Urine is checked by GYN. CT abdomen okay in 03/2013. She denies any gross hematuria or any urinary complaints.   Herpes labialis--infrequent outbreaks. Last outbreak was over a year ago. ***    Immunization History  Administered Date(s) Administered   Influenza Split 10/15/2010, 11/19/2011, 10/31/2013, 11/08/2021   Influenza Whole 11/12/2012   Influenza-Unspecified 11/05/2014, 11/17/2015, 11/09/2016, 11/16/2017, 11/11/2019, 11/16/2020, 11/08/2021, 11/14/2022   PFIZER(Purple Top)SARS-COV-2 Vaccination 02/05/2019, 02/25/2019, 11/24/2019   Pneumococcal Polysaccharide-23 04/24/2013   Tdap 04/24/2013   Varicella 02/01/2020, 04/03/2020  Zoster Recombinant(Shingrix) 02/01/2020, 04/03/2020   Declines COVID boosters. Yearly flu shots at work [employer] employee) Last Pap smear: per GYN Dr. Rana Snare, 03/2022, normal Last mammogram: at Dr. Vance Gather office 03/2022 Last colonoscopy: 11/2018.  Tortuous colon, small internal hemorrhoids, no polyps.  Rec repeat 5 years (due  to FHx) Last DEXA: never Dentist: twice yearly Ophtho:  Yearly Exercise:   Exercises with weights and bands.  She is walking a lot on the job. Doesn't walk for exercise due to R heel pain.  She plans to walk (slowly) when the weather is nicer.     PMH, PSH, SH and FH reviewed and updated.   Last year-- Parents still living with her, father with lung cancer. He is doing well. Mother is doing well with dialysis. A cousin recently passed away from a rare blood cancer, died within 2 weeks of diagnosis (age 56).      ROS: The patient denies anorexia, fever, headaches,  vision changes, decreased hearing, ear pain, sore throat, breast concerns, chest pain, dizziness, syncope, dyspnea on exertion, cough, swelling, nausea, vomiting, diarrhea, constipation, abdominal pain, melena, hematochezia, hematuria, incontinence, dysuria, vaginal bleeding, discharge, odor or itch, genital lesions, numbness, tingling, weakness, tremor, suspicious skin lesions, depression, anxiety, abnormal bleeding or enlarged lymph nodes. Allergies are controlled; breathing is good.  Denies tachycardia, palpitations. Denies any hot flashes or night sweats. Continues to have R heel/achilles pain, and R knee pain        PHYSICAL EXAM:   LMP 11/07/2012   Wt Readings from Last 3 Encounters:  05/01/22 226 lb (102.5 kg)  04/22/22 226 lb (102.5 kg)  01/15/22 237 lb (107.5 kg)  12/2021 237# 02/05/21 (CPE) 237# 12.8 oz   General Appearance:     Alert, cooperative, no distress, appears stated age. She is in good spirits. She did not change into gown for exam.  Head:     Normocephalic, without obvious abnormality, atraumatic   Eyes:     PERRL, conjunctiva/corneas clear, EOM's intact, fundi benign. Ptyregium on R, not yet encroaching pupil, but getting closer.  Ears:     Normal TM's and external ear canals   Nose:    No drainage or sinus tenderness  Throat:    Normal mucosa  Neck:    Supple, no lymphadenopathy;  thyroid:  no enlargement/ tenderness/nodules; no carotid bruit or JVD.   Back:     Spine nontender, no curvature, ROM normal, no CVA tenderness   Lungs:      Clear to auscultation bilaterally without wheezes, rales or ronchi; respirations unlabored   Chest Wall:     No tenderness or deformity    Heart:     Regular rate and rhythm, S1 and S2 normal, no murmur, rub or gallop, no ectopy  Breast Exam:     Deferred to GYN   Abdomen:      Soft, non-tender, nondistended, normoactive bowel sounds, no masses, no hepatosplenomegaly   Genitalia:     Deferred to GYN        Extremities:    No clubbing, cyanosis or edema.   Pulses:    2+ and symmetric all extremities   Skin:    Skin color, texture, turgor normal, no rashes or lesions. Exam is limited, not undressed. Back is clear.  Lymph nodes:    Cervical, supraclavicular nodes normal   Neurologic:    Normal strength, sensation and gait; reflexes 2+ and symmetric throughout  Psych:   Normal mood, affect, hygiene and grooming        ASSESSMENT/PLAN   TdaP and Prevnar-20 Offer/decline COVID  Has she seen Dr. Rana Snare yet this year?  If so, get records/mammo (and pap, if done). If not, ensure scheduled.  Should need crestor RF--wait until labs back *** ? If needs valtrex RF  Discussed monthly self breast exams and yearly mammograms; at least 30 minutes of aerobic activity at least 5 days/week, weight-bearing exercise at least 2x/week; proper sunscreen use reviewed; healthy diet, including goals of calcium and vitamin D intake and alcohol recommendations (less than or equal to 1 drink/day) reviewed; regular seatbelt use; changing batteries in smoke detectors.  Immunization recommendations discussed--continue yearly flu shots.  Recommended bivalent COVID booster, declined. Colonoscopy screening--due again for colonoscopy in 11/2023   F/u 1 year, sooner prn.

## 2023-04-25 NOTE — Patient Instructions (Incomplete)
  HEALTH MAINTENANCE RECOMMENDATIONS:  It is recommended that you get at least 30 minutes of aerobic exercise at least 5 days/week (for weight loss, you may need as much as 60-90 minutes). This can be any activity that gets your heart rate up. This can be divided in 10-15 minute intervals if needed, but try and build up your endurance at least once a week.  Weight bearing exercise is also recommended twice weekly.  Eat a healthy diet with lots of vegetables, fruits and fiber.  "Colorful" foods have a lot of vitamins (ie green vegetables, tomatoes, red peppers, etc).  Limit sweet tea, regular sodas and alcoholic beverages, all of which has a lot of calories and sugar.  Up to 1 alcoholic drink daily may be beneficial for women (unless trying to lose weight, watch sugars).  Drink a lot of water.  Calcium recommendations are 1200-1500 mg daily (1500 mg for postmenopausal women or women without ovaries), and vitamin D 1000 IU daily.  This should be obtained from diet and/or supplements (vitamins), and calcium should not be taken all at once, but in divided doses.  Monthly self breast exams and yearly mammograms for women over the age of 58 is recommended.  Sunscreen of at least SPF 30 should be used on all sun-exposed parts of the skin when outside between the hours of 10 am and 4 pm (not just when at beach or pool, but even with exercise, golf, tennis, and yard work!)  Use a sunscreen that says "broad spectrum" so it covers both UVA and UVB rays, and make sure to reapply every 1-2 hours.  Remember to change the batteries in your smoke detectors when changing your clock times in the spring and fall. Carbon monoxide detectors are recommended for your home.  Use your seat belt every time you are in a car, and please drive safely and not be distracted with cell phones and texting while driving.  You are due for colonoscopy in October 2025. If you don't hear from them by October/November, contact [facility]  to schedule appointment.  Remember not to use ibuprofen with diclofenac. They are both anti-inflammatories. You MAY use tylenol along with the diclofenac. Be sure to take diclofenac with food. The diclofenac can take a few days for the full anti-inflammatory effect to help, so feel free to use tylenol along with it, if needed. Only use the prednisone for a severe flare, with swelling, if it hasn't responded to diclofenac. Don't take both prednisone and diclofenac together (one or the other)..  Start taking coenzyme Q10 daily.  This may help decrease your muscle pains. We are rechecking your cholesterol today, which will likely be higher than last year, due to cutting back the dose of the statin to every other day.  Continue to limit cakes.  Try and use brown rice rather than white, and limit portion to no more than 1 cup.

## 2023-04-26 ENCOUNTER — Encounter: Payer: Self-pay | Admitting: Family Medicine

## 2023-04-26 ENCOUNTER — Ambulatory Visit: Payer: 59 | Admitting: Family Medicine

## 2023-04-26 VITALS — BP 108/68 | HR 72 | Ht 68.0 in | Wt 235.0 lb

## 2023-04-26 DIAGNOSIS — R7989 Other specified abnormal findings of blood chemistry: Secondary | ICD-10-CM

## 2023-04-26 DIAGNOSIS — E782 Mixed hyperlipidemia: Secondary | ICD-10-CM | POA: Diagnosis not present

## 2023-04-26 DIAGNOSIS — J45909 Unspecified asthma, uncomplicated: Secondary | ICD-10-CM

## 2023-04-26 DIAGNOSIS — E559 Vitamin D deficiency, unspecified: Secondary | ICD-10-CM | POA: Diagnosis not present

## 2023-04-26 DIAGNOSIS — Z23 Encounter for immunization: Secondary | ICD-10-CM | POA: Diagnosis not present

## 2023-04-26 DIAGNOSIS — Z0000 Encounter for general adult medical examination without abnormal findings: Secondary | ICD-10-CM

## 2023-04-26 DIAGNOSIS — I700 Atherosclerosis of aorta: Secondary | ICD-10-CM | POA: Diagnosis not present

## 2023-04-26 DIAGNOSIS — I10 Essential (primary) hypertension: Secondary | ICD-10-CM

## 2023-04-26 LAB — CBC WITH DIFFERENTIAL/PLATELET
Basophils Absolute: 0 10*3/uL (ref 0.0–0.2)
Basos: 1 %
EOS (ABSOLUTE): 0 10*3/uL (ref 0.0–0.4)
Eos: 0 %
Hematocrit: 40.7 % (ref 34.0–46.6)
Hemoglobin: 13.1 g/dL (ref 11.1–15.9)
Immature Grans (Abs): 0 10*3/uL (ref 0.0–0.1)
Immature Granulocytes: 0 %
Lymphocytes Absolute: 1.2 10*3/uL (ref 0.7–3.1)
Lymphs: 42 %
MCH: 28.4 pg (ref 26.6–33.0)
MCHC: 32.2 g/dL (ref 31.5–35.7)
MCV: 88 fL (ref 79–97)
Monocytes Absolute: 0.2 10*3/uL (ref 0.1–0.9)
Monocytes: 7 %
Neutrophils Absolute: 1.4 10*3/uL (ref 1.4–7.0)
Neutrophils: 50 %
Platelets: 140 10*3/uL — ABNORMAL LOW (ref 150–450)
RBC: 4.61 x10E6/uL (ref 3.77–5.28)
RDW: 13 % (ref 11.7–15.4)
WBC: 2.9 10*3/uL — ABNORMAL LOW (ref 3.4–10.8)

## 2023-04-26 LAB — CMP14+EGFR
ALT: 13 IU/L (ref 0–32)
AST: 19 IU/L (ref 0–40)
Albumin: 4.3 g/dL (ref 3.8–4.9)
Alkaline Phosphatase: 62 IU/L (ref 44–121)
BUN/Creatinine Ratio: 13 (ref 9–23)
BUN: 13 mg/dL (ref 6–24)
Bilirubin Total: 0.4 mg/dL (ref 0.0–1.2)
CO2: 22 mmol/L (ref 20–29)
Calcium: 9.6 mg/dL (ref 8.7–10.2)
Chloride: 102 mmol/L (ref 96–106)
Creatinine, Ser: 1.03 mg/dL — ABNORMAL HIGH (ref 0.57–1.00)
Globulin, Total: 3 g/dL (ref 1.5–4.5)
Glucose: 94 mg/dL (ref 70–99)
Potassium: 3.7 mmol/L (ref 3.5–5.2)
Sodium: 138 mmol/L (ref 134–144)
Total Protein: 7.3 g/dL (ref 6.0–8.5)
eGFR: 65 mL/min/{1.73_m2} (ref >59)

## 2023-04-26 LAB — LIPID PANEL
Chol/HDL Ratio: 4.2 ratio (ref 0.0–4.4)
Cholesterol, Total: 186 mg/dL (ref 100–199)
HDL: 44 mg/dL (ref >39)
LDL Chol Calc (NIH): 117 mg/dL — ABNORMAL HIGH (ref 0–99)
Triglycerides: 143 mg/dL (ref 0–149)
VLDL Cholesterol Cal: 25 mg/dL (ref 5–40)

## 2023-04-27 ENCOUNTER — Encounter: Payer: Self-pay | Admitting: Family Medicine

## 2023-04-27 ENCOUNTER — Other Ambulatory Visit (HOSPITAL_COMMUNITY): Payer: Self-pay

## 2023-04-27 MED ORDER — ROSUVASTATIN CALCIUM 10 MG PO TABS
10.0000 mg | ORAL_TABLET | Freq: Every day | ORAL | 3 refills | Status: AC
  Filled 2023-04-27 – 2023-07-19 (×2): qty 90, 90d supply, fill #0
  Filled 2024-01-11: qty 90, 90d supply, fill #1

## 2023-05-05 DIAGNOSIS — Z6834 Body mass index (BMI) 34.0-34.9, adult: Secondary | ICD-10-CM | POA: Diagnosis not present

## 2023-05-05 DIAGNOSIS — Z1231 Encounter for screening mammogram for malignant neoplasm of breast: Secondary | ICD-10-CM | POA: Diagnosis not present

## 2023-05-05 DIAGNOSIS — Z01419 Encounter for gynecological examination (general) (routine) without abnormal findings: Secondary | ICD-10-CM | POA: Diagnosis not present

## 2023-05-20 ENCOUNTER — Other Ambulatory Visit: Payer: Self-pay

## 2023-05-20 ENCOUNTER — Other Ambulatory Visit (HOSPITAL_COMMUNITY): Payer: Self-pay

## 2023-06-13 ENCOUNTER — Other Ambulatory Visit: Payer: Self-pay | Admitting: Family Medicine

## 2023-06-14 ENCOUNTER — Other Ambulatory Visit (HOSPITAL_COMMUNITY): Payer: Self-pay

## 2023-06-14 MED ORDER — FLUTICASONE PROPIONATE 50 MCG/ACT NA SUSP
1.0000 | Freq: Every day | NASAL | 2 refills | Status: AC
  Filled 2023-06-14 – 2023-07-19 (×2): qty 16, 60d supply, fill #0

## 2023-06-24 ENCOUNTER — Other Ambulatory Visit (HOSPITAL_COMMUNITY): Payer: Self-pay

## 2023-07-19 ENCOUNTER — Other Ambulatory Visit: Payer: Self-pay | Admitting: Family Medicine

## 2023-07-19 ENCOUNTER — Other Ambulatory Visit (HOSPITAL_COMMUNITY): Payer: Self-pay

## 2023-07-19 DIAGNOSIS — I10 Essential (primary) hypertension: Secondary | ICD-10-CM

## 2023-07-19 MED ORDER — LISINOPRIL-HYDROCHLOROTHIAZIDE 20-12.5 MG PO TABS
1.0000 | ORAL_TABLET | Freq: Every day | ORAL | 2 refills | Status: AC
  Filled 2023-07-19: qty 90, 90d supply, fill #0
  Filled 2023-10-19: qty 90, 90d supply, fill #1
  Filled 2024-01-11: qty 90, 90d supply, fill #2

## 2023-07-20 ENCOUNTER — Other Ambulatory Visit (HOSPITAL_COMMUNITY): Payer: Self-pay

## 2023-10-21 ENCOUNTER — Other Ambulatory Visit: Payer: Self-pay

## 2023-12-06 ENCOUNTER — Encounter: Payer: Self-pay | Admitting: Family Medicine

## 2023-12-06 ENCOUNTER — Ambulatory Visit: Admitting: Family Medicine

## 2023-12-06 ENCOUNTER — Other Ambulatory Visit (HOSPITAL_COMMUNITY): Payer: Self-pay

## 2023-12-06 VITALS — BP 126/70 | HR 78 | Temp 97.0°F | Ht 68.0 in | Wt 247.2 lb

## 2023-12-06 DIAGNOSIS — R350 Frequency of micturition: Secondary | ICD-10-CM | POA: Diagnosis not present

## 2023-12-06 DIAGNOSIS — M7062 Trochanteric bursitis, left hip: Secondary | ICD-10-CM | POA: Diagnosis not present

## 2023-12-06 DIAGNOSIS — H40012 Open angle with borderline findings, low risk, left eye: Secondary | ICD-10-CM | POA: Diagnosis not present

## 2023-12-06 DIAGNOSIS — H0288B Meibomian gland dysfunction left eye, upper and lower eyelids: Secondary | ICD-10-CM | POA: Diagnosis not present

## 2023-12-06 DIAGNOSIS — H11051 Peripheral pterygium, progressive, right eye: Secondary | ICD-10-CM | POA: Diagnosis not present

## 2023-12-06 DIAGNOSIS — H0288A Meibomian gland dysfunction right eye, upper and lower eyelids: Secondary | ICD-10-CM | POA: Diagnosis not present

## 2023-12-06 DIAGNOSIS — R8290 Unspecified abnormal findings in urine: Secondary | ICD-10-CM

## 2023-12-06 LAB — POCT URINALYSIS DIP (PROADVANTAGE DEVICE)
Bilirubin, UA: NEGATIVE
Blood, UA: NEGATIVE
Glucose, UA: NEGATIVE mg/dL
Nitrite, UA: NEGATIVE
Specific Gravity, Urine: 1.01
Urobilinogen, Ur: 0.2
pH, UA: 6 (ref 5.0–8.0)

## 2023-12-06 MED ORDER — IBUPROFEN 800 MG PO TABS
800.0000 mg | ORAL_TABLET | Freq: Three times a day (TID) | ORAL | 0 refills | Status: AC | PRN
  Filled 2023-12-06: qty 45, 15d supply, fill #0

## 2023-12-06 NOTE — Patient Instructions (Signed)
 Your urine does not suggest any infection. You may be noticing more of an odor when the urine is more concentrated. Be sure to drink plenty of water. The more yellow/darker your urine is, the more you need to drink (non-alcoholic, non-caffeinated beverage). If you notice burning or pain with urination, we should get another urine specimen (since today's wasn't enough to send to the lab to further evaluate).  Asparagus and some vitamins can affect the odor of the urine. Stay away from asparagus in the short-term, just so that you know if the odor resolves. Expect an abnormal odor when you eat it (many people avoid eating it for this very reason).   You elected to do an oral course of anti-inflammatory (800 mg ibuprofen ) rather than an injection for your hip bursitis.  Take this with food, three times daily for 7-10 days (up to a full 2 weeks if needed). Stop when you symptoms are completely better. If they do not improve, return for a cortisone injection.

## 2023-12-06 NOTE — Progress Notes (Signed)
 Chief Complaint  Patient presents with   other    Urinary issues, feel like need to urinate, at night she has a smell- bursitis on left side flaring up again.   Patient sent the following message this morning: For the past four days, my urine have a very high odor especially at night with am up using the bathroom. no burning, no pain just the Odor.  She also reports urinary frequency. She now notes the urine odor only at night (noticed during the day only on the 2nd day of symptoms when wasn't drinking as much). Denies dysuria, abdominal or flank pain. Urine was noted to be very yellow, tends to be much lighter during the day.  Denies any change in diet or vitamin/supplements. On direct questions, she recalls that she had asparagus Friday night--noted the odor worse on Saturday and Sunday, but thinks the odor was noted PRIOR to eating the asparagus. The odor was worse last night, and she hadn't had any asparagus since 2 nights prior.  H/o hip bursitis.  Previously home exercise regimen had helped, and also previously treated with ibuprofen  800mg . She is requesting ibuprofen  refill. She took some OTC Advil  last week (Thursday), just for 1 day, and helped some.   PMH, PSH, SH reviewed  Outpatient Encounter Medications as of 12/06/2023  Medication Sig Note   acetaminophen  (TYLENOL ) 500 MG tablet Take 1,000 mg by mouth every 6 (six) hours as needed. 04/26/2023: As needed   albuterol  (VENTOLIN  HFA) 108 (90 Base) MCG/ACT inhaler Inhale 2 puffs into the lungs every 6 (six) hours as needed for wheezing 04/26/2023: As needed   Ascorbic Acid (VITAMIN C) 1000 MG tablet Take 1,000 mg by mouth daily.    cholecalciferol (VITAMIN D ) 1000 UNITS tablet Take 1,000 Units by mouth daily. Reported on 07/22/2015    fexofenadine (ALLEGRA) 180 MG tablet Take 180 mg by mouth at bedtime. Reported on 07/22/2015    fish oil-omega-3 fatty acids 1000 MG capsule Take 1 g by mouth daily. Reported on 07/22/2015 04/26/2023: One  daily   fluticasone  (FLONASE ) 50 MCG/ACT nasal spray Place 1 spray into both nostrils daily. (Patient taking differently: Place 1 spray into both nostrils as needed.)    lisinopril -hydrochlorothiazide  (ZESTORETIC ) 20-12.5 MG tablet Take 1 tablet by mouth daily.    Multiple Minerals-Vitamins (CALCIUM  CITRATE PLUS/MAGNESIUM PO) Take 1 tablet by mouth daily.    rosuvastatin  (CRESTOR ) 10 MG tablet Take 1 tablet (10 mg total) by mouth daily.    valACYclovir  (VALTREX ) 1000 MG tablet Take 1 tablet (1,000 mg total) by mouth daily. (Patient taking differently: Take 1,000 mg by mouth as needed.) 04/26/2023: As needed for cold sores   vitamin B-12 (CYANOCOBALAMIN) 1000 MCG tablet Take 1,000 mcg by mouth daily.    [DISCONTINUED] diclofenac  (VOLTAREN ) 75 MG EC tablet Take 1 tablet (75 mg total) by mouth 2 (two) times daily. (Patient not taking: Reported on 12/06/2023) 04/26/2023: As needed   No facility-administered encounter medications on file as of 12/06/2023.   Allergies  Allergen Reactions   Nexium [Esomeprazole Magnesium] Hives, Itching and Swelling   Penicillins Hives   Percocet [Oxycodone -Acetaminophen ] Itching    ROS: No f/c, URI symptoms, n/v/d, abdominal pain. No dysuria.  Urinary frequency and odor per HPI. L hip bursitis has been flaring, hurts to sleep on L side at night. Also has pain after sitting for prolonged periods. Denies back pain, sciatica. See HPI   PHYSICAL EXAM:  BP 126/70   Pulse 78   Ht 5' 8 (  1.727 m)   Wt 247 lb 3.2 oz (112.1 kg)   LMP 11/07/2012   SpO2 98%   BMI 37.59 kg/m   Wt Readings from Last 3 Encounters:  12/06/23 247 lb 3.2 oz (112.1 kg)  04/26/23 235 lb (106.6 kg)  05/01/22 226 lb (102.5 kg)   Pleasant, well-appearing female in no distress HEENT: conjunctiva and sclera are clear, EOMI, OP clear Neck: no lymphadenopathy or mass Heart: regular rate and rhythm Lungs: clear bilaterally Back: no spinal or CVA tenderness Abdomen: soft, nontender, no  mass Extremities: tender at L trochanteric bursa. Nontender along ITB, and no discomfort with IT band stretch. Psych: normal mood, affect, hygiene and grooming Neuro: alert and oriented, cranial nerves grossly intact, normal gait.  Urine dip: SG 1.010, trace leuks, trace protein, otherwise negative.   ASSESSMENT/PLAN:  Abnormal urine odor - ddx reviewed. only trace leuks on urine dip, not enough volume for micro or culture. To hydrate and avoid asparagus. f/u if sx persist/worsens - Plan: POCT Urinalysis DIP (Proadvantage Device), CANCELED: Urinalysis, microscopic only  Urinary frequency - Plan: POCT Urinalysis DIP (Proadvantage Device), CANCELED: Urinalysis, microscopic only  Trochanteric bursitis of left hip - discussed tx options, including injection. Prefers ibuprofen  800 mg TID; reviewed risks/SE, how to properly take. f/u if sx persist/worsen, for injection - Plan: ibuprofen  (ADVIL ) 800 MG tablet    Your urine does not suggest any infection. You may be noticing more of an odor when the urine is more concentrated. Be sure to drink plenty of water. The more yellow/darker your urine is, the more you need to drink (non-alcoholic, non-caffeinated beverage). If you notice burning or pain with urination, we should get another urine specimen (since today's wasn't enough to send to the lab to further evaluate).  Asparagus and some vitamins can affect the odor of the urine. Stay away from asparagus in the short-term, just so that you know if the odor resolves. Expect an abnormal odor when you eat it (many people avoid eating it for this very reason).   You elected to do an oral course of anti-inflammatory (800 mg ibuprofen ) rather than an injection for your hip bursitis.  Take this with food, three times daily for 7-10 days (up to a full 2 weeks if needed). Stop when you symptoms are completely better. If they do not improve, return for a cortisone injection.

## 2023-12-20 ENCOUNTER — Encounter: Payer: Self-pay | Admitting: Radiology

## 2024-01-11 ENCOUNTER — Other Ambulatory Visit (HOSPITAL_COMMUNITY): Payer: Self-pay

## 2024-01-11 NOTE — Progress Notes (Signed)
 Ellouise Console, PA-C [address] [place], [place]  [address] Phone: [phone]   Gastroenterology Consultation  Referring Provider:     Randol Dawes, MD Primary Care Physician:  Randol Dawes, MD Primary Gastroenterologist:  Ellouise Console, PA-C / Dr. Leigh Reason for Consultation:     Discuss EGD and colonoscopy        HPI:   Discussed the use of AI scribe software for clinical note transcription with the patient, who gave verbal consent to proceed.  11/2018 last colonoscopy by Dr. Leigh: No polyps.  Torturous colon.  Internal hemorrhoids.  Good prep.  5-year repeat due to family history.  She has family history of colon polyps in multiple first-degree relatives (mother age 53s, sister).  No previous EGD.  History of Present Illness She has been experiencing significant heartburn and dysphagia, with a burning sensation in her stomach and chest that radiates to her back. These symptoms primarily occur during meals, and she sometimes feels as though food is stuck, requiring water to help it go down. These symptoms have been present for at least a year and a half, with worsening over the past seven months. The sensation of food getting stuck has been noted for the past three months.  She has been using Tums for relief, which provides some help, but she has not been on any prescription medication for acid reflux. She recalls a past incident where she took a medication Nexium that caused hives.  She cannot take Nexium.  No vomiting of food or food bolus episodes. She experiences heartburn at night, especially when lying on her stomach, and acknowledges that she does not avoid eating or drinking two to three hours before bedtime unless dining out late.  04/2023 labs showed normal hemoglobin 13.1.  No anemia.  PMH: Hypertension, allergies, asthma, hyperlipidemia, vitamin D  deficiency, hysterectomy.    Past Medical History:  Diagnosis Date   Allergic rhinitis, cause  unspecified    Allergy    Arthritis    bursitis in hips, recently dx with avascular necrosis femor head   Asthma with allergic rhinitis    rarely uses inhaler unless she gets sick   Essential hypertension, benign    Heart murmur    Mixed hyperlipidemia    no meds, diet controlled   Positive PPD    h/o BCG vaccine, negative chest xray   SVD (spontaneous vaginal delivery)    x 2   Unspecified vitamin D  deficiency     Past Surgical History:  Procedure Laterality Date   ENDOMETRIAL ABLATION  2007   Dr. Marget   LAPAROSCOPIC ASSISTED VAGINAL HYSTERECTOMY N/A 06/12/2013   Procedure: LAPAROSCOPIC ASSISTED VAGINAL HYSTERECTOMY;  Surgeon: Alm JAYSON Marget, MD;  Location: [facility];  Service: Gynecology;  Laterality: N/A;   LAPAROSCOPIC BILATERAL SALPINGECTOMY  06/12/2013   Procedure: LAPAROSCOPIC BILATERAL SALPINGECTOMY;  Surgeon: Alm JAYSON Marget, MD;  Location: [facility];  Service: Gynecology;;   TUBAL LIGATION     WISDOM TOOTH EXTRACTION     right bottom lower tooth    Prior to Admission medications   Medication Sig Start Date End Date Taking? Authorizing Provider  acetaminophen  (TYLENOL ) 500 MG tablet Take 1,000 mg by mouth every 6 (six) hours as needed.    [provider]  albuterol  (VENTOLIN  HFA) 108 (90 Base) MCG/ACT inhaler Inhale 2 puffs into the lungs every 6 (six) hours as needed for wheezing 04/14/23   Randol Dawes, MD  Ascorbic Acid (VITAMIN C) 1000 MG tablet  Take 1,000 mg by mouth daily.    [provider]  cholecalciferol (VITAMIN D ) 1000 UNITS tablet Take 1,000 Units by mouth daily. Reported on 07/22/2015    [provider]  fexofenadine (ALLEGRA) 180 MG tablet Take 180 mg by mouth at bedtime. Reported on 07/22/2015    [provider]  fish oil-omega-3 fatty acids 1000 MG capsule Take 1 g by mouth daily. Reported on 07/22/2015    [provider]  fluticasone  (FLONASE ) 50 MCG/ACT nasal spray Place 1 spray into both nostrils daily. Patient taking  differently: Place 1 spray into both nostrils as needed. 06/14/23   Randol Dawes, MD  ibuprofen  (ADVIL ) 800 MG tablet Take 1 tablet (800 mg total) by mouth every 8 (eight) hours as needed. Take with food. Take until symptoms have completely resolved. 12/06/23   Randol Dawes, MD  lisinopril -hydrochlorothiazide  (ZESTORETIC ) 20-12.5 MG tablet Take 1 tablet by mouth daily. 07/19/23   Randol Dawes, MD  Multiple Minerals-Vitamins (CALCIUM  CITRATE PLUS/MAGNESIUM PO) Take 1 tablet by mouth daily.    [provider]  rosuvastatin  (CRESTOR ) 10 MG tablet Take 1 tablet (10 mg total) by mouth daily. 04/27/23   Randol Dawes, MD  valACYclovir  (VALTREX ) 1000 MG tablet Take 1 tablet (1,000 mg total) by mouth daily. Patient taking differently: Take 1,000 mg by mouth as needed. 04/10/22     vitamin B-12 (CYANOCOBALAMIN) 1000 MCG tablet Take 1,000 mcg by mouth daily.    [provider]    Family History  Problem Relation Age of Onset   Diabetes Mother    Hypertension Mother    Allergies Mother    Colon polyps Mother 84   Kidney disease Mother        on transplant list; started dialysis 2022   Heart disease Father 77       MI   Hyperlipidemia Father    Hypertension Father    Lung cancer Father 32       smoker   Hypertension Sister    Diabetes Sister        borderline   Colon polyps Sister    Hypothyroidism Sister    Rheum arthritis Maternal Grandmother    Breast cancer Paternal Grandmother    Rheum arthritis Paternal Grandmother    Breast cancer Cousin 53   Cancer Cousin        rare blood cancer and multiple myeloma   Multiple myeloma Cousin    Colon cancer Neg Hx    Esophageal cancer Neg Hx    Liver disease Neg Hx    Stomach cancer Neg Hx    Rectal cancer Neg Hx      Social History   Tobacco Use   Smoking status: Never   Smokeless tobacco: Never  Vaping Use   Vaping status: Never Used  Substance Use Topics   Alcohol use: Yes    Comment: occasional (if goes out with friends, not  even weekly)   Drug use: No    Allergies as of 01/12/2024 - Review Complete 01/12/2024  Allergen Reaction Noted   Nexium [esomeprazole magnesium] Hives, Itching, and Swelling 05/02/2014   Penicillins Hives 12/01/2010   Percocet [oxycodone -acetaminophen ] Itching 12/01/2010    Review of Systems:    All systems reviewed and negative except where noted in HPI.   Physical Exam:  BP 110/70   Pulse 71   Ht 5' 8 (1.727 m)   Wt 246 lb 8 oz (111.8 kg)   LMP 11/07/2012   BMI 37.48 kg/m  Patient's last menstrual period was 11/07/2012.  General:   Alert,  Well-developed, well-nourished, pleasant and cooperative in NAD Lungs:  Respirations even and unlabored.  Clear throughout to auscultation.   No wheezes, crackles, or rhonchi. No acute distress. Heart:  Regular rate and rhythm; no murmurs, clicks, rubs, or gallops. Abdomen:  Normal bowel sounds.  No bruits.  Soft, and non-distended without masses, hepatosplenomegaly or hernias noted.  No Tenderness.  No guarding or rebound tenderness.    Neurologic:  Alert and oriented x3;  grossly normal neurologically. Psych:  Alert and cooperative. Normal mood and affect.   Imaging Studies: No results found.  Labs: CBC    Component Value Date/Time   WBC 2.9 (L) 04/26/2023 0952   WBC 4.4 02/11/2021 0815   RBC 4.61 04/26/2023 0952   RBC 4.33 02/11/2021 0815   HGB 13.1 04/26/2023 0952   HCT 40.7 04/26/2023 0952   PLT 140 (L) 04/26/2023 0952   MCV 88 04/26/2023 0952    CMP     Component Value Date/Time   NA 138 04/26/2023 0952   K 3.7 04/26/2023 0952   CL 102 04/26/2023 0952   CO2 22 04/26/2023 0952   GLUCOSE 94 04/26/2023 0952   GLUCOSE 96 02/11/2021 0815   BUN 13 04/26/2023 0952   CREATININE 1.03 (H) 04/26/2023 0952   CREATININE 1.00 12/30/2016 1046   CALCIUM  9.6 04/26/2023 0952   PROT 7.3 04/26/2023 0952   ALBUMIN 4.3 04/26/2023 0952   AST 19 04/26/2023 0952   ALT 13 04/26/2023 0952   ALKPHOS 62 04/26/2023 0952   BILITOT 0.4  04/26/2023 0952   GFRNONAA >60 02/11/2021 0815   GFRAA 61 01/30/2020 0850    Assessment and Plan:   Jolee A Sellinger is a 53 y.o. y/o female has been referred for: Assessment & Plan 1.  Gastroesophageal reflux disease with solid food dysphagia Chronic GERD with worsening symptoms and dysphagia, suggestive of possible lower esophageal stricture. Previous adverse reaction to Tagamet. - Prescribed pantoprazole  40mg , one tablet daily before breakfast. - Provided list of foods and drinks to avoid. Avoid coffee, sodas, peppermint, garlic, onions, alcohol, citrus fruits, chocolate, tomatoes, fatty and spicey foods.   - Advised not to eat or drink 2-3 hours before lying down. - Recommended wedge pillow for head elevation during sleep. - Continue Tums for breakthrough heartburn. - Scheduling EGD with dilation with Dr. Leigh I discussed risks of EGD with patient to include risk of bleeding, perforation, and risk of sedation.  Patient expressed understanding and agrees to proceed with EGD.   2.  Family history of colon polyps: Mother age 85s, sister.  Multiple first-degree relatives. Family history of colon polyps with last colonoscopy in October 2020 showing no polyps. Recommended repeat every five years. - Scheduling Colonoscopy I discussed risks of colonoscopy with patient to include risk of bleeding, colon perforation, and risk of sedation.  Patient expressed understanding and agrees to proceed with colonoscopy.    Follow up 4 weeks after EGD to follow-up with acid reflux and dysphagia.  Ellouise Console, PA-C

## 2024-01-12 ENCOUNTER — Encounter (HOSPITAL_COMMUNITY): Payer: Self-pay

## 2024-01-12 ENCOUNTER — Encounter: Payer: Self-pay | Admitting: Physician Assistant

## 2024-01-12 ENCOUNTER — Ambulatory Visit: Admitting: Physician Assistant

## 2024-01-12 ENCOUNTER — Other Ambulatory Visit (HOSPITAL_COMMUNITY): Payer: Self-pay

## 2024-01-12 VITALS — BP 110/70 | HR 71 | Ht 68.0 in | Wt 246.5 lb

## 2024-01-12 DIAGNOSIS — R1310 Dysphagia, unspecified: Secondary | ICD-10-CM

## 2024-01-12 DIAGNOSIS — K219 Gastro-esophageal reflux disease without esophagitis: Secondary | ICD-10-CM

## 2024-01-12 DIAGNOSIS — Z83719 Family history of colon polyps, unspecified: Secondary | ICD-10-CM

## 2024-01-12 MED ORDER — PANTOPRAZOLE SODIUM 40 MG PO TBEC
40.0000 mg | DELAYED_RELEASE_TABLET | Freq: Every day | ORAL | 3 refills | Status: AC
  Filled 2024-01-12: qty 90, 90d supply, fill #0

## 2024-01-12 MED ORDER — NA SULFATE-K SULFATE-MG SULF 17.5-3.13-1.6 GM/177ML PO SOLN
1.0000 | Freq: Once | ORAL | 0 refills | Status: AC
  Filled 2024-01-12: qty 354, 2d supply, fill #0
  Filled 2024-02-02: qty 354, 1d supply, fill #0

## 2024-01-12 NOTE — Patient Instructions (Addendum)
 We have sent the following medications to your pharmacy for you to pick up at your convenience: Pantoprazole  40 mg once daily  You have been scheduled for a Colonoscopy. Please follow written instructions given to you at your visit today.   If you use inhalers (even only as needed), please bring them with you on the day of your procedure.  DO NOT TAKE 7 DAYS PRIOR TO TEST- Trulicity (dulaglutide) Ozempic, Wegovy (semaglutide) Mounjaro (tirzepatide) Bydureon Bcise (exanatide extended release)  DO NOT TAKE 1 DAY PRIOR TO YOUR TEST Rybelsus (semaglutide) Adlyxin (lixisenatide) Victoza (liraglutide) Byetta (exanatide) ___________________________________________________________________________  Please follow up sooner if symptoms increase or worsen   Due to recent changes in healthcare laws, you may see the results of your imaging and laboratory studies on MyChart before your provider has had a chance to review them.  We understand that in some cases there may be results that are confusing or concerning to you. Not all laboratory results come back in the same time frame and the provider may be waiting for multiple results in order to interpret others.  Please give us  48 hours in order for your provider to thoroughly review all the results before contacting the office for clarification of your results.   Thank you for trusting me with your gastrointestinal care!   Ellouise Console, PA-C _______________________________________________________  If your blood pressure at your visit was 140/90 or greater, please contact your primary care physician to follow up on this.  _______________________________________________________  If you are age 54 or older, your body mass index should be between 23-30. Your Body mass index is 37.48 kg/m. If this is out of the aforementioned range listed, please consider follow up with your Primary Care Provider.  If you are age 54 or younger, your body mass index  should be between 19-25. Your Body mass index is 37.48 kg/m. If this is out of the aformentioned range listed, please consider follow up with your Primary Care Provider.   ________________________________________________________  The [facility] providers would like to encourage you to use MYCHART to communicate with providers for non-urgent requests or questions.  Due to long hold times on the telephone, sending your provider a message by [facility] may be a faster and more efficient way to get a response.  Please allow 48 business hours for a response.  Please remember that this is for non-urgent requests.  _______________________________________________________

## 2024-01-13 NOTE — Progress Notes (Signed)
 Agree with assessment and plan as outlined.

## 2024-01-15 ENCOUNTER — Encounter: Payer: Self-pay | Admitting: Gastroenterology

## 2024-01-21 ENCOUNTER — Other Ambulatory Visit (HOSPITAL_COMMUNITY): Payer: Self-pay

## 2024-02-02 ENCOUNTER — Other Ambulatory Visit (HOSPITAL_COMMUNITY): Payer: Self-pay

## 2024-03-01 ENCOUNTER — Encounter: Payer: Self-pay | Admitting: Gastroenterology

## 2024-03-06 ENCOUNTER — Encounter: Admitting: Gastroenterology

## 2024-03-08 ENCOUNTER — Encounter: Payer: Self-pay | Admitting: Gastroenterology

## 2024-03-08 ENCOUNTER — Ambulatory Visit: Admitting: Gastroenterology

## 2024-03-08 VITALS — BP 129/80 | HR 63 | Temp 97.2°F | Resp 13 | Ht 68.0 in | Wt 246.0 lb

## 2024-03-08 DIAGNOSIS — Q439 Congenital malformation of intestine, unspecified: Secondary | ICD-10-CM

## 2024-03-08 DIAGNOSIS — K648 Other hemorrhoids: Secondary | ICD-10-CM | POA: Diagnosis not present

## 2024-03-08 DIAGNOSIS — Z83719 Family history of colon polyps, unspecified: Secondary | ICD-10-CM

## 2024-03-08 DIAGNOSIS — K449 Diaphragmatic hernia without obstruction or gangrene: Secondary | ICD-10-CM

## 2024-03-08 DIAGNOSIS — Z1211 Encounter for screening for malignant neoplasm of colon: Secondary | ICD-10-CM

## 2024-03-08 DIAGNOSIS — K219 Gastro-esophageal reflux disease without esophagitis: Secondary | ICD-10-CM

## 2024-03-08 DIAGNOSIS — R1310 Dysphagia, unspecified: Secondary | ICD-10-CM

## 2024-03-08 DIAGNOSIS — K2950 Unspecified chronic gastritis without bleeding: Secondary | ICD-10-CM | POA: Diagnosis not present

## 2024-03-08 MED ORDER — SODIUM CHLORIDE 0.9 % IV SOLN: 500.0000 mL | Freq: Once | INTRAVENOUS | Status: AC

## 2024-03-08 NOTE — Patient Instructions (Signed)
 Discharge instructions given. Handouts on Hemorrhoids and Hiatal Hernia. Resume previous medications. YOU HAD AN ENDOSCOPIC PROCEDURE TODAY AT THE [facility]:   Refer to the procedure report that was given to you for any specific questions about what was found during the examination.  If the procedure report does not answer your questions, please call your gastroenterologist to clarify.  If you requested that your care partner not be given the details of your procedure findings, then the procedure report has been included in a sealed envelope for you to review at your convenience later.  YOU SHOULD EXPECT: Some feelings of bloating in the abdomen. Passage of more gas than usual.  Walking can help get rid of the air that was put into your GI tract during the procedure and reduce the bloating. If you had a lower endoscopy (such as a colonoscopy or flexible sigmoidoscopy) you may notice spotting of blood in your stool or on the toilet paper. If you underwent a bowel prep for your procedure, you may not have a normal bowel movement for a few days.  Please Note:  You might notice some irritation and congestion in your nose or some drainage.  This is from the oxygen used during your procedure.  There is no need for concern and it should clear up in a day or so.  SYMPTOMS TO REPORT IMMEDIATELY:  Following lower endoscopy (colonoscopy or flexible sigmoidoscopy):  Excessive amounts of blood in the stool  Significant tenderness or worsening of abdominal pains  Swelling of the abdomen that is new, acute  Fever of 100F or higher   For urgent or emergent issues, a gastroenterologist can be reached at any hour by calling [phone]. Do not use MyChart messaging for urgent concerns.    DIET:  We do recommend a small meal at first, but then you may proceed to your regular diet.  Drink plenty of fluids but you should avoid alcoholic beverages for 24 hours.  ACTIVITY:  You should plan to  take it easy for the rest of today and you should NOT DRIVE or use heavy machinery until tomorrow (because of the sedation medicines used during the test).    FOLLOW UP: Our staff will call the number listed on your records the next business day following your procedure.  We will call around 7:15- 8:00 am to check on you and address any questions or concerns that you may have regarding the information given to you following your procedure. If we do not reach you, we will leave a message.     If any biopsies were taken you will be contacted by phone or by letter within the next 1-3 weeks.  Please call us  at [phone] if you have not heard about the biopsies in 3 weeks.    SIGNATURES/CONFIDENTIALITY: You and/or your care partner have signed paperwork which will be entered into your electronic medical record.  These signatures attest to the fact that that the information above on your After Visit Summary has been reviewed and is understood.  Full responsibility of the confidentiality of this discharge information lies with you and/or your care-partner.

## 2024-03-08 NOTE — Progress Notes (Signed)
 Pt's states no medical or surgical changes since previsit or office visit.

## 2024-03-08 NOTE — Progress Notes (Signed)
 Sedate, gd SR, tolerated procedure well, VSS, report to RN

## 2024-03-08 NOTE — Progress Notes (Signed)
 Called to room to assist during endoscopic procedure.  Patient ID and intended procedure confirmed with present staff. Received instructions for my participation in the procedure from the performing physician.

## 2024-03-08 NOTE — Progress Notes (Signed)
 [facility] History and Physical   Primary Care Physician:  Randol Dawes, MD   Reason for Procedure:   Family history of colon polyps, GERD / dysphagia  Plan:    Colonoscopy + EGD and possible dilation     HPI: Candace Walker is a 54 y.o. female  here for EGD and colonoscopy - history of GERD and dysphagia, now on protonix  40mg  since end of November. First time EGD. Last colonoscopy 11/2018 was normal. Mother and sister had polyps in 31s, close surveillance follow up.    Patient denies any bowel symptoms at this time. Otherwise feels well without any cardiopulmonary symptoms.   I have discussed risks / benefits of anesthesia and endoscopic procedure with Candace Walker and they wish to proceed with the exams as outlined today.   The patient was provided an opportunity to ask questions and all were answered. The patient agreed with the plan.    Past Medical History:  Diagnosis Date   Allergic rhinitis, cause unspecified    Allergy    Arthritis    bursitis in hips, recently dx with avascular necrosis femor head   Asthma with allergic rhinitis    rarely uses inhaler unless she gets sick   Essential hypertension, benign    Heart murmur    Mixed hyperlipidemia    no meds, diet controlled   Positive PPD    h/o BCG vaccine, negative chest xray   SVD (spontaneous vaginal delivery)    x 2   Unspecified vitamin D  deficiency     Past Surgical History:  Procedure Laterality Date   ENDOMETRIAL ABLATION  2007   Dr. Marget   LAPAROSCOPIC ASSISTED VAGINAL HYSTERECTOMY N/A 06/12/2013   Procedure: LAPAROSCOPIC ASSISTED VAGINAL HYSTERECTOMY;  Surgeon: Alm JAYSON Marget, MD;  Location: [facility];  Service: Gynecology;  Laterality: N/A;   LAPAROSCOPIC BILATERAL SALPINGECTOMY  06/12/2013   Procedure: LAPAROSCOPIC BILATERAL SALPINGECTOMY;  Surgeon: Alm JAYSON Marget, MD;  Location: [facility];  Service: Gynecology;;   TUBAL LIGATION     WISDOM TOOTH EXTRACTION     right bottom lower tooth     Prior to Admission medications  Medication Sig Start Date End Date Taking? Authorizing Provider  Ascorbic Acid (VITAMIN C) 1000 MG tablet Take 1,000 mg by mouth daily.   Yes [provider]  cholecalciferol (VITAMIN D ) 1000 UNITS tablet Take 1,000 Units by mouth daily. Reported on 07/22/2015   Yes [provider]  fish oil-omega-3 fatty acids 1000 MG capsule Take 1 g by mouth daily. Reported on 07/22/2015   Yes [provider]  lisinopril -hydrochlorothiazide  (ZESTORETIC ) 20-12.5 MG tablet Take 1 tablet by mouth daily. 07/19/23  Yes Randol Dawes, MD  Multiple Minerals-Vitamins (CALCIUM  CITRATE PLUS/MAGNESIUM PO) Take 1 tablet by mouth daily.   Yes [provider]  rosuvastatin  (CRESTOR ) 10 MG tablet Take 1 tablet (10 mg total) by mouth daily. 04/27/23  Yes Randol Dawes, MD  vitamin B-12 (CYANOCOBALAMIN) 1000 MCG tablet Take 1,000 mcg by mouth daily.   Yes [provider]  acetaminophen  (TYLENOL ) 500 MG tablet Take 1,000 mg by mouth every 6 (six) hours as needed.    [provider]  albuterol  (VENTOLIN  HFA) 108 (90 Base) MCG/ACT inhaler Inhale 2 puffs into the lungs every 6 (six) hours as needed for wheezing 04/14/23   Randol Dawes, MD  diclofenac  (VOLTAREN ) 75 MG EC tablet Take 1 tablet by mouth 2 (two) times daily.    [provider]  fexofenadine (ALLEGRA) 180 MG tablet  Take 180 mg by mouth at bedtime. Reported on 07/22/2015    [provider]  fluticasone  (FLONASE ) 50 MCG/ACT nasal spray Place 1 spray into both nostrils daily. Patient taking differently: Place 1 spray into both nostrils as needed. 06/14/23   Randol Dawes, MD  ibuprofen  (ADVIL ) 800 MG tablet Take 1 tablet (800 mg total) by mouth every 8 (eight) hours as needed. Take with food. Take until symptoms have completely resolved. 12/06/23   Randol Dawes, MD  pantoprazole  (PROTONIX ) 40 MG tablet Take 1 tablet (40 mg total) by mouth daily. 01/12/24   [place], PA-C    Current  Outpatient Medications  Medication Sig Dispense Refill   Ascorbic Acid (VITAMIN C) 1000 MG tablet Take 1,000 mg by mouth daily.     cholecalciferol (VITAMIN D ) 1000 UNITS tablet Take 1,000 Units by mouth daily. Reported on 07/22/2015     fish oil-omega-3 fatty acids 1000 MG capsule Take 1 g by mouth daily. Reported on 07/22/2015     lisinopril -hydrochlorothiazide  (ZESTORETIC ) 20-12.5 MG tablet Take 1 tablet by mouth daily. 90 tablet 2   Multiple Minerals-Vitamins (CALCIUM  CITRATE PLUS/MAGNESIUM PO) Take 1 tablet by mouth daily.     rosuvastatin  (CRESTOR ) 10 MG tablet Take 1 tablet (10 mg total) by mouth daily. 90 tablet 3   vitamin B-12 (CYANOCOBALAMIN) 1000 MCG tablet Take 1,000 mcg by mouth daily.     acetaminophen  (TYLENOL ) 500 MG tablet Take 1,000 mg by mouth every 6 (six) hours as needed.     albuterol  (VENTOLIN  HFA) 108 (90 Base) MCG/ACT inhaler Inhale 2 puffs into the lungs every 6 (six) hours as needed for wheezing 6.7 g 0   diclofenac  (VOLTAREN ) 75 MG EC tablet Take 1 tablet by mouth 2 (two) times daily.     fexofenadine (ALLEGRA) 180 MG tablet Take 180 mg by mouth at bedtime. Reported on 07/22/2015     fluticasone  (FLONASE ) 50 MCG/ACT nasal spray Place 1 spray into both nostrils daily. (Patient taking differently: Place 1 spray into both nostrils as needed.) 48 g 2   ibuprofen  (ADVIL ) 800 MG tablet Take 1 tablet (800 mg total) by mouth every 8 (eight) hours as needed. Take with food. Take until symptoms have completely resolved. 45 tablet 0   pantoprazole  (PROTONIX ) 40 MG tablet Take 1 tablet (40 mg total) by mouth daily. 90 tablet 3   Current Facility-Administered Medications  Medication Dose Route Frequency Provider Last Rate Last Admin   0.9 %  sodium chloride  infusion  500 mL Intravenous Once Jakeim Sedore, Elspeth SQUIBB, MD        Allergies as of 03/08/2024 - Review Complete 03/08/2024  Allergen Reaction Noted   Nexium [esomeprazole magnesium] Hives, Itching, and Swelling 05/02/2014    Penicillins Hives 12/01/2010   Percocet [oxycodone -acetaminophen ] Itching 12/01/2010    Family History  Problem Relation Age of Onset   Diabetes Mother    Hypertension Mother    Allergies Mother    Colon polyps Mother 23   Kidney disease Mother        on transplant list; started dialysis 2022   Heart disease Father 10       MI   Hyperlipidemia Father    Hypertension Father    Lung cancer Father 35       smoker   Hypertension Sister    Diabetes Sister        borderline   Colon polyps Sister    Hypothyroidism Sister    Rheum arthritis Maternal Grandmother  Breast cancer Paternal Grandmother    Rheum arthritis Paternal Grandmother    Breast cancer Cousin 37   Cancer Cousin        rare blood cancer and multiple myeloma   Multiple myeloma Cousin    Colon cancer Neg Hx    Esophageal cancer Neg Hx    Liver disease Neg Hx    Stomach cancer Neg Hx    Rectal cancer Neg Hx     Social History   Socioeconomic History   Marital status: Divorced    Spouse name: Not on file   Number of children: 2   Years of education: Not on file   Highest education level: Not on file  Occupational History   Occupation: Curator: [employer]  Tobacco Use   Smoking status: Never   Smokeless tobacco: Never  Vaping Use   Vaping status: Never Used  Substance and Sexual Activity   Alcohol use: Yes    Comment: occasional (if goes out with friends, not even weekly)   Drug use: No   Sexual activity: Not Currently    Birth control/protection: Surgical    Comment: ablation and tubal ligation and hysterectomy  Other Topics Concern   Not on file  Social History Narrative   Lives with her parents, no pets. Divorced from her husband.     Daughter is in the army, living in [place]--moving back to [place] in 2026.    Son lives with her (part time, and also with his girlfriend)--in school for aviation.      Parents are staying with her since 12/2016 (from  [place]), father is  on treatment for lung cancer (started 12/2016).  Mom is on dialysis (related to HTN and DM)      Updated 11/2023   Social Drivers of Health   Tobacco Use: Low Risk (03/08/2024)   Patient History    Smoking Tobacco Use: Never    Smokeless Tobacco Use: Never    Passive Exposure: Not on file  Financial Resource Strain: Low Risk (04/26/2023)   Overall Financial Resource Strain (CARDIA)    Difficulty of Paying Living Expenses: Not very hard  Food Insecurity: Food Insecurity Present (04/26/2023)   Hunger Vital Sign    Worried About Running Out of Food in the Last Year: Sometimes true    Ran Out of Food in the Last Year: Sometimes true  Transportation Needs: No Transportation Needs (04/26/2023)   PRAPARE - Administrator, Civil Service (Medical): No    Lack of Transportation (Non-Medical): No  Physical Activity: Insufficiently Active (04/26/2023)   Exercise Vital Sign    Days of Exercise per Week: 3 days    Minutes of Exercise per Session: 30 min  Stress: No Stress Concern Present (04/26/2023)   [employer] of Occupational Health - Occupational Stress Questionnaire    Feeling of Stress : Only a little  Social Connections: Unknown (04/26/2023)   Social Connection and Isolation Panel    Frequency of Communication with Friends and Family: More than three times a week    Frequency of Social Gatherings with Friends and Family: More than three times a week    Attends Religious Services: Patient unable to answer    Active Member of Clubs or Organizations: No    Attends Banker Meetings: Never    Marital Status: Not on file  Intimate Partner Violence: Not At Risk (04/26/2023)   Humiliation, Afraid, Rape, and Kick questionnaire  Fear of Current or Ex-Partner: No    Emotionally Abused: No    Physically Abused: No    Sexually Abused: No  Depression (PHQ2-9): Low Risk (04/26/2023)   Depression (PHQ2-9)    PHQ-2 Score: 0  Alcohol Screen: Not on file  Housing: Low  Risk (04/26/2023)   Housing Stability Vital Sign    Unable to Pay for Housing in the Last Year: No    Number of Times Moved in the Last Year: 0    Homeless in the Last Year: No  Utilities: Not At Risk (04/26/2023)   [facility] Utilities    Threatened with loss of utilities: No  Health Literacy: Not on file    Review of Systems: All other review of systems negative except as mentioned in the HPI.  Physical Exam: Vital signs BP 108/71   Pulse 66   Temp (!) 97.2 F (36.2 C) (Temporal)   Ht 5' 8 (1.727 m)   Wt 246 lb (111.6 kg)   LMP 11/07/2012   SpO2 100%   BMI 37.40 kg/m   General:   Alert,  Well-developed, pleasant and cooperative in NAD Lungs:  Clear throughout to auscultation.   Heart:  Regular rate and rhythm Abdomen:  Soft, nontender and nondistended.   Neuro/Psych:  Alert and cooperative. Normal mood and affect. A and O x 3  Marcey Naval, MD [facility]

## 2024-03-08 NOTE — Op Note (Signed)
 [facility] Patient Name: Candace Walker Procedure Date: 03/08/2024 9:16 AM MRN: [id number] Endoscopist: Elspeth P. Leigh , MD, [id number] Age: 54 Referring MD:  Date of Birth: 05-Jan-1971 Gender: Female Account #: [id number] Procedure:                Colonoscopy Indications:              Colon cancer screening in patient at increased                            risk: Family history of 1st-degree relative with                            colon polyps before age 44 years (mother and sister                            with polyps at a young age (24s)) - details unknown Medicines:                Monitored Anesthesia Care Procedure:                Pre-Anesthesia Assessment:                           - Prior to the procedure, a History and Physical                            was performed, and patient medications and                            allergies were reviewed. The patient's tolerance of                            previous anesthesia was also reviewed. The risks                            and benefits of the procedure and the sedation                            options and risks were discussed with the patient.                            All questions were answered, and informed consent                            was obtained. Prior Anticoagulants: The patient has                            taken no anticoagulant or antiplatelet agents. ASA                            Grade Assessment: II - A patient with mild systemic                            disease. After reviewing the risks and benefits,  the patient was deemed in satisfactory condition to                            undergo the procedure.                           After obtaining informed consent, the colonoscope                            was passed under direct vision. Throughout the                            procedure, the patient's blood pressure, pulse, and                             oxygen saturations were monitored continuously. The                            CF HQ190L #[id number] was introduced through the anus                            and advanced to the the cecum, identified by                            appendiceal orifice and ileocecal valve. The                            colonoscopy was performed without difficulty. The                            patient tolerated the procedure well. The quality                            of the bowel preparation was adequate. The                            ileocecal valve, appendiceal orifice, and rectum                            were photographed. Scope In: 9:32:34 AM Scope Out: 9:47:24 AM Scope Withdrawal Time: 0 hours 7 minutes 32 seconds  Total Procedure Duration: 0 hours 14 minutes 50 seconds  Findings:                 The perianal and digital rectal examinations were                            normal.                           The colon was tortuous with some restricted                            mobility of the sigmoid colon.  Internal hemorrhoids were found during                            retroflexion. The hemorrhoids were small.                           The exam was otherwise without abnormality. Complications:            No immediate complications. Estimated blood loss:                            None. Estimated Blood Loss:     Estimated blood loss: none. Impression:               - Tortuous colon.                           - Internal hemorrhoids.                           - The examination was otherwise normal.                           - No specimens collected. Recommendation:           - Patient has a contact number available for                            emergencies. The signs and symptoms of potential                            delayed complications were discussed with the                            patient. Return to normal activities tomorrow.                             Written discharge instructions were provided to the                            patient.                           - Resume previous diet.                           - Continue present medications.                           - Repeat colonoscopy in 5 years for screening                            purposes given strong family history of polyps at a                            young age (unless we can obtain records to clarify  history otherwise). Elspeth P. Congetta Odriscoll, MD 03/08/2024 9:55:20 AM This report has been signed electronically.

## 2024-03-08 NOTE — Op Note (Signed)
 [facility] Patient Name: Candace Walker Procedure Date: 03/08/2024 9:17 AM MRN: [id number] Endoscopist: Elspeth P. Leigh , MD, [id number] Age: 54 Referring MD:  Date of Birth: 12/28/1970 Gender: Female Account #: [id number] Procedure:                Upper GI endoscopy Indications:              Dysphagia, Follow-up of gastro-esophageal reflux                            disease - now on protonix  40mg  / day with                            resolution of symptoms Medicines:                Monitored Anesthesia Care Procedure:                Pre-Anesthesia Assessment:                           - Prior to the procedure, a History and Physical                            was performed, and patient medications and                            allergies were reviewed. The patient's tolerance of                            previous anesthesia was also reviewed. The risks                            and benefits of the procedure and the sedation                            options and risks were discussed with the patient.                            All questions were answered, and informed consent                            was obtained. Prior Anticoagulants: The patient has                            taken no anticoagulant or antiplatelet agents. ASA                            Grade Assessment: II - A patient with mild systemic                            disease. After reviewing the risks and benefits,                            the patient was deemed in satisfactory condition to  undergo the procedure.                           After obtaining informed consent, the endoscope was                            passed under direct vision. Throughout the                            procedure, the patient's blood pressure, pulse, and                            oxygen saturations were monitored continuously. The                            Olympus scope [id number] was  introduced through the                            mouth, and advanced to the second part of duodenum.                            The upper GI endoscopy was accomplished without                            difficulty. The patient tolerated the procedure                            well. Scope In: Scope Out: Findings:                 Esophagogastric landmarks were identified: the                            Z-line was found at 40 cm, the gastroesophageal                            junction was found at 40 cm and the upper extent of                            the gastric folds was found at 41 cm from the                            incisors.                           A 1 cm hiatal hernia was present.                           The exam of the esophagus was otherwise normal. No                            focal stenosis or stricture seen. Given resolution                            of dysphagia with PPI, no  empiric dilation                            performed.                           Patchy mildly erythematous mucosa was found in the                            gastric antrum.                           The exam of the stomach was otherwise normal.                           Biopsies were taken with a cold forceps for                            Helicobacter pylori testing.                           The examined duodenum was normal. Complications:            No immediate complications. Estimated blood loss:                            Minimal. Estimated Blood Loss:     Estimated blood loss was minimal. Impression:               - Esophagogastric landmarks identified.                           - 1 cm hiatal hernia.                           - Normal esophagus otherwise - no focal stenosis /                            stricture, no BE.                           - Erythematous mucosa in the antrum.                           - Normal stomach otherwise - biopsies taken to rule                             out H pylori.                           - Normal examined duodenum. Recommendation:           - Patient has a contact number available for                            emergencies. The signs and symptoms of potential  delayed complications were discussed with the                            patient. Return to normal activities tomorrow.                            Written discharge instructions were provided to the                            patient.                           - Resume previous diet.                           - Continue present medications.                           - Continue protonix  as needed. Long term use lowest                            dose needed to control symptoms                           - Await pathology results. Elspeth P. Kazuko Clemence, MD 03/08/2024 9:31:30 AM This report has been signed electronically.

## 2024-03-09 ENCOUNTER — Telehealth: Payer: Self-pay

## 2024-03-09 NOTE — Telephone Encounter (Signed)
 No answer on follow up call - voice mail message left

## 2024-03-13 ENCOUNTER — Ambulatory Visit: Payer: Self-pay | Admitting: Gastroenterology

## 2024-03-13 LAB — SURGICAL PATHOLOGY

## 2024-03-20 ENCOUNTER — Other Ambulatory Visit (HOSPITAL_COMMUNITY): Payer: Self-pay

## 2024-03-21 ENCOUNTER — Other Ambulatory Visit (HOSPITAL_COMMUNITY): Payer: Self-pay

## 2024-05-15 ENCOUNTER — Encounter: Payer: Self-pay | Admitting: Family Medicine
